# Patient Record
Sex: Female | Born: 1945 | Race: White | Hispanic: No | Marital: Married | Smoking: Former smoker
Health system: Southern US, Community
[De-identification: ages and names within clinical notes are randomized; demographics above are authoritative.]

## PROBLEM LIST (undated history)

## (undated) DIAGNOSIS — F419 Anxiety disorder, unspecified: Secondary | ICD-10-CM

## (undated) DIAGNOSIS — E785 Hyperlipidemia, unspecified: Secondary | ICD-10-CM

## (undated) DIAGNOSIS — N390 Urinary tract infection, site not specified: Secondary | ICD-10-CM

## (undated) DIAGNOSIS — C50919 Malignant neoplasm of unspecified site of unspecified female breast: Secondary | ICD-10-CM

## (undated) DIAGNOSIS — D649 Anemia, unspecified: Secondary | ICD-10-CM

## (undated) DIAGNOSIS — R739 Hyperglycemia, unspecified: Secondary | ICD-10-CM

## (undated) DIAGNOSIS — K219 Gastro-esophageal reflux disease without esophagitis: Secondary | ICD-10-CM

## (undated) DIAGNOSIS — R011 Cardiac murmur, unspecified: Secondary | ICD-10-CM

## (undated) DIAGNOSIS — T300 Burn of unspecified body region, unspecified degree: Secondary | ICD-10-CM

## (undated) DIAGNOSIS — C20 Malignant neoplasm of rectum: Secondary | ICD-10-CM

## (undated) DIAGNOSIS — C801 Malignant (primary) neoplasm, unspecified: Secondary | ICD-10-CM

## (undated) DIAGNOSIS — I10 Essential (primary) hypertension: Secondary | ICD-10-CM

## (undated) DIAGNOSIS — E039 Hypothyroidism, unspecified: Secondary | ICD-10-CM

## (undated) DIAGNOSIS — Z923 Personal history of irradiation: Secondary | ICD-10-CM

## (undated) DIAGNOSIS — Z9221 Personal history of antineoplastic chemotherapy: Secondary | ICD-10-CM

## (undated) DIAGNOSIS — E559 Vitamin D deficiency, unspecified: Secondary | ICD-10-CM

## (undated) DIAGNOSIS — E871 Hypo-osmolality and hyponatremia: Secondary | ICD-10-CM

## (undated) HISTORY — PX: BREAST EXCISIONAL BIOPSY: SUR124

## (undated) HISTORY — PX: TUBAL LIGATION: SHX77

## (undated) HISTORY — DX: Hyperlipidemia, unspecified: E78.5

## (undated) HISTORY — DX: Malignant neoplasm of unspecified site of unspecified female breast: C50.919

## (undated) HISTORY — DX: Hypothyroidism, unspecified: E03.9

## (undated) HISTORY — DX: Urinary tract infection, site not specified: N39.0

## (undated) HISTORY — DX: Hyperglycemia, unspecified: R73.9

## (undated) HISTORY — PX: ABDOMINAL HYSTERECTOMY: SHX81

## (undated) HISTORY — DX: Essential (primary) hypertension: I10

## (undated) HISTORY — PX: BREAST LUMPECTOMY: SHX2

## (undated) HISTORY — PX: APPENDECTOMY: SHX54

## (undated) HISTORY — DX: Anxiety disorder, unspecified: F41.9

## (undated) HISTORY — DX: Vitamin D deficiency, unspecified: E55.9

## (undated) HISTORY — PX: ECTOPIC PREGNANCY SURGERY: SHX613

---
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
## 1999-11-12 ENCOUNTER — Other Ambulatory Visit: Admission: RE | Admit: 1999-11-12 | Discharge: 1999-12-13 | Payer: Self-pay | Admitting: Family Medicine

## 2005-05-20 ENCOUNTER — Ambulatory Visit: Payer: Self-pay | Admitting: Family Medicine

## 2011-12-29 DIAGNOSIS — I10 Essential (primary) hypertension: Secondary | ICD-10-CM | POA: Diagnosis present

## 2011-12-29 DIAGNOSIS — E039 Hypothyroidism, unspecified: Secondary | ICD-10-CM | POA: Diagnosis present

## 2011-12-29 DIAGNOSIS — E782 Mixed hyperlipidemia: Secondary | ICD-10-CM | POA: Diagnosis present

## 2013-10-17 DIAGNOSIS — R739 Hyperglycemia, unspecified: Secondary | ICD-10-CM | POA: Insufficient documentation

## 2013-10-17 DIAGNOSIS — E559 Vitamin D deficiency, unspecified: Secondary | ICD-10-CM | POA: Diagnosis present

## 2015-12-19 DIAGNOSIS — I10 Essential (primary) hypertension: Secondary | ICD-10-CM | POA: Diagnosis not present

## 2015-12-19 DIAGNOSIS — E782 Mixed hyperlipidemia: Secondary | ICD-10-CM | POA: Diagnosis not present

## 2016-01-16 DIAGNOSIS — R030 Elevated blood-pressure reading, without diagnosis of hypertension: Secondary | ICD-10-CM | POA: Diagnosis not present

## 2016-01-16 DIAGNOSIS — E782 Mixed hyperlipidemia: Secondary | ICD-10-CM | POA: Diagnosis not present

## 2016-01-16 DIAGNOSIS — I10 Essential (primary) hypertension: Secondary | ICD-10-CM | POA: Diagnosis not present

## 2016-03-12 DIAGNOSIS — E785 Hyperlipidemia, unspecified: Secondary | ICD-10-CM | POA: Diagnosis not present

## 2016-04-29 DIAGNOSIS — Z1231 Encounter for screening mammogram for malignant neoplasm of breast: Secondary | ICD-10-CM | POA: Diagnosis not present

## 2016-05-19 DIAGNOSIS — D225 Melanocytic nevi of trunk: Secondary | ICD-10-CM | POA: Diagnosis not present

## 2016-05-19 DIAGNOSIS — L821 Other seborrheic keratosis: Secondary | ICD-10-CM | POA: Diagnosis not present

## 2016-06-08 DIAGNOSIS — E785 Hyperlipidemia, unspecified: Secondary | ICD-10-CM | POA: Diagnosis not present

## 2016-06-30 DIAGNOSIS — H5203 Hypermetropia, bilateral: Secondary | ICD-10-CM | POA: Diagnosis not present

## 2016-06-30 DIAGNOSIS — H25013 Cortical age-related cataract, bilateral: Secondary | ICD-10-CM | POA: Diagnosis not present

## 2016-06-30 DIAGNOSIS — H2513 Age-related nuclear cataract, bilateral: Secondary | ICD-10-CM | POA: Diagnosis not present

## 2016-09-22 DIAGNOSIS — Z23 Encounter for immunization: Secondary | ICD-10-CM | POA: Diagnosis not present

## 2016-09-22 DIAGNOSIS — L820 Inflamed seborrheic keratosis: Secondary | ICD-10-CM | POA: Diagnosis not present

## 2016-09-22 DIAGNOSIS — D485 Neoplasm of uncertain behavior of skin: Secondary | ICD-10-CM | POA: Diagnosis not present

## 2016-12-28 DIAGNOSIS — Z1159 Encounter for screening for other viral diseases: Secondary | ICD-10-CM | POA: Diagnosis not present

## 2016-12-28 DIAGNOSIS — E782 Mixed hyperlipidemia: Secondary | ICD-10-CM | POA: Diagnosis not present

## 2016-12-28 DIAGNOSIS — E039 Hypothyroidism, unspecified: Secondary | ICD-10-CM | POA: Diagnosis not present

## 2016-12-28 DIAGNOSIS — E559 Vitamin D deficiency, unspecified: Secondary | ICD-10-CM | POA: Diagnosis not present

## 2016-12-28 DIAGNOSIS — I10 Essential (primary) hypertension: Secondary | ICD-10-CM | POA: Diagnosis not present

## 2016-12-31 DIAGNOSIS — E782 Mixed hyperlipidemia: Secondary | ICD-10-CM | POA: Diagnosis not present

## 2016-12-31 DIAGNOSIS — E039 Hypothyroidism, unspecified: Secondary | ICD-10-CM | POA: Diagnosis not present

## 2016-12-31 DIAGNOSIS — Z1159 Encounter for screening for other viral diseases: Secondary | ICD-10-CM | POA: Diagnosis not present

## 2016-12-31 DIAGNOSIS — I10 Essential (primary) hypertension: Secondary | ICD-10-CM | POA: Diagnosis not present

## 2016-12-31 DIAGNOSIS — Z0000 Encounter for general adult medical examination without abnormal findings: Secondary | ICD-10-CM | POA: Diagnosis not present

## 2017-01-17 DIAGNOSIS — Z1211 Encounter for screening for malignant neoplasm of colon: Secondary | ICD-10-CM | POA: Diagnosis not present

## 2017-05-17 DIAGNOSIS — Z1231 Encounter for screening mammogram for malignant neoplasm of breast: Secondary | ICD-10-CM | POA: Diagnosis not present

## 2017-05-18 DIAGNOSIS — L821 Other seborrheic keratosis: Secondary | ICD-10-CM | POA: Diagnosis not present

## 2017-05-18 DIAGNOSIS — D225 Melanocytic nevi of trunk: Secondary | ICD-10-CM | POA: Diagnosis not present

## 2017-05-26 DIAGNOSIS — R928 Other abnormal and inconclusive findings on diagnostic imaging of breast: Secondary | ICD-10-CM | POA: Diagnosis not present

## 2017-05-26 DIAGNOSIS — N6313 Unspecified lump in the right breast, lower outer quadrant: Secondary | ICD-10-CM | POA: Diagnosis not present

## 2017-06-01 ENCOUNTER — Other Ambulatory Visit: Payer: Self-pay

## 2017-06-01 DIAGNOSIS — R5381 Other malaise: Secondary | ICD-10-CM

## 2017-06-03 ENCOUNTER — Other Ambulatory Visit: Payer: Self-pay | Admitting: Nurse Practitioner

## 2017-06-03 DIAGNOSIS — R5381 Other malaise: Secondary | ICD-10-CM

## 2017-12-19 DIAGNOSIS — R928 Other abnormal and inconclusive findings on diagnostic imaging of breast: Secondary | ICD-10-CM | POA: Diagnosis not present

## 2017-12-19 DIAGNOSIS — N6489 Other specified disorders of breast: Secondary | ICD-10-CM | POA: Diagnosis not present

## 2017-12-29 DIAGNOSIS — Z0000 Encounter for general adult medical examination without abnormal findings: Secondary | ICD-10-CM | POA: Diagnosis not present

## 2018-01-03 DIAGNOSIS — I10 Essential (primary) hypertension: Secondary | ICD-10-CM | POA: Diagnosis not present

## 2018-01-03 DIAGNOSIS — Z1211 Encounter for screening for malignant neoplasm of colon: Secondary | ICD-10-CM | POA: Diagnosis not present

## 2018-01-03 DIAGNOSIS — Z0000 Encounter for general adult medical examination without abnormal findings: Secondary | ICD-10-CM | POA: Diagnosis not present

## 2018-01-03 DIAGNOSIS — E782 Mixed hyperlipidemia: Secondary | ICD-10-CM | POA: Diagnosis not present

## 2018-01-03 DIAGNOSIS — E039 Hypothyroidism, unspecified: Secondary | ICD-10-CM | POA: Diagnosis not present

## 2018-04-20 ENCOUNTER — Other Ambulatory Visit: Payer: Self-pay | Admitting: Nurse Practitioner

## 2018-04-20 DIAGNOSIS — E2839 Other primary ovarian failure: Secondary | ICD-10-CM

## 2018-05-30 DIAGNOSIS — L821 Other seborrheic keratosis: Secondary | ICD-10-CM | POA: Diagnosis not present

## 2018-05-30 DIAGNOSIS — D235 Other benign neoplasm of skin of trunk: Secondary | ICD-10-CM | POA: Diagnosis not present

## 2018-05-30 DIAGNOSIS — D225 Melanocytic nevi of trunk: Secondary | ICD-10-CM | POA: Diagnosis not present

## 2018-05-31 ENCOUNTER — Ambulatory Visit
Admission: RE | Admit: 2018-05-31 | Discharge: 2018-05-31 | Disposition: A | Discharge status: Home / Self Care | Payer: PPO | Source: Ambulatory Visit | Attending: Nurse Practitioner | Admitting: Nurse Practitioner

## 2018-05-31 DIAGNOSIS — E2839 Other primary ovarian failure: Secondary | ICD-10-CM

## 2018-05-31 DIAGNOSIS — M8588 Other specified disorders of bone density and structure, other site: Secondary | ICD-10-CM | POA: Diagnosis not present

## 2018-05-31 DIAGNOSIS — Z780 Asymptomatic menopausal state: Secondary | ICD-10-CM | POA: Diagnosis not present

## 2018-06-28 DIAGNOSIS — E782 Mixed hyperlipidemia: Secondary | ICD-10-CM | POA: Diagnosis not present

## 2018-06-28 DIAGNOSIS — E039 Hypothyroidism, unspecified: Secondary | ICD-10-CM | POA: Diagnosis not present

## 2018-06-28 DIAGNOSIS — I10 Essential (primary) hypertension: Secondary | ICD-10-CM | POA: Diagnosis not present

## 2018-07-04 DIAGNOSIS — E039 Hypothyroidism, unspecified: Secondary | ICD-10-CM | POA: Diagnosis not present

## 2018-07-04 DIAGNOSIS — E782 Mixed hyperlipidemia: Secondary | ICD-10-CM | POA: Diagnosis not present

## 2018-07-04 DIAGNOSIS — I10 Essential (primary) hypertension: Secondary | ICD-10-CM | POA: Diagnosis not present

## 2018-07-05 ENCOUNTER — Other Ambulatory Visit: Payer: Self-pay | Admitting: Nurse Practitioner

## 2018-07-05 DIAGNOSIS — Z1231 Encounter for screening mammogram for malignant neoplasm of breast: Secondary | ICD-10-CM

## 2018-07-27 ENCOUNTER — Encounter: Payer: Self-pay | Admitting: Radiology

## 2018-07-27 ENCOUNTER — Ambulatory Visit
Admission: RE | Admit: 2018-07-27 | Discharge: 2018-07-27 | Disposition: A | Discharge status: Home / Self Care | Payer: PPO | Source: Ambulatory Visit | Attending: Nurse Practitioner | Admitting: Nurse Practitioner

## 2018-07-27 DIAGNOSIS — Z1231 Encounter for screening mammogram for malignant neoplasm of breast: Secondary | ICD-10-CM | POA: Diagnosis not present

## 2018-12-08 DIAGNOSIS — J029 Acute pharyngitis, unspecified: Secondary | ICD-10-CM | POA: Diagnosis not present

## 2018-12-08 DIAGNOSIS — R6889 Other general symptoms and signs: Secondary | ICD-10-CM | POA: Diagnosis not present

## 2018-12-08 DIAGNOSIS — B9789 Other viral agents as the cause of diseases classified elsewhere: Secondary | ICD-10-CM | POA: Diagnosis not present

## 2018-12-08 DIAGNOSIS — J069 Acute upper respiratory infection, unspecified: Secondary | ICD-10-CM | POA: Diagnosis not present

## 2018-12-29 DIAGNOSIS — I10 Essential (primary) hypertension: Secondary | ICD-10-CM | POA: Diagnosis not present

## 2018-12-29 DIAGNOSIS — R739 Hyperglycemia, unspecified: Secondary | ICD-10-CM | POA: Diagnosis not present

## 2018-12-29 DIAGNOSIS — E782 Mixed hyperlipidemia: Secondary | ICD-10-CM | POA: Diagnosis not present

## 2018-12-29 DIAGNOSIS — E039 Hypothyroidism, unspecified: Secondary | ICD-10-CM | POA: Diagnosis not present

## 2019-01-04 DIAGNOSIS — E039 Hypothyroidism, unspecified: Secondary | ICD-10-CM | POA: Diagnosis not present

## 2019-01-04 DIAGNOSIS — R195 Other fecal abnormalities: Secondary | ICD-10-CM | POA: Diagnosis not present

## 2019-01-04 DIAGNOSIS — Z0000 Encounter for general adult medical examination without abnormal findings: Secondary | ICD-10-CM | POA: Diagnosis not present

## 2019-01-04 DIAGNOSIS — E782 Mixed hyperlipidemia: Secondary | ICD-10-CM | POA: Diagnosis not present

## 2019-01-04 DIAGNOSIS — I10 Essential (primary) hypertension: Secondary | ICD-10-CM | POA: Diagnosis not present

## 2019-01-08 DIAGNOSIS — R195 Other fecal abnormalities: Secondary | ICD-10-CM | POA: Diagnosis not present

## 2019-01-31 ENCOUNTER — Ambulatory Visit (INDEPENDENT_AMBULATORY_CARE_PROVIDER_SITE_OTHER): Payer: PPO | Admitting: Gastroenterology

## 2019-01-31 ENCOUNTER — Encounter: Payer: Self-pay | Admitting: Gastroenterology

## 2019-01-31 ENCOUNTER — Ambulatory Visit: Payer: PPO | Admitting: Gastroenterology

## 2019-01-31 VITALS — BP 184/76 | HR 100 | Ht 67.0 in | Wt 147.6 lb

## 2019-01-31 DIAGNOSIS — R195 Other fecal abnormalities: Secondary | ICD-10-CM

## 2019-01-31 MED ORDER — PEG 3350-KCL-NA BICARB-NACL 420 G PO SOLR: 4000.0000 mL | ORAL | 0 refills | Status: DC

## 2019-01-31 NOTE — Patient Instructions (Addendum)
You will be set up for a colonoscopy for FOBT + stools.  Thank you for entrusting me with your care and choosing [facility].  Dr Ardis Hughs

## 2019-01-31 NOTE — Progress Notes (Signed)
HPI: This is a very pleasant 73 year old woman who was referred to me by Chesley Noon, MD  to evaluate Hemoccult positive stool.    Chief complaint is Hemoccult positive stool  She has mild chronic constipation for which she takes magnesium pill once daily.  This is been going on for many years.  Might have been a little bit worse over the holidays.  She never sees overt bleeding.  On a recent annual physical she was found to have Hemoccult positive stool.  She has no significant abdominal pains.  Her weight has been overall stable.  Colon cancer does not run in her family  Old Data Reviewed: Blood test January 2020 show normal CBC, normal complete metabolic profile  Fecal occult blood test January 2020 was positive.    Review of systems: Pertinent positive and negative review of systems were noted in the above HPI section. All other review negative.   Past Medical History:  Diagnosis Date  . Benign essential HTN   . Hyperglycemia   . Hyperlipemia   . Hypothyroidism   . Vitamin D deficiency   . White coat syndrome with diagnosis of hypertension     Past Surgical History:  Procedure Laterality Date  . BREAST EXCISIONAL BIOPSY Bilateral    benign    Current Outpatient Medications  Medication Sig Dispense Refill  . amLODipine (NORVASC) 5 MG tablet Take 5 mg by mouth daily.    . Ascorbic Acid (VITAMIN C) 100 MG tablet Take 100 mg by mouth daily.    Marland Kitchen aspirin EC 81 MG tablet Take 81 mg by mouth daily.    . Biotin 1 MG CAPS Take by mouth.    . calcium-vitamin D (OSCAL WITH D) 500-200 MG-UNIT tablet Take 1 tablet by mouth.    . folic acid (FOLVITE) 1 MG tablet Take 1 mg by mouth daily.    Marland Kitchen L-Theanine 100 MG CAPS Take by mouth.    . levothyroxine (SYNTHROID, LEVOTHROID) 125 MCG tablet Take 125 mcg by mouth daily before breakfast.    . losartan (COZAAR) 100 MG tablet Take 100 mg by mouth daily.    . magnesium 30 MG tablet Take 30 mg by mouth 2 (two) times daily.     No  current facility-administered medications for this visit.     Allergies as of 01/31/2019 - Review Complete 01/31/2019  Allergen Reaction Noted  . Metoprolol  01/26/2019  . Statins  01/26/2019    Family History  Problem Relation Age of Onset  . Lung cancer Mother   . Diabetes Father   . Heart disease Father     Social History   Socioeconomic History  . Marital status: Married    Spouse name: Not on file  . Number of children: 1  . Years of education: Not on file  . Highest education level: Not on file  Occupational History  . Occupation: Retired Animal nutritionist  . Financial resource strain: Not on file  . Food insecurity:    Worry: Not on file    Inability: Not on file  . Transportation needs:    Medical: Not on file    Non-medical: Not on file  Tobacco Use  . Smoking status: Never Smoker  . Smokeless tobacco: Never Used  Substance and Sexual Activity  . Alcohol use: Yes    Alcohol/week: 7.0 standard drinks    Types: 7 Glasses of wine per week  . Drug use: Never  . Sexual activity: Not on file  Lifestyle  . Physical activity:    Days per week: Not on file    Minutes per session: Not on file  . Stress: Not on file  Relationships  . Social connections:    Talks on phone: Not on file    Gets together: Not on file    Attends religious service: Not on file    Active member of club or organization: Not on file    Attends meetings of clubs or organizations: Not on file    Relationship status: Not on file  . Intimate partner violence:    Fear of current or ex partner: Not on file    Emotionally abused: Not on file    Physically abused: Not on file    Forced sexual activity: Not on file  Other Topics Concern  . Not on file  Social History Narrative  . Not on file     Physical Exam: BP (!) 184/76   Pulse 100   Ht 5\' 7"  (1.702 m)   Wt 147 lb 9.6 oz (67 kg)   BMI 23.12 kg/m  Constitutional: generally well-appearing Psychiatric: alert and oriented  x3 Eyes: extraocular movements intact Mouth: oral pharynx moist, no lesions Neck: supple no lymphadenopathy Cardiovascular: heart regular rate and rhythm Lungs: clear to auscultation bilaterally Abdomen: soft, nontender, nondistended, no obvious ascites, no peritoneal signs, normal bowel sounds Extremities: no lower extremity edema bilaterally Skin: no lesions on visible extremities   Assessment and plan: 73 y.o. female with Hemoccult positive stool  She is not anemic, she has not been losing weight.  She does not have a family history of colon cancer.  I think it is unlikely that she has any serious cause of her Hemoccult positive stool but certainly she deserves further work-up with a full colonoscopy at her soonest convenience.  I see no reason for any further blood tests or imaging studies prior to then.    Please see the "Patient Instructions" section for addition details about the plan.   Owens Loffler, MD [facility] 01/31/2019, 11:23 AM  Cc: Chesley Noon, MD

## 2019-02-11 HISTORY — PX: COLONOSCOPY: SHX174

## 2019-02-27 ENCOUNTER — Telehealth: Payer: Self-pay | Admitting: *Deleted

## 2019-02-27 NOTE — Telephone Encounter (Signed)
Covid-19 travel screening questions  Have you traveled in the last 14 days? No If yes where?  Do you now or have you had a fever in the last 14 days? No  Do you have any respiratory symptoms of shortness of breath or cough now or in the last 14 days? No  Do you have any family members or close contacts with diagnosed or suspected Covid-19? No       

## 2019-02-28 ENCOUNTER — Ambulatory Visit (AMBULATORY_SURGERY_CENTER): Payer: PPO | Admitting: Gastroenterology

## 2019-02-28 ENCOUNTER — Other Ambulatory Visit: Payer: Self-pay | Admitting: Gastroenterology

## 2019-02-28 ENCOUNTER — Encounter: Payer: Self-pay | Admitting: Gastroenterology

## 2019-02-28 ENCOUNTER — Other Ambulatory Visit (INDEPENDENT_AMBULATORY_CARE_PROVIDER_SITE_OTHER): Payer: PPO

## 2019-02-28 ENCOUNTER — Other Ambulatory Visit: Payer: Self-pay

## 2019-02-28 ENCOUNTER — Other Ambulatory Visit: Payer: PPO

## 2019-02-28 VITALS — BP 199/94 | HR 81 | Temp 97.8°F | Resp 12 | Ht 67.0 in | Wt 147.0 lb

## 2019-02-28 DIAGNOSIS — D128 Benign neoplasm of rectum: Secondary | ICD-10-CM

## 2019-02-28 DIAGNOSIS — D129 Benign neoplasm of anus and anal canal: Secondary | ICD-10-CM

## 2019-02-28 DIAGNOSIS — C20 Malignant neoplasm of rectum: Secondary | ICD-10-CM

## 2019-02-28 DIAGNOSIS — R195 Other fecal abnormalities: Secondary | ICD-10-CM | POA: Diagnosis not present

## 2019-02-28 LAB — CREATININE, SERUM: Creatinine, Ser: 0.51 mg/dL (ref 0.40–1.20)

## 2019-02-28 LAB — BUN: BUN: 8 mg/dL (ref 6–23)

## 2019-02-28 MED ORDER — SODIUM CHLORIDE 0.9 % IV SOLN: 500.0000 mL | Freq: Once | INTRAVENOUS | Status: DC

## 2019-02-28 NOTE — Progress Notes (Signed)
To PACU, VSS. Report to [email or web address] 

## 2019-02-28 NOTE — Progress Notes (Signed)
Called to room to assist during endoscopic procedure.  Patient ID and intended procedure confirmed with present staff. Received instructions for my participation in the procedure from the performing physician.  

## 2019-02-28 NOTE — Patient Instructions (Signed)
Creatinine order put in, patient going to have drawn before d/c, sent home with CT info and 2 bottles of contrast  YOU HAD AN ENDOSCOPIC PROCEDURE TODAY AT [facility] [place]:   Refer to the procedure report that was given to you for any specific questions about what was found during the examination.  If the procedure report does not answer your questions, please call your gastroenterologist to clarify.  If you requested that your care partner not be given the details of your procedure findings, then the procedure report has been included in a sealed envelope for you to review at your convenience later.  YOU SHOULD EXPECT: Some feelings of bloating in the abdomen. Passage of more gas than usual.  Walking can help get rid of the air that was put into your GI tract during the procedure and reduce the bloating. If you had a lower endoscopy (such as a colonoscopy or flexible sigmoidoscopy) you may notice spotting of blood in your stool or on the toilet paper. If you underwent a bowel prep for your procedure, you may not have a normal bowel movement for a few days.  Please Note:  You might notice some irritation and congestion in your nose or some drainage.  This is from the oxygen used during your procedure.  There is no need for concern and it should clear up in a day or so.  SYMPTOMS TO REPORT IMMEDIATELY:   Following lower endoscopy (colonoscopy or flexible sigmoidoscopy):  Excessive amounts of blood in the stool  Significant tenderness or worsening of abdominal pains  Swelling of the abdomen that is new, acute  Fever of 100F or higher  For urgent or emergent issues, a gastroenterologist can be reached at any hour by calling [phone].   DIET:  We do recommend a small meal at first, but then you may proceed to your regular diet.  Drink plenty of fluids but you should avoid alcoholic beverages for 24 hours.  ACTIVITY:  You should plan to take it easy for the rest of today and you  should NOT DRIVE or use heavy machinery until tomorrow (because of the sedation medicines used during the test).    FOLLOW UP: Our staff will call the number listed on your records the next business day following your procedure to check on you and address any questions or concerns that you may have regarding the information given to you following your procedure. If we do not reach you, we will leave a message.  However, if you are feeling well and you are not experiencing any problems, there is no need to return our call.  We will assume that you have returned to your regular daily activities without incident.  If any biopsies were taken you will be contacted by phone or by letter within the next 1-3 weeks.  Please call us at [phone] if you have not heard about the biopsies in 3 weeks.    SIGNATURES/CONFIDENTIALITY: You and/or your care partner have signed paperwork which will be entered into your electronic medical record.  These signatures attest to the fact that that the information above on your After Visit Summary has been reviewed and is understood.  Full responsibility of the confidentiality of this discharge information lies with you and/or your care-partner.

## 2019-02-28 NOTE — Op Note (Signed)
[facility] Patient Name: Kathleen Flores Procedure Date: 02/28/2019 10:46 AM MRN: [id number] Endoscopist: Milus Banister , MD Age: 73 Referring MD:  Date of Birth: 03/24/1946 Gender: Female Account #: [id number] Procedure:                Colonoscopy Indications:              Heme positive stool Medicines:                Monitored Anesthesia Care Procedure:                Pre-Anesthesia Assessment:                           - Prior to the procedure, a History and Physical                            was performed, and patient medications and                            allergies were reviewed. The patient's tolerance of                            previous anesthesia was also reviewed. The risks                            and benefits of the procedure and the sedation                            options and risks were discussed with the patient.                            All questions were answered, and informed consent                            was obtained. Prior Anticoagulants: The patient has                            taken no previous anticoagulant or antiplatelet                            agents. ASA Grade Assessment: II - A patient with                            mild systemic disease. After reviewing the risks                            and benefits, the patient was deemed in                            satisfactory condition to undergo the procedure.                           After obtaining informed consent, the colonoscope  was passed under direct vision. Throughout the                            procedure, the patient's blood pressure, pulse, and                            oxygen saturations were monitored continuously. The                            Colonoscope was introduced through the anus and                            advanced to the the cecum, identified by                            appendiceal orifice and ileocecal valve. The                          colonoscopy was performed without difficulty. The                            patient tolerated the procedure well. The quality                            of the bowel preparation was good. The ileocecal                            valve, appendiceal orifice, and rectum were                            photographed. Scope In: 10:52:45 AM Scope Out: 70:62:37 AM Scope Withdrawal Time: 0 hours 15 minutes 6 seconds  Total Procedure Duration: 0 hours 23 minutes 33 seconds  Findings:                 An ulcerated non-obstructing small mass was found                            in the mid rectum. The mass was partially                            circumferential (1/4 of the lumen circumference).                            The mass measured 2cm across and the distal edge is                            located 7cm from the anal verge. Biopsies were                            taken with a cold forceps for histology. Area was                            tattooed with an injection of Spot (carbon black)  in two locations immediately lateral to the lesion.                           The exam was otherwise without abnormality on                            direct and retroflexion views. Complications:            No immediate complications. Estimated blood loss:                            None. Estimated Blood Loss:     Estimated blood loss: none. 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 This was biopsied extensively and                            the site was labeled with SPOT injection.                           - The examination was otherwise normal on direct                            and retroflexion views. Recommendation:           - Patient has a contact number available for                            emergencies. The signs and symptoms of potential                             delayed complications were discussed with the                            patient. Return to normal activities tomorrow.                            Written discharge instructions were provided to the                            patient.                           - Resume previous diet.                           - Continue present medications.                           - Repeat colonoscopy in 1 year for surveillance.                           - My office will arrange staging workup (CT scan  chest/abd/pelvis with IV and oral contrast, CEA                            level) and will begin referral to [facility] to                            consider resection.                           - Pathology sent 'rush' Milus Banister, MD 02/28/2019 11:23:24 AM This report has been signed electronically.

## 2019-03-01 ENCOUNTER — Telehealth: Payer: Self-pay | Admitting: *Deleted

## 2019-03-01 NOTE — Telephone Encounter (Signed)
  Follow up Call-  Call back number 02/28/2019  Post procedure Call Back phone  # [phone]  Permission to leave phone message Yes  Some recent data might be hidden     Patient questions:  Do you have a fever, pain , or abdominal swelling? No. Pain Score  0 *  Have you tolerated food without any problems? Yes.    Have you been able to return to your normal activities? Yes.    Do you have any questions about your discharge instructions: Diet   No. Medications  No. Follow up visit  No.  Do you have questions or concerns about your Care? No.  Actions: * If pain score is 4 or above: No action needed, pain <4.  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 She requested me to have that noted.  I thanked her

## 2019-03-02 ENCOUNTER — Other Ambulatory Visit: Payer: PPO

## 2019-03-02 ENCOUNTER — Encounter: Payer: Self-pay | Admitting: Gastroenterology

## 2019-03-02 ENCOUNTER — Other Ambulatory Visit: Payer: Self-pay

## 2019-03-02 ENCOUNTER — Telehealth: Payer: Self-pay | Admitting: Gastroenterology

## 2019-03-02 DIAGNOSIS — C20 Malignant neoplasm of rectum: Secondary | ICD-10-CM

## 2019-03-02 NOTE — Telephone Encounter (Signed)
Pt called back said that a friend of her's recommended Dr. Georgette Dover or Dr. Rosendo Gros from [facility].

## 2019-03-02 NOTE — Telephone Encounter (Signed)
Following a telephone conversation with Dr. Ardis Hughs, pt informed that she is in network with [facility].

## 2019-03-02 NOTE — Telephone Encounter (Signed)
error 

## 2019-03-05 ENCOUNTER — Encounter (INDEPENDENT_AMBULATORY_CARE_PROVIDER_SITE_OTHER): Payer: Self-pay

## 2019-03-05 ENCOUNTER — Other Ambulatory Visit: Payer: Self-pay

## 2019-03-05 ENCOUNTER — Ambulatory Visit (INDEPENDENT_AMBULATORY_CARE_PROVIDER_SITE_OTHER)
Admission: RE | Admit: 2019-03-05 | Discharge: 2019-03-05 | Disposition: A | Discharge status: Home / Self Care | Payer: PPO | Source: Ambulatory Visit | Attending: Gastroenterology | Admitting: Gastroenterology

## 2019-03-05 DIAGNOSIS — R918 Other nonspecific abnormal finding of lung field: Secondary | ICD-10-CM | POA: Diagnosis not present

## 2019-03-05 DIAGNOSIS — C20 Malignant neoplasm of rectum: Secondary | ICD-10-CM

## 2019-03-05 LAB — CEA: CEA: 0.8 ng/mL

## 2019-03-05 MED ORDER — IOHEXOL 300 MG/ML  SOLN
100.0000 mL | Freq: Once | INTRAMUSCULAR | Status: AC | PRN
  Administered 2019-03-05: 100 mL via INTRAVENOUS

## 2019-03-05 NOTE — Telephone Encounter (Signed)
noted 

## 2019-03-05 NOTE — Telephone Encounter (Signed)
Pt called would like for you to know that Dr. Michael Boston is going to be her surgeon at [facility] and if any notes can be sent to him.

## 2019-03-06 ENCOUNTER — Ambulatory Visit: Payer: Self-pay | Admitting: Surgery

## 2019-03-06 DIAGNOSIS — C20 Malignant neoplasm of rectum: Secondary | ICD-10-CM | POA: Diagnosis not present

## 2019-03-06 NOTE — H&P (Signed)
[name] Documented: 03/06/2019 8:43 AM Location: [facility] Patient #: [id number] DOB: 04/20/1946 Married / Language: [name] / Race: White Female  History of Present Illness Adin Hector MD; 03/06/2019 9:35 AM) The patient is a 73 year old female who presents with colorectal cancer. Note for "Colorectal cancer": ` ` ` Patient sent for surgical consultation at the request of Dr Dalene Carrow GI  Chief Complaint: Rectal cancer. ` ` The patient is a pleasant active woman that was found to be Hemoccult positive on annual visit. Underwent colonoscopy and found to have a mass in the mid rectum. Biopsy consistent with adenocarcinoma. Surgical consultation requested. Patient comes today by herself. No family history of any colorectal issues. She usually moves her bowels every day. She did have some constipation around the holidays but not an issue now. No frank bleeding. She's had a tubal ligation as well as a hysterectomy in the distant past. She thinks she might have had an appendectomy as well. She walks twice a day for 30 minutes at a time. She does have some hypertension and "white coat syndrome". However no cardiovascular issues. She does not smoke. She is not diabetic. With the diagnosis, her brother is getting a colonoscopy as well. Her mother passed away from bone cancer in 2004. No other family history of cancer that she recalls.   No personal nor family history of GI/colon cancer, inflammatory bowel disease, irritable bowel syndrome, allergy such as Celiac Sprue, dietary/dairy problems, colitis, ulcers nor gastritis. No recent sick contacts/gastroenteritis. No travel outside the country. No changes in diet. No dysphagia to solids or liquids. No significant heartburn or reflux. No hematochezia, hematemesis, coffee ground emesis. No evidence of prior gastric/peptic ulceration.  (Review of systems as stated in this history (HPI) or in the review  of systems. Otherwise all other 12 point ROS are negative) ` ` `   Past Surgical History Geni Bers [place], RMA; 03/06/2019 8:44 AM) Appendectomy Hysterectomy (not due to cancer) - Complete  Diagnostic Studies History [facility], RMA; 03/06/2019 8:44 AM) Mammogram within last year  Allergies Geni Bers [place], RMA; 03/06/2019 8:45 AM) Toprol XL *BETA BLOCKERS* Swollen lips. Allergies Reconciled  Medication History [employer], RMA; 03/06/2019 8:46 AM) amLODIPine Besylate (Oral) Specific strength unknown - Active. Losartan Potassium (Oral) Specific strength unknown - Active. Synthroid (125MCG Tablet, Oral) Active. Vitamin C (Oral) Specific strength unknown - Active. Vitamin D3 (Oral) Specific strength unknown - Active. Calcium (Oral) Specific strength unknown - Active. Zinc (Oral) Specific strength unknown - Active. Elderberry (Oral) Specific strength unknown - Active. Biotin (Oral) Specific strength unknown - Active. Medications Reconciled  Social History Geni Bers [place], RMA; 03/06/2019 8:44 AM) Alcohol use Moderate alcohol use. Caffeine use Carbonated beverages. No drug use Tobacco use Former smoker.  Family History Geni Bers [place], RMA; 03/06/2019 8:44 AM) Alcohol Abuse Father. Cancer Mother. Heart Disease Father. Heart disease in female family member before age 17 Hypertension Mother.  Pregnancy / Birth History Geni Bers [place], RMA; 03/06/2019 8:44 AM) Age at menarche 36 years. Age of menopause 63-55 Gravida 1 Maternal age 77-25 Para 42  Other Problems Geni Bers [place], RMA; 03/06/2019 8:44 AM) High blood pressure Hypercholesterolemia Lump In Breast Oophorectomy Bilateral. Thyroid Disease     Review of Systems Geni Bers [place] RMA; 03/06/2019 8:44 AM) General Not Present- Appetite Loss, Chills, Fatigue, Fever, Night Sweats, Weight Gain and Weight Loss. Skin Not Present- Change in Wart/Mole,  Dryness, Hives, Jaundice, New Lesions, Non-Healing Wounds, Rash and Ulcer. HEENT Present- Wears glasses/contact lenses. Not Present-  Earache, Hearing Loss, Hoarseness, Nose Bleed, Oral Ulcers, Ringing in the Ears, Seasonal Allergies, Sinus Pain, Sore Throat, Visual Disturbances and Yellow Eyes. Respiratory Not Present- Bloody sputum, Chronic Cough, Difficulty Breathing, Snoring and Wheezing. Breast Not Present- Breast Mass, Breast Pain, Nipple Discharge and Skin Changes. Cardiovascular Not Present- Chest Pain, Difficulty Breathing Lying Down, Leg Cramps, Palpitations, Rapid Heart Rate, Shortness of Breath and Swelling of Extremities. Gastrointestinal Not Present- Abdominal Pain, Bloating, Bloody Stool, Change in Bowel Habits, Chronic diarrhea, Constipation, Difficulty Swallowing, Excessive gas, Gets full quickly at meals, Hemorrhoids, Indigestion, Nausea, Rectal Pain and Vomiting. Female Genitourinary Not Present- Frequency, Nocturia, Painful Urination, Pelvic Pain and Urgency. Musculoskeletal Not Present- Back Pain, Joint Pain, Joint Stiffness, Muscle Pain, Muscle Weakness and Swelling of Extremities. Neurological Not Present- Decreased Memory, Fainting, Headaches, Numbness, Seizures, Tingling, Tremor, Trouble walking and Weakness. Endocrine Not Present- Cold Intolerance, Excessive Hunger, Hair Changes, Heat Intolerance, Hot flashes and New Diabetes. Hematology Not Present- Blood Thinners, Easy Bruising, Excessive bleeding, Gland problems, HIV and Persistent Infections.  Vitals Geni Bers [place] RMA; 03/06/2019 8:47 AM) 03/06/2019 8:46 AM Weight: 147.2 lb Height: 67in Body Surface Area: 1.78 m Body Mass Index: 23.05 kg/m  Temp.: 98.92F(Temporal)  Pulse: 106 (Regular)  P.OX: 98% (Room air) BP: 180/92 (Sitting, Right Arm, Standard)      Physical Exam Adin Hector MD; 03/06/2019 9:32 AM)  General Mental Status-Alert. General Appearance-Not in acute distress, Not  Sickly. Orientation-Oriented X3. Hydration-Well hydrated. Voice-Normal. Note: Tearful but consolable. Moves around easily.  Integumentary Global Assessment Upon inspection and palpation of skin surfaces of the - Axillae: non-tender, no inflammation or ulceration, no drainage. and Distribution of scalp and body hair is normal. General Characteristics Temperature - normal warmth is noted.  Head and Neck Head-normocephalic, atraumatic with no lesions or palpable masses. Face Global Assessment - atraumatic, no absence of expression. Neck Global Assessment - no abnormal movements, no bruit auscultated on the right, no bruit auscultated on the left, no decreased range of motion, non-tender. Trachea-midline. Thyroid Gland Characteristics - non-tender.  Eye Eyeball - Left-Extraocular movements intact, No Nystagmus. Eyeball - Right-Extraocular movements intact, No Nystagmus. Cornea - Left-No Hazy. Cornea - Right-No Hazy. Sclera/Conjunctiva - Left-No scleral icterus, No Discharge. Sclera/Conjunctiva - Right-No scleral icterus, No Discharge. Pupil - Left-Direct reaction to light normal. Pupil - Right-Direct reaction to light normal.  ENMT Ears Pinna - Left - no drainage observed, no generalized tenderness observed. Right - no drainage observed, no generalized tenderness observed. Nose and Sinuses External Inspection of the Nose - no destructive lesion observed. Inspection of the nares - Left - quiet respiration. Right - quiet respiration. Mouth and Throat Lips - Upper Lip - no fissures observed, no pallor noted. Lower Lip - no fissures observed, no pallor noted. Nasopharynx - no discharge present. Oral Cavity/Oropharynx - Tongue - no dryness observed. Oral Mucosa - no cyanosis observed. Hypopharynx - no evidence of airway distress observed.  Chest and Lung Exam Inspection Movements - Normal and Symmetrical. Accessory muscles - No use of accessory muscles in  breathing. Palpation Palpation of the chest reveals - Non-tender. Auscultation Breath sounds - Normal and Clear.  Cardiovascular Auscultation Rhythm - Regular. Murmurs & Other Heart Sounds - Auscultation of the heart reveals - No Murmurs and No Systolic Clicks.  Abdomen Inspection Inspection of the abdomen reveals - No Visible peristalsis and No Abnormal pulsations. Umbilicus - No Bleeding, No Urine drainage. Palpation/Percussion Palpation and Percussion of the abdomen reveal - Soft, Non Tender, No Rebound tenderness, No Rigidity (  guarding) and No Cutaneous hyperesthesia. Note: Abdomen soft. Small periumbilical incision consistent with prior tubal ligation. Not distended. No distasis recti. No umbilical or other anterior abdominal wall hernias  Female Genitourinary Sexual Maturity Tanner 5 - Adult hair pattern. Note: No vaginal bleeding nor discharge  Rectal Note: Firm mass felt posterior midline around 8 cm from sphincters. Mildly sensitive but mobile. Involving about 20% of the circumference. Too high to be seen by anoscopy.  Perianal skin clear. Small anterior midline tag. Normal sphincter tone. Tolerated digital and anoscopic exam. No fissure. No abscess. No fistula. No pilonidal disease. No condyloma. Grade 1 internal hemorrhoids without bleeding or prolapse. Sphincter tone normal and intact. Soft stool in rectal vault.  Peripheral Vascular Upper Extremity Inspection - Left - No Cyanotic nailbeds, Not Ischemic. Right - No Cyanotic nailbeds, Not Ischemic.  Neurologic Neurologic evaluation reveals -normal attention span and ability to concentrate, able to name objects and repeat phrases. Appropriate fund of knowledge , normal sensation and normal coordination. Mental Status Affect - not angry, not paranoid. Cranial Nerves-Normal Bilaterally. Gait-Normal.  Neuropsychiatric Mental status exam performed with findings of-able to articulate well with normal  speech/language, rate, volume and coherence, thought content normal with ability to perform basic computations and apply abstract reasoning and no evidence of hallucinations, delusions, obsessions or homicidal/suicidal ideation. Note: Anxious but consolable  Musculoskeletal Global Assessment Spine, Ribs and Pelvis - no instability, subluxation or laxity. Right Upper Extremity - no instability, subluxation or laxity.  Lymphatic Head & Neck  General Head & Neck Lymphatics: Bilateral - Description - No Localized lymphadenopathy. Axillary  General Axillary Region: Bilateral - Description - No Localized lymphadenopathy. Femoral & Inguinal  Generalized Femoral & Inguinal Lymphatics: Left - Description - No Localized lymphadenopathy. Right - Description - No Localized lymphadenopathy.   Results Adin Hector MD; 03/06/2019 9:32 AM) Procedures  Name Value Date Hemorrhoids Procedure Anal exam: External Hemorrhoid Internal exam: Mass Other: Firm mass felt posterior midline around 8 cm from sphincters. Mildly sensitive but mobile. Involving about 20% of the circumference. Too high to be seen by anoscopy. ......  ...... Perianal skin clear. Small anterior midline tag. Normal sphincter tone. Tolerated digital and anoscopic exam. No fissure. No abscess. No fistula. No pilonidal disease. No condyloma. Grade 1 internal hemorrhoids without bleeding or prolapse. Sphincter tone normal and intact. Soft stool in rectal vault.  Performed: 03/06/2019 9:09 AM    Assessment & Plan Adin Hector MD; 03/06/2019 9:32 AM)  RECTAL ADENOCARCINOMA (C20) Impression: Pleasant healthy woman with mid rectal cancer noted due to positive Hemoccult on screening colonoscopy. Not particularly broad but firm. Most likely at least T2, possibly T3.  I think she would benefit from treatment for this. With the cancer diagnosis, this cannot be delayed.  MRI of pelvis for local staging. If T1 or  T2, proceed with surgery first. If T3 or thicker, neoadjuvant chemotherapy radiation therapy per standard protocol. Regardless, at some point she would benefit from low anterior resection. Would plan robotic minimally invasive approach. Anastomosis could be more distal than 5 cm, making the need for diverting loop ileostomy possible. Plan marking testing case. She is quite active without any major bowel or sphincter issues, so I don't think she needs permanent colostomy.  She is very anxious but motivated to get this dealt with. Her brother is getting a colonoscopy. Her son is over 56. I recommend that any first degree relative over 40 should consider colonoscopy, so therefore her son should proceed with colonoscopy as  well.  Current Plans You are being scheduled for surgery- Our schedulers will call you.  You should hear from our office's scheduling department within 5 working days about the location, date, and time of surgery. We try to make accommodations for patient's preferences in scheduling surgery, but sometimes the OR schedule or the surgeon's schedule prevents [place] from making those accommodations.  If you have not heard from our office [phone]) in 5 working days, call the office and ask for your surgeon's nurse.  If you have other questions about your diagnosis, plan, or surgery, call the office and ask for your surgeon's nurse.  Written instructions provided The anatomy & physiology of the digestive tract was discussed. The pathophysiology of the colon was discussed. Natural history risks without surgery was discussed. I feel the risks of no intervention will lead to serious problems that outweigh the operative risks; therefore, I recommended a partial colectomy to remove the pathology. Minimally invasive (Robotic/Laparoscopic) & open techniques were discussed.  Risks such as bleeding, infection, abscess, leak, reoperation, possible ostomy, hernia, heart attack, death, and  other risks were discussed. I noted a good likelihood this will help address the problem. Goals of post-operative recovery were discussed as well. Need for adequate nutrition, daily bowel regimen and healthy physical activity, to optimize recovery was noted as well. We will work to minimize complications. Educational materials were available as well. Questions were answered. The patient expresses understanding & wishes to proceed with surgery.  ANOSCOPY, DIAGNOSTIC (46600)  PREOP COLON - ENCOUNTER FOR PREOPERATIVE EXAMINATION FOR GENERAL SURGICAL PROCEDURE (Z01.818)  Current Plans Pt Education - [facility] Colon Bowel Prep 2018 ERAS/Miralax/Antibiotics Started Neomycin Sulfate 500 MG Oral Tablet, 2 (two) Tablet SEE NOTE, #6, 03/06/2019, No Refill. Local Order: TAKE TWO TABLETS AT 2 PM, 3 PM, AND 10 PM THE DAY PRIOR TO SURGERY Started Flagyl 500 MG Oral Tablet, 2 (two) Tablet SEE NOTE, #6, 03/06/2019, No Refill. Local Order: Take at 2pm, 3pm, and 10pm the day prior to your colon operation Pt Education - Pamphlet Given - Laparoscopic Colorectal Surgery: discussed with patient and provided information. Pt Education - [facility] Colorectal Cancer (AT): discussed with patient and provided information. Pt Education - [facility] Colectomy post-op instructions: discussed with patient and provided information.  Adin Hector, MD, FACS, MASCRS [facility]    [address], [place], [place] [address] [phone] Main / Paging [phone] Fax

## 2019-03-07 ENCOUNTER — Ambulatory Visit: Payer: Self-pay | Admitting: Surgery

## 2019-03-07 ENCOUNTER — Other Ambulatory Visit: Payer: Self-pay | Admitting: Surgery

## 2019-03-07 DIAGNOSIS — C20 Malignant neoplasm of rectum: Secondary | ICD-10-CM

## 2019-03-07 NOTE — Patient Instructions (Addendum)
Kathleen Flores  03/07/2019       Your procedure is scheduled on:  03-14-2019   Report to [facility] Main  Entrance, Report to admitting at  6:30 AM    Call this number if you have problems the morning of surgery [phone]    Remember: DRINK 2 PRE-SURGERY ENSURE [name] @  10:00 PM .   NO SOLID FOODS AFTER MIDNIGHT.  CLEAR LIQUID DIET FROM MIDNIGHT UNTIL 5:30 AM .  PLEASE FINISH  ONE ENDURE PRE-SURGERY DRINK  @ 5:30 AM (WHICH IS 3 HOURS PRIOR TO START OF YOUR PROCEDURE  START TIME).   NOTHING BY MOUTH AFTER 5:30 AM DAY OF SURGERY   INCLUDING WATER, CANDY, GUM, MINTS.     BRUSH YOUR TEETH MORNING OF SURGERY AND RINSE YOUR MOUTH OUT   Take these medicines the morning of surgery with A SIP OF WATER:   Amlodipine (Norvasc),  Levothyroxine (Synthroid)                              You may not have any metal on your body including hair pins and piercings              Do not wear jewelry, make-up, lotions, powders or perfumes, deodorant              Do not wear nail polish.  Do not shave  48 hours prior to surgery.            Do not bring valuables to the hospital. [place].  Contacts, dentures or bridgework may not be worn into surgery.  Leave suitcase in the car. After surgery it may be brought to your room.    _____________________________________________________________________     CLEAR LIQUID DIET   Foods Allowed                                                                     Foods Excluded  Coffee and tea, regular and decaf                             liquids that you cannot  Plain Jell-O in any flavor                                             see through such as: Fruit ices (not with fruit pulp)                                     milk, soups, orange juice  Iced Popsicles                                    All  solid food Carbonated beverages, regular and diet                                     Cranberry, grape and apple juices Sports drinks like Gatorade Lightly seasoned clear broth or consume(fat free) Sugar, honey syrup    _____________________________________________________________________             [facility] - Preparing for Surgery Before surgery, you can play an important role.  Because skin is not sterile, your skin needs to be as free of germs as possible.  You can reduce the number of germs on your skin by washing with CHG (chlorahexidine gluconate) soap before surgery.  CHG is an antiseptic cleaner which kills germs and bonds with the skin to continue killing germs even after washing. Please DO NOT use if you have an allergy to CHG or antibacterial soaps.  If your skin becomes reddened/irritated stop using the CHG and inform your nurse when you arrive at Short Stay. Do not shave (including legs and underarms) for at least 48 hours prior to the first CHG shower.  You may shave your face/neck. Please follow these instructions carefully:  1.  Shower with CHG Soap the night before surgery and the  morning of Surgery.  2.  If you choose to wash your hair, wash your hair first as usual with your  normal  shampoo.  3.  After you shampoo, rinse your hair and body thoroughly to remove the  shampoo.                            4.  Use CHG as you would any other liquid soap.  You can apply chg directly  to the skin and wash                       Gently with a scrungie or clean washcloth.  5.  Apply the CHG Soap to your body ONLY FROM THE NECK DOWN.   Do not use on face/ open                           Wound or open sores. Avoid contact with eyes, ears mouth and genitals (private parts).                       Wash face,  Genitals (private parts) with your normal soap.             6.  Wash thoroughly, paying special attention to the area where your surgery  will be performed.  7.  Thoroughly rinse your body with warm water from the neck down.  8.   DO NOT shower/wash with your normal soap after using and rinsing off  the CHG Soap.             9.  Pat yourself dry with a clean towel.            10.  Wear clean pajamas.            11.  Place clean sheets on your bed the night of your first shower and do not  sleep with pets. Day of Surgery : Do not apply any lotions/deodorants the morning of surgery.  Please wear clean clothes to the hospital/surgery center.  FAILURE TO  FOLLOW THESE INSTRUCTIONS MAY RESULT IN THE CANCELLATION OF YOUR SURGERY PATIENT SIGNATURE_________________________________  NURSE SIGNATURE__________________________________  ________________________________________________________________________   Kathleen Flores  An incentive spirometer is a tool that can help keep your lungs clear and active. This tool measures how well you are filling your lungs with each breath. Taking long deep breaths may help reverse or decrease the chance of developing breathing (pulmonary) problems (especially infection) following:  A long period of time when you are unable to move or be active. BEFORE THE PROCEDURE   If the spirometer includes an indicator to show your best effort, your nurse or respiratory therapist will set it to a desired goal.  If possible, sit up straight or lean slightly forward. Try not to slouch.  Hold the incentive spirometer in an upright position. INSTRUCTIONS FOR USE  1. Sit on the edge of your bed if possible, or sit up as far as you can in bed or on a chair. 2. Hold the incentive spirometer in an upright position. 3. Breathe out normally. 4. Place the mouthpiece in your mouth and seal your lips tightly around it. 5. Breathe in slowly and as deeply as possible, raising the piston or the ball toward the top of the column. 6. Hold your breath for 3-5 seconds or for as long as possible. Allow the piston or ball to fall to the bottom of the column. 7. Remove the mouthpiece from your mouth and breathe  out normally. 8. Rest for a few seconds and repeat Steps 1 through 7 at least 10 times every 1-2 hours when you are awake. Take your time and take a few normal breaths between deep breaths. 9. The spirometer may include an indicator to show your best effort. Use the indicator as a goal to work toward during each repetition. 10. After each set of 10 deep breaths, practice coughing to be sure your lungs are clear. If you have an incision (the cut made at the time of surgery), support your incision when coughing by placing a pillow or rolled up towels firmly against it. Once you are able to get out of bed, walk around indoors and cough well. You may stop using the incentive spirometer when instructed by your caregiver.  RISKS AND COMPLICATIONS  Take your time so you do not get dizzy or light-headed.  If you are in pain, you may need to take or ask for pain medication before doing incentive spirometry. It is harder to take a deep breath if you are having pain. AFTER USE  Rest and breathe slowly and easily.  It can be helpful to keep track of a log of your progress. Your caregiver can provide you with a simple table to help with this. If you are using the spirometer at home, follow these instructions: [place] IF:   You are having difficultly using the spirometer.  You have trouble using the spirometer as often as instructed.  Your pain medication is not giving enough relief while using the spirometer.  You develop fever of 100.5 F (38.1 C) or higher. SEEK IMMEDIATE MEDICAL CARE IF:   You cough up bloody sputum that had not been present before.  You develop fever of 102 F (38.9 C) or greater.  You develop worsening pain at or near the incision site. MAKE SURE YOU:   Understand these instructions.  Will watch your condition.  Will get help right away if you are not doing well or get worse. Document Released: 04/11/2007 Document Revised: 02/21/2012  Document Reviewed:  06/12/2007 [facility] Patient Information 2014 [employer], [place].   ________________________________________________________________________  WHAT IS A BLOOD TRANSFUSION? Blood Transfusion Information  A transfusion is the replacement of blood or some of its parts. Blood is made up of multiple cells which provide different functions.  Red blood cells carry oxygen and are used for blood loss replacement.  White blood cells fight against infection.  Platelets control bleeding.  Plasma helps clot blood.  Other blood products are available for specialized needs, such as hemophilia or other clotting disorders. BEFORE THE TRANSFUSION  Who gives blood for transfusions?   Healthy volunteers who are fully evaluated to make sure their blood is safe. This is blood bank blood. Transfusion therapy is the safest it has ever been in the practice of medicine. Before blood is taken from a donor, a complete history is taken to make sure that person has no history of diseases nor engages in risky social behavior (examples are intravenous drug use or sexual activity with multiple partners). The donor's travel history is screened to minimize risk of transmitting infections, such as malaria. The donated blood is tested for signs of infectious diseases, such as HIV and hepatitis. The blood is then tested to be sure it is compatible with you in order to minimize the chance of a transfusion reaction. If you or a relative donates blood, this is often done in anticipation of surgery and is not appropriate for emergency situations. It takes many days to process the donated blood. RISKS AND COMPLICATIONS Although transfusion therapy is very safe and saves many lives, the main dangers of transfusion include:   Getting an infectious disease.  Developing a transfusion reaction. This is an allergic reaction to something in the blood you were given. Every precaution is taken to prevent this. The decision to have a blood  transfusion has been considered carefully by your caregiver before blood is given. Blood is not given unless the benefits outweigh the risks. AFTER THE TRANSFUSION  Right after receiving a blood transfusion, you will usually feel much better and more energetic. This is especially true if your red blood cells have gotten low (anemic). The transfusion raises the level of the red blood cells which carry oxygen, and this usually causes an energy increase.  The nurse administering the transfusion will monitor you carefully for complications. HOME CARE INSTRUCTIONS  No special instructions are needed after a transfusion. You may find your energy is better. Speak with your caregiver about any limitations on activity for underlying diseases you may have. SEEK MEDICAL CARE IF:   Your condition is not improving after your transfusion.  You develop redness or irritation at the intravenous (IV) site. SEEK IMMEDIATE MEDICAL CARE IF:  Any of the following symptoms occur over the next 12 hours:  Shaking chills.  You have a temperature by mouth above 102 F (38.9 C), not controlled by medicine.  Chest, back, or muscle pain.  People around you feel you are not acting correctly or are confused.  Shortness of breath or difficulty breathing.  Dizziness and fainting.  You get a rash or develop hives.  You have a decrease in urine output.  Your urine turns a dark color or changes to pink, red, or brown. Any of the following symptoms occur over the next 10 days:  You have a temperature by mouth above 102 F (38.9 C), not controlled by medicine.  Shortness of breath.  Weakness after normal activity.  The white part of the eye turns yellow (  jaundice).  You have a decrease in the amount of urine or are urinating less often.  Your urine turns a dark color or changes to pink, red, or brown. Document Released: 11/26/2000 Document Revised: 02/21/2012 Document Reviewed: 07/15/2008 [facility] Patient  Information 2014 [place].

## 2019-03-07 NOTE — Progress Notes (Addendum)
-  Called and spoke w/ pt via phone for covid 19 screening prior to PAT appointment tomorrow.  Pt denies, fever, runny nose, cough, sob, sore throat difficulty breathing and has not travelled out of the county or state.

## 2019-03-08 ENCOUNTER — Encounter (HOSPITAL_COMMUNITY): Payer: Self-pay

## 2019-03-08 ENCOUNTER — Other Ambulatory Visit: Payer: Self-pay

## 2019-03-08 ENCOUNTER — Ambulatory Visit
Admission: RE | Admit: 2019-03-08 | Discharge: 2019-03-08 | Disposition: A | Discharge status: Home / Self Care | Payer: PPO | Source: Ambulatory Visit | Attending: Surgery | Admitting: Surgery

## 2019-03-08 ENCOUNTER — Encounter (HOSPITAL_COMMUNITY)
Admission: RE | Admit: 2019-03-08 | Discharge: 2019-03-08 | Disposition: A | Discharge status: Home / Self Care | Payer: PPO | Source: Ambulatory Visit | Attending: Surgery | Admitting: Surgery

## 2019-03-08 DIAGNOSIS — C20 Malignant neoplasm of rectum: Secondary | ICD-10-CM

## 2019-03-08 DIAGNOSIS — Z01818 Encounter for other preprocedural examination: Secondary | ICD-10-CM | POA: Insufficient documentation

## 2019-03-08 DIAGNOSIS — I10 Essential (primary) hypertension: Secondary | ICD-10-CM | POA: Insufficient documentation

## 2019-03-08 DIAGNOSIS — R000 Tachycardia, unspecified: Secondary | ICD-10-CM | POA: Diagnosis not present

## 2019-03-08 DIAGNOSIS — C210 Malignant neoplasm of anus, unspecified: Secondary | ICD-10-CM | POA: Diagnosis not present

## 2019-03-08 LAB — BASIC METABOLIC PANEL
Anion gap: 11 (ref 5–15)
BUN: 11 mg/dL (ref 8–23)
CO2: 26 mmol/L (ref 22–32)
Calcium: 9.5 mg/dL (ref 8.9–10.3)
Chloride: 99 mmol/L (ref 98–111)
Creatinine, Ser: 0.54 mg/dL (ref 0.44–1.00)
GFR calc Af Amer: >60 mL/min (ref >60)
GFR calc non Af Amer: >60 mL/min (ref >60)
GLUCOSE: 111 mg/dL — AB (ref 70–99)
Potassium: 3.9 mmol/L (ref 3.5–5.1)
Sodium: 136 mmol/L (ref 135–145)

## 2019-03-08 LAB — CBC
HEMATOCRIT: 45.1 % (ref 36.0–46.0)
HEMOGLOBIN: 14.8 g/dL (ref 12.0–15.0)
MCH: 31.7 pg (ref 26.0–34.0)
MCHC: 32.8 g/dL (ref 30.0–36.0)
MCV: 96.6 fL (ref 80.0–100.0)
Platelets: 234 10*3/uL (ref 150–400)
RBC: 4.67 MIL/uL (ref 3.87–5.11)
RDW: 11.9 % (ref 11.5–15.5)
WBC: 6.9 10*3/uL (ref 4.0–10.5)
nRBC: 0 % (ref 0.0–0.2)

## 2019-03-08 LAB — HEMOGLOBIN A1C
Hgb A1c MFr Bld: 5.7 % — ABNORMAL HIGH (ref 4.8–5.6)
Mean Plasma Glucose: 116.89 mg/dL

## 2019-03-08 LAB — ABO/RH: ABO/RH(D): O POS

## 2019-03-08 MED ORDER — GADOBENATE DIMEGLUMINE 529 MG/ML IV SOLN
13.0000 mL | Freq: Once | INTRAVENOUS | Status: AC | PRN
  Administered 2019-03-08: 13 mL via INTRAVENOUS

## 2019-03-08 NOTE — Consult Note (Signed)
[place] Nurse requested for preoperative stoma site marking  Discussed surgical procedure and stoma creation with patient. Explained role of the [place] nurse team.  Provided the patient with educational booklet and provided samples of pouching options.  Answered patient and family questions.   Examined patient sitting, and standing in order to place the marking in the patient's visual field, away from any creases or abdominal contour issues and within the rectus muscle.   Marked for colostomy in the LLQ  4 cm to the left of the umbilicus and 4 cm below the umbilicus.  Marked for ileostomy in the RLQ  4 cm to the right of the umbilicus and  4 cm below the umbilicus.   Patient's abdomen cleansed with CHG wipes at site markings, allowed to air dry prior to marking.Covered mark with thin film transparent dressing to preserve mark until date of surgery.   [place] Nurse team will follow up with patient after surgery for continue ostomy care and teaching.  [place] team will follow as needed after surgery.  Domenic Moras MSN, RN, FNP-BC CWON Wound, Ostomy, Continence Nurse Pager [phone]

## 2019-03-09 ENCOUNTER — Encounter (HOSPITAL_COMMUNITY): Payer: Self-pay | Admitting: Physician Assistant

## 2019-03-09 NOTE — Progress Notes (Signed)
Anesthesia Chart Review   Case:  [id number] Date/Time:  03/14/19 0800   Procedure:  ROBOTIC LOW ANTERIOR RECTOSIGMOID RESECTION POSSIBLE OSTOMY, RIGID PROCTOSCOPY (N/A )   Anesthesia type:  General   Pre-op diagnosis:  Mid Rectal Cancer   Location:  [facility] ROOM 02 / [facility]   Surgeon:  Michael Boston, MD      DISCUSSION: 73 yo current every day smoker (4 pack years) with h/o anxiety, HTN, hypothyroidism, hyperlipemia, mid rectal cancer scheduled for above procedure 03/14/19 with Dr. Michael Boston.   Pt can proceed with planned procedure barring acute status change.   VS: BP (!) 188/74   Pulse (!) 116   Temp 36.7 C (Oral)   Resp 18   Ht 5\' 7"  (1.702 m)   Wt 64.9 kg   SpO2 100%   BMI 22.42 kg/m   PROVIDERS: [name], FNP is PCP last seen    LABS: Labs reviewed: Acceptable for surgery. (all labs ordered are listed, but only abnormal results are displayed)  Labs Reviewed  HEMOGLOBIN A1C - Abnormal; Notable for the following components:      Result Value   Hgb A1c MFr Bld 5.7 (*)    All other components within normal limits  BASIC METABOLIC PANEL - Abnormal; Notable for the following components:   Glucose, Bld 111 (*)    All other components within normal limits  CBC  TYPE AND SCREEN  ABO/RH     IMAGES: CT abdomen Pelvis 03/05/19 IMPRESSION: 1. There is some eccentric soft tissue thickening about the low rectum (e.g. Series 2, image 108) without obvious mass or lymphadenopathy.  2. No definite evidence of metastatic disease in the chest, abdomen, or pelvis.  3. There are multiple tiny nodules in the bilateral lung apices, for example a 3 mm nodule of the right upper lobe (series 3, image 26). Although nonspecific, these are unlikely manifestation of metastatic disease. Attention on follow-up.  4. There is a 1.0 cm nodule of the left adrenal gland, low-attenuation although not clearly benign and fat containing. Attention on follow-up.  EKG: 03/08/2019 Rate  106 bpm Sinus tachycardia Otherwise normal ECG   CV:  Past Medical History:  Diagnosis Date  . Anxiety    situational anxiety  . Benign essential HTN   . Hyperglycemia   . Hyperlipemia   . Hypothyroidism   . Vitamin D deficiency   . White coat syndrome with diagnosis of hypertension     Past Surgical History:  Procedure Laterality Date  . ABDOMINAL HYSTERECTOMY     age 17  . APPENDECTOMY    . BREAST EXCISIONAL BIOPSY Bilateral    benign  . ECTOPIC PREGNANCY SURGERY    . TUBAL LIGATION      MEDICATIONS: . amLODipine (NORVASC) 5 MG tablet  . Ascorbic Acid (VITAMIN C) 1000 MG tablet  . BIOTIN PO  . Calcium Carbonate-Vitamin D 600-400 MG-UNIT tablet  . cholecalciferol (VITAMIN D3) 25 MCG (1000 UT) tablet  . Cyanocobalamin (VITAMIN B-12 PO)  . folic acid (FOLVITE) 751 MCG tablet  . levothyroxine (SYNTHROID, LEVOTHROID) 125 MCG tablet  . losartan (COZAAR) 100 MG tablet  . Zinc Acetate, Oral, (ZINC ACETATE PO)   No current facility-administered medications for this encounter.    Maia Plan [facility] Pre-Surgical Testing [phone] 03/09/19 10:49 AM

## 2019-03-09 NOTE — Anesthesia Preprocedure Evaluation (Deleted)
Anesthesia Evaluation    Airway        Dental   Pulmonary Current Smoker,           Cardiovascular hypertension,      Neuro/Psych    GI/Hepatic   Endo/Other    Renal/GU      Musculoskeletal   Abdominal   Peds  Hematology   Anesthesia Other Findings   Reproductive/Obstetrics                             Anesthesia Physical Anesthesia Plan  ASA:   Anesthesia Plan:    Post-op Pain Management:    Induction:   PONV Risk Score and Plan:   Airway Management Planned:   Additional Equipment:   Intra-op Plan:   Post-operative Plan:   Informed Consent:   Plan Discussed with:   Anesthesia Plan Comments: (See PAT note 03/08/19, Konrad Felix, PA-C)        Anesthesia Quick Evaluation

## 2019-03-12 ENCOUNTER — Telehealth: Payer: Self-pay

## 2019-03-12 ENCOUNTER — Inpatient Hospital Stay: Payer: PPO | Attending: Oncology | Admitting: Oncology

## 2019-03-12 ENCOUNTER — Other Ambulatory Visit: Payer: Self-pay

## 2019-03-12 ENCOUNTER — Telehealth: Payer: Self-pay | Admitting: Pharmacist

## 2019-03-12 VITALS — BP 189/74 | HR 95 | Temp 99.5°F | Resp 18 | Ht 67.0 in | Wt 146.1 lb

## 2019-03-12 DIAGNOSIS — F419 Anxiety disorder, unspecified: Secondary | ICD-10-CM

## 2019-03-12 DIAGNOSIS — Z801 Family history of malignant neoplasm of trachea, bronchus and lung: Secondary | ICD-10-CM | POA: Diagnosis not present

## 2019-03-12 DIAGNOSIS — Z7289 Other problems related to lifestyle: Secondary | ICD-10-CM

## 2019-03-12 DIAGNOSIS — I10 Essential (primary) hypertension: Secondary | ICD-10-CM

## 2019-03-12 DIAGNOSIS — C20 Malignant neoplasm of rectum: Secondary | ICD-10-CM

## 2019-03-12 DIAGNOSIS — F1390 Sedative, hypnotic, or anxiolytic use, unspecified, uncomplicated: Secondary | ICD-10-CM | POA: Diagnosis not present

## 2019-03-12 DIAGNOSIS — Z800 Family history of malignant neoplasm of digestive organs: Secondary | ICD-10-CM | POA: Diagnosis not present

## 2019-03-12 MED ORDER — CAPECITABINE 500 MG PO TABS: ORAL_TABLET | ORAL | 0 refills | Status: DC

## 2019-03-12 MED ORDER — ALPRAZOLAM 0.25 MG PO TABS: 0.2500 mg | ORAL_TABLET | Freq: Two times a day (BID) | ORAL | 0 refills | Status: DC | PRN

## 2019-03-12 NOTE — Telephone Encounter (Signed)
Oral Chemotherapy Pharmacist Encounter   I spoke with patient in exam room for overview of: Xeloda (capecitabine) for the neoadjuvant treatment of rectal cancer in conjunction with radiation, planned duration 28 treatment days over 38 calendar days.  Counseled patient on administration, dosing, side effects, monitoring, drug-food interactions, safe handling, storage, and disposal.  Patient will take Xeloda 500mg  tablets, 3 tablets (1500mg ) by mouth in AM and 3 tabs (1500mg ) by mouth in PM, within 30 minutes of finishing meals, on days of radiation only.  Xeloda and radiation start date: planned for 03/19/2019  Adverse effects of Xeloda include but are not limited to: fatigue, decreased blood counts, GI upset, diarrhea, mouth sores, and hand-foot syndrome.  Patient will obtain anti diarrheal and alert the office of 4 or more loose stools above baseline.   Reviewed with patient importance of keeping a medication schedule and plan for any missed doses.  Kathleen Flores voiced understanding and appreciation.   All questions answered.  Medication reconciliation performed and medication/allergy list updated. Patient will discontinue folic acid during concurrent chemoradiation.  Will follow up with patient regarding insurance and pharmacy once insurance authorization is obtained and copayment is known.   Patient knows to call the office with questions or concerns. Oral Oncology Clinic will continue to follow.  Kathleen Flores, PharmD, BCPS, BCOP  03/12/2019  4:22 PM Oral Oncology Clinic [phone]

## 2019-03-12 NOTE — Progress Notes (Signed)
GI Location of Tumor / Histology: Mid rectal cancer  Kathleen Flores was found to be hemoccult positive at her annual visit.  Staging: 3.4 cm, T3b N1, 6.5 cm from internal anal sphincter.  Tumor extended through the muscularis propria (T3b), and a single 7 mm right perirectal node (N1).  CT CAP 03/05/2019: There is some eccentric soft tissue thickening about the low rectum or lyphadenopathy.  No definite evidence of metastatic disease in the chest, abdomen, or pelvis.  Multiple tiny nodules in the bilateral lung apices.  There is a 1.0 cm nodule of the left adrenal gland.  Colonoscopy: mass in mid rectum.  Biopsies of the rectum 02/28/2019   Past/Anticipated interventions by surgeon, if any: Dr. Johney [place] 03/06/2019 -MRI for local staging- if T1 or T2 proceed with surgery first. -If T3 or thicker, neoadjuvant chemotherapy radiation therapy per standard protocol. -Regardless, at some point she would benefit from low anterior resection. -Would plan robotic minimally invasive approach. - Surgery date is not set at this time. Plan 03/08/2019 -Per standard care, I think the patient would benefit from neoadjuvant chemoradiation, 10 week recovery, then robotic low anterior resection with polyp and possible diverting loop ileostomy.  Past/Anticipated interventions by medical oncology, if any:  Dr. Benay Spice 03/12/2019  -I recommend neoadjuvant concurrent capecitabine and radiation. - We discussed the probable need for a diverting ileostomy. -I will present her case at the GI tumor conference. -We will test for MSI and mismatch repair protein expression on the resected tumor. -She will return for an office and lab visit during the second week of capecitabine/radiation.   Weight changes, if any:   Bowel/Bladder complaints, if any:   Nausea / Vomiting, if any: No  Pain issues, if any:    Any blood per rectum:     SAFETY ISSUES:  Prior radiation? No  Pacemaker/ICD? No  Possible current  pregnancy? Tubal ligation, abdominal hysterectomy  Is the patient on methotrexate? No  Current Complaints/Details:

## 2019-03-12 NOTE — Progress Notes (Signed)
[facility] Patient Consult   Requesting MD: Elesa Garman 73 y.o.  1946/05/26    Reason for Consult: Rectal cancer   HPI: Ms. Hobson had a Hemoccult positive stool on rectal exam at her yearly physical.  She was referred to Dr. Ardis Hughs and underwent a colonoscopy on 02/28/2019.  An ulcerated mass was found in the mid rectum.  The mass was located at 7 cm from the anal verge.  Biopsies were obtained.  The area was tattooed.  The exam was otherwise normal. The pathology revealed adenocarcinoma.  The depth of invasion could not be judged on a superficial biopsy. CTs of the chest, abdomen, and pelvis on 03/05/2019 feel multiple tiny bilateral lung nodules, a 1 cm left adrenal nodule, and eccentric soft tissue thickening at the low rectum.  No enlarged abdominal pelvic lymph nodes.  An MRI pelvis on 03/08/2019 revealed a mass beginning at 6.5 cm from the internal anal sphincter.  Tumor extended through the muscularis propria, T3b, and a single 7 mm right perirectal node, N1.  Ms. Brogan reports feeling well.  She saw Dr. Johney Maine.  A firm mass was noted at 8 cm from the sphincters.  Surgery was discussed.  She is now referred to consider neoadjuvant therapy after the staging MRI confirmed clinical stage III disease.  Past Medical History:  Diagnosis Date  . Anxiety    situational anxiety  . Benign essential HTN   . Hyperglycemia   . Hyperlipemia   . Hypothyroidism   . Vitamin D deficiency   . White coat syndrome with diagnosis of hypertension     .  G2, P1, 1 ectopic pregnancy  Past Surgical History:  Procedure Laterality Date  . ABDOMINAL HYSTERECTOMY     age 73  . APPENDECTOMY    . BREAST EXCISIONAL BIOPSY Bilateral    benign  . ECTOPIC PREGNANCY SURGERY    . TUBAL LIGATION      Medications: Reviewed  Allergies:  Allergies  Allergen Reactions  . Metoprolol Swelling    Swelling of the lips (Toprol XL)  . Statins     Myalgia  . Demerol  [Meperidine Hcl] Nausea And Vomiting    Family history: Her mother had lung cancer and was a smoker.  Her maternal grandfather had esophagus cancer and was a heavy drinker.  No other family history of cancer  Social History:   She lives with her husband in [place].  She is a retired Therapist, sports.  She does not use cigarettes.  She has 1 glass of wine per day.  No risk factor for HIV or hepatitis.  No transfusion history.  ROS:   Positives include: 10 pound weight loss over the past year after discontinuing bread, rectal bleeding following the digital exam via her primary provider, upper respiratory infection Christmas 2019  A complete ROS was otherwise negative.  Physical Exam:  Blood pressure (!) 189/74, pulse 95, temperature 99.5 F (37.5 C), temperature source Oral, resp. rate 18, height 5' 7"  (1.702 m), weight 146 lb 1.6 oz (66.3 kg), SpO2 98 %.  HEENT: Neck without mass Lungs: Clear bilaterally Cardiac: Tachycardia, 2/6 systolic murmur, regular rhythm Abdomen: No hepatosplenomegaly, no mass, nontender  Vascular: No leg edema Lymph nodes: Cervical, supraclavicular, axillary, or inguinal nodes Neurologic: Alert and oriented, the motor exam appears intact in the upper and lower extremities bilaterally Skin: No rash Musculoskeletal: No spine tenderness   LAB:  CBC  Lab Results  Component Value Date  WBC 6.9 03/08/2019   HGB 14.8 03/08/2019   HCT 45.1 03/08/2019   MCV 96.6 03/08/2019   PLT 234 03/08/2019        CMP  Lab Results  Component Value Date   NA 136 03/08/2019   K 3.9 03/08/2019   CL 99 03/08/2019   CO2 26 03/08/2019   GLUCOSE 111 (H) 03/08/2019   BUN 11 03/08/2019   CREATININE 0.54 03/08/2019   CALCIUM 9.5 03/08/2019   GFRNONAA >60 03/08/2019   GFRAA >60 03/08/2019    CEA on 03/02/2019: 0.8  Imaging: CTs 03/05/2019 and MRI 03/08/2019-reviewed   Assessment/Plan:   1. Rectal cancer  Nonobstructing mass in the mid rectum on colonoscopy 02/28/2019, 7  cm from the anal verge, biopsy confirmed adenocarcinoma-at least intramucosal  CTs 03/05/2019- eccentric soft tissue thickening at the low rectum, multiple tiny lung nodules-nonspecific, 1 cm left adrenal nodule, no definite evidence of metastatic disease  MR pelvis 03/08/2019- tumor at 6.5 cm from the internal anal sphincter, T3b,N1 (single 7 mm right perirectal lymph node) 2. Hypertension 3. Anxiety   Disposition:   Ms. Tison has been diagnosed with rectal cancer.  She has clinical stage III disease based on the staging evaluation to date.  I discussed treatment options with Ms. Bowermaster and her husband.  I discussed the rationale for neoadjuvant therapy.  She is scheduled to see Dr. Lisbeth Renshaw tomorrow.  I recommend neoadjuvant concurrent capecitabine and radiation.  We reviewed potential toxicities associated with capecitabine including the chance for nausea, mucositis, diarrhea, alopecia, and hematologic toxicity.  We discussed the rash, sun sensitivity, hyperpigmentation, and hand/foot syndrome associated with capecitabine.  She met with the Cancer center pharmacist today.  She agrees to proceed.  We discussed the probable need for a diverting ileostomy.  I will present her case at the GI tumor conference.  She does not appear to have hereditary non-polyposis colon cancer syndrome, but her family members are at increased risk of developing colorectal cancer.  She will be sure they have received appropriate screening.  We will test for MSI and mismatch repair protein expression on the resected tumor.  She will return for an office and lab visit during the second week of capecitabine/radiation.  Ms. Hinckley requested a refill on Xanax.  Betsy Coder, MD  03/12/2019, 2:20 PM

## 2019-03-12 NOTE — Telephone Encounter (Signed)
Called patient to introduce myself and the role of GI navigator. Patient advised of appointment with Dr. Benay Spice at 2 PM today. Patient understands to arrive 20 minutes early to register and that she will not be allowed to bring husband but he can be on speaker phone during appointment. Patient reports being a  "very anxious" person and is having a hard time with her diagnosis. Informed patient of LCSW's on her treatment team that are a resource for processing feelings and emotions. Social work referral placed. I plan to check in with patient after her initial consults with Dr. Benay Spice and Dr. Lisbeth Renshaw.

## 2019-03-12 NOTE — Telephone Encounter (Signed)
Oral Oncology Pharmacist Encounter  Received new prescription for Xeloda (capecitabine) for the neoadjuvant treatment of rectal cancer in conjunction with radiation, planned duration 28 treatment days over 38 calendar days.  Xeloda will be dosed at ~825 mg/m2 by mouth 2 times daily on days of radiation only  Labs from Epic assessed, OK for treatment.  Current medication list in Epic reviewed, DDI with Xeloda and folic identified:  Folic Acid may enhance the adverse/toxic effect of fluorouracil products. The mechanism of this interaction involves stabilization of the binding of FdUMP to its target, thymidylate synthase. FdUMP is more cytotoxic in the presence of folic acid. Patient will be counseled to discontinue folic acid during concurrent chemoradiation.  Prescription has been e-scribed to the [facility] for benefits analysis and approval.  Oral Oncology Clinic will continue to follow for insurance authorization, copayment issues, initial counseling and start date.  Johny Drilling, PharmD, BCPS, BCOP  03/12/2019 4:16 PM Oral Oncology Clinic [phone]

## 2019-03-12 NOTE — Addendum Note (Signed)
Addended by: Tania Ade on: 03/12/2019 04:04 PM   Modules accepted: Orders

## 2019-03-13 ENCOUNTER — Telehealth: Payer: Self-pay | Admitting: Radiation Oncology

## 2019-03-13 ENCOUNTER — Ambulatory Visit
Admission: RE | Admit: 2019-03-13 | Discharge: 2019-03-13 | Disposition: A | Discharge status: Home / Self Care | Payer: PPO | Source: Ambulatory Visit | Attending: Radiation Oncology | Admitting: Radiation Oncology

## 2019-03-13 ENCOUNTER — Encounter: Payer: Self-pay | Admitting: Radiation Oncology

## 2019-03-13 ENCOUNTER — Telehealth: Payer: Self-pay

## 2019-03-13 ENCOUNTER — Telehealth: Payer: Self-pay | Admitting: Oncology

## 2019-03-13 DIAGNOSIS — C20 Malignant neoplasm of rectum: Secondary | ICD-10-CM

## 2019-03-13 MED ORDER — CAPECITABINE 500 MG PO TABS: ORAL_TABLET | ORAL | 0 refills | Status: DC

## 2019-03-13 NOTE — Telephone Encounter (Signed)
Called and spoke with Mr. Ator. He voiced appreciation to the [facility] team for the smooth transition from diagnosis to treatment planning. Husband verbalized plan of care. No navigation needs verbalized and no barriers to treatment identified. Will follow as needed.

## 2019-03-13 NOTE — Telephone Encounter (Signed)
Called and scheduled appt per 3/30 los. ° °Patient aware of appt date and time. °

## 2019-03-13 NOTE — Telephone Encounter (Signed)
I spoke with the patient by phone and she is willing to try webex. We will try to troubleshoot this set up for her.

## 2019-03-13 NOTE — Progress Notes (Signed)
Radiation Oncology         [phone] ________________________________  Name: Kathleen Flores        MRN: [id number]  Date of Service: 03/13/2019 DOB: [birth year]  CC:[name], FNP  Michael Boston, MD     REFERRING PHYSICIANMichael Boston, MD   DIAGNOSIS: The encounter diagnosis was Malignant neoplasm of rectum [facility]).   HISTORY OF PRESENT ILLNESS: Kathleen Flores is a 73 y.o. female seen at the request of Dr. Benay Spice for a newly diagnosed adenocarcinoma of the rectum. She was seen for routine evaluation by her PCP and had a positive hemocult. She proceeded with colonoscopy with Dr. Ardis Hughs on 02/28/2019 that revealed a small tumor in the mid rectum. A biopsy of this revealed adenocarcinoma. She underwent a CT C/A/P on 03/05/2019 that was negative for metastatic disease. An MRI of the pelvis revealed a Stage IIIB, cT3bN1 tumor. She had a CEA as well that was 0.8. She comes today via Webex for consultation.    PREVIOUS RADIATION THERAPY: No   PAST MEDICAL HISTORY:  Past Medical History:  Diagnosis Date   Anxiety    situational anxiety   Benign essential HTN    Hyperglycemia    Hyperlipemia    Hypothyroidism    Vitamin D deficiency    White coat syndrome with diagnosis of hypertension        PAST SURGICAL HISTORY: Past Surgical History:  Procedure Laterality Date   ABDOMINAL HYSTERECTOMY     age 73   APPENDECTOMY     BREAST EXCISIONAL BIOPSY Bilateral    benign   ECTOPIC PREGNANCY SURGERY     TUBAL LIGATION       FAMILY HISTORY:  Family History  Problem Relation Age of Onset   Lung cancer Mother    Diabetes Father    Heart disease Father    Colon cancer Neg Hx    Esophageal cancer Neg Hx    Stomach cancer Neg Hx    Pancreatic cancer Neg Hx      SOCIAL HISTORY:  reports that she has quit smoking. She has a 4.00 pack-year smoking history. She has never used smokeless tobacco. She reports current alcohol use of about 7.0 standard  drinks of alcohol per week. She reports that she does not use drugs.   ALLERGIES: Metoprolol; Statins; and Demerol [meperidine hcl]   MEDICATIONS:  Current Outpatient Medications  Medication Sig Dispense Refill   ALPRAZolam (XANAX) 0.25 MG tablet Take 1 tablet (0.25 mg total) by mouth 2 (two) times daily as needed for anxiety. 60 tablet 0   amLODipine (NORVASC) 5 MG tablet Take 5 mg by mouth daily.     Ascorbic Acid (VITAMIN C) 1000 MG tablet Take 1,000 mg by mouth daily.      BIOTIN PO Take 12,000 mcg by mouth daily.      Calcium Carbonate-Vitamin D 600-400 MG-UNIT tablet Take 1 tablet by mouth daily with breakfast.      capecitabine (XELODA) 500 MG tablet Take 3 tablets (1500mg ) by mouth 2 times daily, immediately after food on days of radiation only, M-F 168 tablet 0   cholecalciferol (VITAMIN D3) 25 MCG (1000 UT) tablet Take 1,000 Units by mouth daily.     Cyanocobalamin (VITAMIN B-12 PO) Take 5,000 mcg by mouth daily.     folic acid (FOLVITE) 409 MCG tablet Take 800 mcg by mouth daily.      levothyroxine (SYNTHROID, LEVOTHROID) 125 MCG tablet Take 125 mcg by mouth daily before breakfast.  losartan (COZAAR) 100 MG tablet Take 100 mg by mouth daily.     Zinc Acetate, Oral, (ZINC ACETATE PO) Take 30 mg by mouth daily.      No current facility-administered medications for this encounter.      REVIEW OF SYSTEMS: On review of systems, the patient reports that she is doing well overall. She does report that she is quite anxious and is going to start taking Xanax daily. She denies any chest pain, shortness of breath, cough, fevers, chills, night sweats, unintended weight changes. She denies any bowel or bladder disturbances, and denies abdominal pain, nausea or vomiting. She denies any new musculoskeletal or joint aches or pains. A complete review of systems is obtained and is otherwise negative.     PHYSICAL EXAM:  Wt Readings from Last 3 Encounters:  03/12/19 146 lb 1.6  oz (66.3 kg)  03/08/19 143 lb 2 oz (64.9 kg)  02/28/19 147 lb (66.7 kg)   Temp Readings from Last 3 Encounters:  03/12/19 99.5 F (37.5 C) (Oral)  03/08/19 98.1 F (36.7 C) (Oral)  02/28/19 97.8 F (36.6 C)   BP Readings from Last 3 Encounters:  03/12/19 (!) 189/74  03/08/19 (!) 188/74  02/28/19 (!) 199/94   Pulse Readings from Last 3 Encounters:  03/12/19 95  03/08/19 (!) 116  02/28/19 81   In general this is a well appearing caucasian female in no acute distress. She's alert and oriented x4 and appropriate throughout the examination. Cardiopulmonary assessment is negative for acute distress and she exhibits normal effort.   ECOG = 0  0 - Asymptomatic (Fully active, able to carry on all predisease activities without restriction)  1 - Symptomatic but completely ambulatory (Restricted in physically strenuous activity but ambulatory and able to carry out work of a light or sedentary nature. For example, light housework, office work)  2 - Symptomatic, <50% in bed during the day (Ambulatory and capable of all self care but unable to carry out any work activities. Up and about more than 50% of waking hours)  3 - Symptomatic, >50% in bed, but not bedbound (Capable of only limited self-care, confined to bed or chair 50% or more of waking hours)  4 - Bedbound (Completely disabled. Cannot carry on any self-care. Totally confined to bed or chair)  5 - Death   Eustace Pen MM, Creech RH, Tormey DC, et al. [id number]). "Toxicity and response criteria of the [facility] Group". [place] Oncol. 5 (6): 649-55    LABORATORY DATA:  Lab Results  Component Value Date   WBC 6.9 03/08/2019   HGB 14.8 03/08/2019   HCT 45.1 03/08/2019   MCV 96.6 03/08/2019   PLT 234 03/08/2019   Lab Results  Component Value Date   NA 136 03/08/2019   K 3.9 03/08/2019   CL 99 03/08/2019   CO2 26 03/08/2019   No results found for: ALT, AST, GGT, ALKPHOS, BILITOT    RADIOGRAPHY: Ct Chest W  Contrast  Result Date: 03/05/2019 CLINICAL DATA:  Newly diagnosed rectal adenocarcinoma EXAM: CT CHEST, ABDOMEN, AND PELVIS WITH CONTRAST TECHNIQUE: Multidetector CT imaging of the chest, abdomen and pelvis was performed following the standard protocol during bolus administration of intravenous contrast. CONTRAST:  143mL OMNIPAQUE IOHEXOL 300 MG/ML SOLN, additional oral enteric contrast COMPARISON:  None. FINDINGS: CT CHEST FINDINGS Cardiovascular: Scattered atherosclerosis. Normal heart size. No pericardial effusion. Mediastinum/Nodes: No enlarged mediastinal, hilar, or axillary lymph nodes. Thyroid gland, trachea, and esophagus demonstrate no significant findings. Lungs/Pleura:  There are multiple tiny nodules in the bilateral lung apices, for example a 3 mm nodule of the right upper lobe (series 3, image 26). No pleural effusion or pneumothorax. Musculoskeletal: No chest wall mass or suspicious bone lesions identified. Nonacute fractures of the left ribs. CT ABDOMEN PELVIS FINDINGS Hepatobiliary: No focal liver abnormality is seen. No gallstones, gallbladder wall thickening, or biliary dilatation. Pancreas: Unremarkable. No pancreatic ductal dilatation or surrounding inflammatory changes. Spleen: Normal in size without focal abnormality. Adrenals/Urinary Tract: There is a 1.0 cm nodule of the left adrenal gland, low-attenuation although not clearly benign and fat containing. Kidneys are normal, without renal calculi, focal lesion, or hydronephrosis. Bladder is unremarkable. Stomach/Bowel: Stomach is within normal limits. Appendix is present and normal appearing in the right lower quadrant despite stated history of appendectomy (series 2, image 99). There is some eccentric soft tissue thickening about the low rectum (e.g. Series 2, image 108) without obvious mass. Occasional colonic diverticula. Vascular/Lymphatic: Mixed calcific atherosclerosis. No enlarged abdominal or pelvic lymph nodes. Reproductive: No mass  or other abnormality. Status post hysterectomy. Other: No abdominal wall hernia or abnormality. No abdominopelvic ascites. Musculoskeletal: No acute or significant osseous findings. IMPRESSION: 1. There is some eccentric soft tissue thickening about the low rectum (e.g. Series 2, image 108) without obvious mass or lymphadenopathy. 2. No definite evidence of metastatic disease in the chest, abdomen, or pelvis. 3. There are multiple tiny nodules in the bilateral lung apices, for example a 3 mm nodule of the right upper lobe (series 3, image 26). Although nonspecific, these are unlikely manifestation of metastatic disease. Attention on follow-up. 4. There is a 1.0 cm nodule of the left adrenal gland, low-attenuation although not clearly benign and fat containing. Attention on follow-up. Electronically Signed   By: Eddie Candle M.D.   On: 03/05/2019 10:08   Mr Pelvis W Wo Contrast  Result Date: 03/08/2019 CLINICAL DATA:  Newly diagnosed rectal adenocarcinoma. Local staging. EXAM: MRI PELVIS WITHOUT AND WITH CONTRAST TECHNIQUE: Multiplanar multisequence MR imaging of the pelvis was performed both before and after administration of intravenous contrast. Small amount of [place] gel was administered per rectum to optimize tumor evaluation. CONTRAST:  52mL MULTIHANCE GADOBENATE DIMEGLUMINE 529 MG/ML IV SOLN COMPARISON:  None. FINDINGS: TUMOR LOCATION Tumor distance from Anal Verge/Skin Surface:  9.0 cm Tumor distance from Internal Anal Sphincter:  6.5 cm TUMOR DESCRIPTION Circumferential Extent: Right lateral wall from 6-11 o'clock positions. Tumor Length: 3.4 cm T - CATEGORY Extension through Muscularis Propria: Yes, along right lateral rectal wall measuring up to 5 mm = T3b Shortest Distance of any tumor/node from Mesorectal Fascia: 6 mm from right perirectal lymph node 2 right lateral mesorectal fascia Extramural Vascular Invasion/Tumor Thrombus: No Invasion of Anterior Peritoneal Reflection: No Involvement of Adjacent  Organs or Pelvic Sidewall: No Levator Ani Involvement: No N - CATEGORY Mesorectal Lymph Nodes >=55mm: Single 7 mm right perirectal lymph node = N1 Extra-mesorectal Lymphadenopathy: No = N2 Other:  None. IMPRESSION: Rectal adenocarcinoma T stage: T3b Rectal adenocarcinoma N stage:  N1 Distance from tumor to the internal anal sphincter is 6.5 cm. Electronically Signed   By: Earle Gell M.D.   On: 03/08/2019 14:42   Ct Abdomen Pelvis W Contrast  Result Date: 03/05/2019 CLINICAL DATA:  Newly diagnosed rectal adenocarcinoma EXAM: CT CHEST, ABDOMEN, AND PELVIS WITH CONTRAST TECHNIQUE: Multidetector CT imaging of the chest, abdomen and pelvis was performed following the standard protocol during bolus administration of intravenous contrast. CONTRAST:  162mL OMNIPAQUE IOHEXOL 300 MG/ML  SOLN, additional oral enteric contrast COMPARISON:  None. FINDINGS: CT CHEST FINDINGS Cardiovascular: Scattered atherosclerosis. Normal heart size. No pericardial effusion. Mediastinum/Nodes: No enlarged mediastinal, hilar, or axillary lymph nodes. Thyroid gland, trachea, and esophagus demonstrate no significant findings. Lungs/Pleura: There are multiple tiny nodules in the bilateral lung apices, for example a 3 mm nodule of the right upper lobe (series 3, image 26). No pleural effusion or pneumothorax. Musculoskeletal: No chest wall mass or suspicious bone lesions identified. Nonacute fractures of the left ribs. CT ABDOMEN PELVIS FINDINGS Hepatobiliary: No focal liver abnormality is seen. No gallstones, gallbladder wall thickening, or biliary dilatation. Pancreas: Unremarkable. No pancreatic ductal dilatation or surrounding inflammatory changes. Spleen: Normal in size without focal abnormality. Adrenals/Urinary Tract: There is a 1.0 cm nodule of the left adrenal gland, low-attenuation although not clearly benign and fat containing. Kidneys are normal, without renal calculi, focal lesion, or hydronephrosis. Bladder is unremarkable.  Stomach/Bowel: Stomach is within normal limits. Appendix is present and normal appearing in the right lower quadrant despite stated history of appendectomy (series 2, image 99). There is some eccentric soft tissue thickening about the low rectum (e.g. Series 2, image 108) without obvious mass. Occasional colonic diverticula. Vascular/Lymphatic: Mixed calcific atherosclerosis. No enlarged abdominal or pelvic lymph nodes. Reproductive: No mass or other abnormality. Status post hysterectomy. Other: No abdominal wall hernia or abnormality. No abdominopelvic ascites. Musculoskeletal: No acute or significant osseous findings. IMPRESSION: 1. There is some eccentric soft tissue thickening about the low rectum (e.g. Series 2, image 108) without obvious mass or lymphadenopathy. 2. No definite evidence of metastatic disease in the chest, abdomen, or pelvis. 3. There are multiple tiny nodules in the bilateral lung apices, for example a 3 mm nodule of the right upper lobe (series 3, image 26). Although nonspecific, these are unlikely manifestation of metastatic disease. Attention on follow-up. 4. There is a 1.0 cm nodule of the left adrenal gland, low-attenuation although not clearly benign and fat containing. Attention on follow-up. Electronically Signed   By: Eddie Candle M.D.   On: 03/05/2019 10:08       IMPRESSION/PLAN: 1. Stage IIIB, cT3bN1M0 adenocarcinoma of the rectum. Dr. Lisbeth Renshaw discusses the pathology findings and reviews the nature of rectal cancers. He reviews the staging and the rationale to consider chemoRT. She is planning to obtain Xeloda from the oncology speciality pharmacy.  We discussed the risks, benefits, short, and long term effects of radiotherapy, and the patient is interested in proceeding. Dr. Lisbeth Renshaw discusses the delivery and logistics of radiotherapy and recommends a course of 5 1/2 weeks of radiotherapy. She will come tomorrow at 8 am for simulation. We reviewed consent by phone verbally, and she  will sign this document tomorrow. A copy will be provided to her at that time.   In a visit lasting 45 minutes, greater than 50% of the time was spent face to face discussing her case, and coordinating the patient's care.  This encounter was provided by telemedicine platform Webex.  The patient has given verbal consent for this type of encounter and has been advised to only accept a meeting of this type in a secure network environment. The time spent during this encounter was 45 minutes. The attendants for this meeting include Dr. Lisbeth Renshaw,  Hayden Pedro  and [name] along with her husband Gershon Mussel.   During the encounter, Dr. Lisbeth Renshaw and  Hayden Pedro were located at [facility] Radiation Oncology Department.  [name] and her husband were  located at home.    The above documentation reflects my direct findings during this shared patient visit. Please see the separate note by Dr. Lisbeth Renshaw on this date for the remainder of the patient's plan of care.    Carola Rhine, PAC

## 2019-03-13 NOTE — Addendum Note (Signed)
Encounter addended by: Cori Razor, RN on: 03/13/2019 3:41 PM  Actions taken: Charge Capture section accepted

## 2019-03-13 NOTE — Telephone Encounter (Signed)
Oral Oncology Pharmacist Encounter  No insurance authorization is required for Xeloda at this time. Copayment for entire course of Xeloda is $693.05, this is not affordable to patient at this time. There are no available foundation grants for patient's diagnosis at this time. [employer] does support a Charity fundraiser use program for Xeloda, however, could lead to a delay in medication acquisition if we use this option.  Prescription has been e-scribed to [facility] as they have an in-house patient assistance program that can help cover the out of pocket costs for expensive specialty medicines.  We will continue to follow-up with outside pharmacy to ensure prompt delivery of Xeloda. Patient and husband have been updated about copayment and dispensing pharmacy change.  Johny Drilling, PharmD, BCPS, BCOP  03/13/2019 9:20 AM Oral Oncology Clinic [phone]

## 2019-03-14 ENCOUNTER — Ambulatory Visit
Admission: RE | Admit: 2019-03-14 | Discharge: 2019-03-14 | Disposition: A | Discharge status: Home / Self Care | Payer: PPO | Source: Ambulatory Visit | Attending: Radiation Oncology | Admitting: Radiation Oncology

## 2019-03-14 ENCOUNTER — Other Ambulatory Visit: Payer: Self-pay | Admitting: *Deleted

## 2019-03-14 ENCOUNTER — Encounter (HOSPITAL_COMMUNITY): Admission: RE | Payer: Self-pay | Source: Home / Self Care

## 2019-03-14 ENCOUNTER — Inpatient Hospital Stay (HOSPITAL_COMMUNITY): Admission: RE | Admit: 2019-03-14 | Payer: PPO | Source: Home / Self Care | Admitting: Surgery

## 2019-03-14 ENCOUNTER — Other Ambulatory Visit: Payer: Self-pay

## 2019-03-14 DIAGNOSIS — Z510 Encounter for antineoplastic radiation therapy: Secondary | ICD-10-CM | POA: Diagnosis not present

## 2019-03-14 DIAGNOSIS — C20 Malignant neoplasm of rectum: Secondary | ICD-10-CM | POA: Insufficient documentation

## 2019-03-14 LAB — TYPE AND SCREEN
ABO/RH(D): O POS
Antibody Screen: NEGATIVE

## 2019-03-14 SURGERY — RESECTION, RECTUM, LOW ANTERIOR, ROBOT-ASSISTED
Anesthesia: General

## 2019-03-14 MED ORDER — PROCHLORPERAZINE MALEATE 10 MG PO TABS: 10.0000 mg | ORAL_TABLET | Freq: Four times a day (QID) | ORAL | 0 refills | Status: DC | PRN

## 2019-03-14 NOTE — Telephone Encounter (Signed)
Oral Oncology Patient Advocate Encounter  [facility] is able to fill the Xeloda and provide copay assistance.  Xeloda copay is $0 and will be delivered to the patient on 03/15/19.  [place] [facility] [place] Phone [phone] Fax [phone] 03/14/2019   9:24 AM

## 2019-03-14 NOTE — Progress Notes (Signed)
Notified patient that Compazine script sent to her pharmacy in case needed for nausea. Likely she will not need it.

## 2019-03-15 NOTE — Progress Notes (Signed)
  Radiation Oncology         [phone] ________________________________  Name: Kathleen Flores MRN: [id number]  Date: 03/14/2019  DOB: 19-Dec-1945  Optical Surface Tracking Plan:  Since intensity modulated radiotherapy (IMRT) and 3D conformal radiation treatment methods are predicated on accurate and precise positioning for treatment, intrafraction motion monitoring is medically necessary to ensure accurate and safe treatment delivery.  The ability to quantify intrafraction motion without excessive ionizing radiation dose can only be performed with optical surface tracking. Accordingly, surface imaging offers the opportunity to obtain 3D measurements of patient position throughout IMRT and 3D treatments without excessive radiation exposure.  I am ordering optical surface tracking for this patient's upcoming course of radiotherapy. ________________________________  Kyung Rudd, MD 03/15/2019 10:35 AM    Reference:   Ursula Alert, J, et al. Surface imaging-based analysis of intrafraction motion for breast radiotherapy patients.Journal of [place], n. 6, nov. 2014. ISSN [id number].   Available at: <http://www.jacmp.org/index.php/jacmp/article/view/4957>.

## 2019-03-15 NOTE — Progress Notes (Signed)
  Radiation Oncology         [phone] ________________________________  Name: Kathleen Flores MRN: [id number]  Date: 03/14/2019  DOB: 11/20/1946   SIMULATION AND TREATMENT PLANNING NOTE  DIAGNOSIS:     ICD-10-CM   1. 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 The patient was placed in a supine position. A customized vac-lock bag was constructed to aid in patient immobilization on. This complex treatment device will be used on a daily basis during the treatment. In this fashion a CT scan was obtained through the pelvic region and the isocenter was placed near midline within the pelvis. Surface markings were placed.  The patient's imaging was loaded into the radiation treatment planning system. The patient will initially be planned to receive a course of radiation to a dose of 45 Gy. This will be accomplished in 25 fractions at 1.8 gray per fraction. This initial treatment will correspond to a 3-D conformal technique. The target has been contoured in addition to the rectum, bladder and femoral heads. Dose volume histograms of each of these structures have been requested and these will be carefully reviewed as part of the 3-D conformal treatment planning process. To accomplish this initial treatment, 4 customized blocks have been designed for this purpose. Each of these 4 complex treatment devices will be used on a daily basis during the initial course of the treatment. It is anticipated that the patient will then receive a boost for an additional 5.4 Gy. The anticipated total dose therefore will be 50.4 Gy.    Special treatment procedure The patient will receive chemotherapy during the course of radiation treatment. The patient may experience increased or overlapping toxicity due to this combined-modality approach and the patient will be monitored for such problems. This may  include extra lab work as necessary. This therefore constitutes a special treatment procedure.    ________________________________  Jodelle Gross, MD, PhD

## 2019-03-16 DIAGNOSIS — Z510 Encounter for antineoplastic radiation therapy: Secondary | ICD-10-CM | POA: Diagnosis not present

## 2019-03-16 DIAGNOSIS — C20 Malignant neoplasm of rectum: Secondary | ICD-10-CM | POA: Diagnosis not present

## 2019-03-19 ENCOUNTER — Ambulatory Visit
Admission: RE | Admit: 2019-03-19 | Discharge: 2019-03-19 | Disposition: A | Discharge status: Home / Self Care | Payer: PPO | Source: Ambulatory Visit | Attending: Radiation Oncology | Admitting: Radiation Oncology

## 2019-03-19 ENCOUNTER — Telehealth: Payer: Self-pay | Admitting: Pharmacist

## 2019-03-19 ENCOUNTER — Other Ambulatory Visit: Payer: Self-pay

## 2019-03-19 DIAGNOSIS — Z510 Encounter for antineoplastic radiation therapy: Secondary | ICD-10-CM | POA: Diagnosis not present

## 2019-03-19 DIAGNOSIS — C20 Malignant neoplasm of rectum: Secondary | ICD-10-CM | POA: Diagnosis not present

## 2019-03-19 NOTE — Telephone Encounter (Signed)
Oral Oncology Pharmacist Encounter  Received call from patient with some additional questions about Xeloda and radiation initiation. Patient states she initiated Xeloda this morning, 03/19/2019, and has already completed her first radiation treatment.  Patient questioning if she can go out in the sun.  Patient informed she does need to limit sun exposure in case she has increase in sun sensitivity and if she is out in the sun for prolonged period of time she should wear sunscreen.  Patient asking about medication interaction between magnesium and elderberry and Xeloda.  Patient states she takes magnesium as she sometimes gets constipated and it helps alleviate that issue.   No medication interaction between Xeloda and magnesium.   Patient informed that she may experience an increase in watery or loose stools as she gets into her treatment with Xeloda and radiation in which case she should minimize or discontinue the use of magnesium until her bowels become regular once again.  No medication interaction between Xeloda and elderberry. Patient questioning utility of Elderberry against COVID-19. Found some information detailing the use of elderberry specific to COVID-19 on the website for the [facility]. Kendall Flack is safe to take as a way to prevent infection with a virus that causes COVID-19 however, it has been shown to potentiate the complement cascade which can lead to more severe cases of ARDS with severe infections.  Elderberry should be discontinued at the first signs or symptoms of COVID-19 or if patients test positive.  Patient also with questions about how to get in touch with the cancer center if she is unable to be present for a radiation appointment if a tragedy should occur.  We discussed that we would handle any issues as they arise.  All questions answered. Patient knows to call the office with any additional questions or concerns.  Johny Drilling,  PharmD, BCPS, BCOP  03/19/2019 9:31 AM Oral Oncology Clinic [phone]

## 2019-03-20 ENCOUNTER — Ambulatory Visit
Admission: RE | Admit: 2019-03-20 | Discharge: 2019-03-20 | Disposition: A | Discharge status: Home / Self Care | Payer: PPO | Source: Ambulatory Visit | Attending: Radiation Oncology | Admitting: Radiation Oncology

## 2019-03-20 ENCOUNTER — Other Ambulatory Visit: Payer: Self-pay

## 2019-03-20 DIAGNOSIS — Z510 Encounter for antineoplastic radiation therapy: Secondary | ICD-10-CM | POA: Diagnosis not present

## 2019-03-20 DIAGNOSIS — C20 Malignant neoplasm of rectum: Secondary | ICD-10-CM | POA: Diagnosis not present

## 2019-03-21 ENCOUNTER — Other Ambulatory Visit: Payer: Self-pay

## 2019-03-21 ENCOUNTER — Ambulatory Visit
Admission: RE | Admit: 2019-03-21 | Discharge: 2019-03-21 | Disposition: A | Discharge status: Home / Self Care | Payer: PPO | Source: Ambulatory Visit | Attending: Radiation Oncology | Admitting: Radiation Oncology

## 2019-03-21 DIAGNOSIS — Z510 Encounter for antineoplastic radiation therapy: Secondary | ICD-10-CM | POA: Diagnosis not present

## 2019-03-21 DIAGNOSIS — C20 Malignant neoplasm of rectum: Secondary | ICD-10-CM | POA: Diagnosis not present

## 2019-03-22 ENCOUNTER — Other Ambulatory Visit: Payer: Self-pay

## 2019-03-22 ENCOUNTER — Ambulatory Visit
Admission: RE | Admit: 2019-03-22 | Discharge: 2019-03-22 | Disposition: A | Discharge status: Home / Self Care | Payer: PPO | Source: Ambulatory Visit | Attending: Radiation Oncology | Admitting: Radiation Oncology

## 2019-03-22 DIAGNOSIS — C20 Malignant neoplasm of rectum: Secondary | ICD-10-CM | POA: Diagnosis not present

## 2019-03-22 DIAGNOSIS — Z510 Encounter for antineoplastic radiation therapy: Secondary | ICD-10-CM | POA: Diagnosis not present

## 2019-03-23 ENCOUNTER — Ambulatory Visit
Admission: RE | Admit: 2019-03-23 | Discharge: 2019-03-23 | Disposition: A | Discharge status: Home / Self Care | Payer: PPO | Source: Ambulatory Visit | Attending: Radiation Oncology | Admitting: Radiation Oncology

## 2019-03-23 ENCOUNTER — Other Ambulatory Visit: Payer: Self-pay

## 2019-03-23 DIAGNOSIS — Z510 Encounter for antineoplastic radiation therapy: Secondary | ICD-10-CM | POA: Diagnosis not present

## 2019-03-23 DIAGNOSIS — C20 Malignant neoplasm of rectum: Secondary | ICD-10-CM | POA: Diagnosis not present

## 2019-03-23 NOTE — Progress Notes (Signed)
Pt here for patient teaching.  Pt given Radiation and You booklet.  Reviewed areas of pertinence such as diarrhea, fatigue, hair loss, sexual and fertility changes, skin changes and urinary and bladder changes . Pt able to give teach back of to pat skin, use unscented/gentle soap, use baby wipes, have Imodium on hand, drink plenty of water and sitz bath,avoid applying anything to skin within 4 hours of treatment. Pt verbalizes understanding of information given and will contact nursing with any questions or concerns.     Kollin Udell M. Tanyon Alipio RN, BSN     

## 2019-03-26 ENCOUNTER — Ambulatory Visit
Admission: RE | Admit: 2019-03-26 | Discharge: 2019-03-26 | Disposition: A | Discharge status: Home / Self Care | Payer: PPO | Source: Ambulatory Visit | Attending: Radiation Oncology | Admitting: Radiation Oncology

## 2019-03-26 ENCOUNTER — Other Ambulatory Visit: Payer: Self-pay

## 2019-03-26 DIAGNOSIS — C20 Malignant neoplasm of rectum: Secondary | ICD-10-CM | POA: Diagnosis not present

## 2019-03-26 DIAGNOSIS — Z510 Encounter for antineoplastic radiation therapy: Secondary | ICD-10-CM | POA: Diagnosis not present

## 2019-03-27 ENCOUNTER — Other Ambulatory Visit: Payer: Self-pay

## 2019-03-27 ENCOUNTER — Ambulatory Visit
Admission: RE | Admit: 2019-03-27 | Discharge: 2019-03-27 | Disposition: A | Discharge status: Home / Self Care | Payer: PPO | Source: Ambulatory Visit | Attending: Radiation Oncology | Admitting: Radiation Oncology

## 2019-03-27 DIAGNOSIS — Z510 Encounter for antineoplastic radiation therapy: Secondary | ICD-10-CM | POA: Diagnosis not present

## 2019-03-27 DIAGNOSIS — C20 Malignant neoplasm of rectum: Secondary | ICD-10-CM | POA: Diagnosis not present

## 2019-03-28 ENCOUNTER — Other Ambulatory Visit: Payer: Self-pay

## 2019-03-28 ENCOUNTER — Ambulatory Visit
Admission: RE | Admit: 2019-03-28 | Discharge: 2019-03-28 | Disposition: A | Discharge status: Home / Self Care | Payer: PPO | Source: Ambulatory Visit | Attending: Radiation Oncology | Admitting: Radiation Oncology

## 2019-03-28 DIAGNOSIS — Z510 Encounter for antineoplastic radiation therapy: Secondary | ICD-10-CM | POA: Diagnosis not present

## 2019-03-28 DIAGNOSIS — C20 Malignant neoplasm of rectum: Secondary | ICD-10-CM | POA: Diagnosis not present

## 2019-03-29 ENCOUNTER — Ambulatory Visit
Admission: RE | Admit: 2019-03-29 | Discharge: 2019-03-29 | Disposition: A | Discharge status: Home / Self Care | Payer: PPO | Source: Ambulatory Visit | Attending: Radiation Oncology | Admitting: Radiation Oncology

## 2019-03-29 ENCOUNTER — Other Ambulatory Visit: Payer: Self-pay

## 2019-03-29 DIAGNOSIS — C20 Malignant neoplasm of rectum: Secondary | ICD-10-CM | POA: Diagnosis not present

## 2019-03-29 DIAGNOSIS — Z510 Encounter for antineoplastic radiation therapy: Secondary | ICD-10-CM | POA: Diagnosis not present

## 2019-03-30 ENCOUNTER — Ambulatory Visit
Admission: RE | Admit: 2019-03-30 | Discharge: 2019-03-30 | Disposition: A | Discharge status: Home / Self Care | Payer: PPO | Source: Ambulatory Visit | Attending: Radiation Oncology | Admitting: Radiation Oncology

## 2019-03-30 ENCOUNTER — Inpatient Hospital Stay (HOSPITAL_BASED_OUTPATIENT_CLINIC_OR_DEPARTMENT_OTHER): Payer: PPO | Admitting: Nurse Practitioner

## 2019-03-30 ENCOUNTER — Inpatient Hospital Stay: Payer: PPO | Attending: Oncology

## 2019-03-30 ENCOUNTER — Other Ambulatory Visit: Payer: Self-pay

## 2019-03-30 ENCOUNTER — Encounter: Payer: Self-pay | Admitting: Nurse Practitioner

## 2019-03-30 ENCOUNTER — Telehealth: Payer: Self-pay | Admitting: Nurse Practitioner

## 2019-03-30 VITALS — BP 159/69 | HR 93 | Temp 98.3°F | Resp 18 | Ht 67.0 in | Wt 145.7 lb

## 2019-03-30 DIAGNOSIS — Z510 Encounter for antineoplastic radiation therapy: Secondary | ICD-10-CM | POA: Diagnosis not present

## 2019-03-30 DIAGNOSIS — C20 Malignant neoplasm of rectum: Secondary | ICD-10-CM | POA: Diagnosis not present

## 2019-03-30 DIAGNOSIS — R350 Frequency of micturition: Secondary | ICD-10-CM | POA: Diagnosis not present

## 2019-03-30 DIAGNOSIS — I10 Essential (primary) hypertension: Secondary | ICD-10-CM | POA: Insufficient documentation

## 2019-03-30 DIAGNOSIS — F419 Anxiety disorder, unspecified: Secondary | ICD-10-CM | POA: Diagnosis not present

## 2019-03-30 LAB — CBC WITH DIFFERENTIAL (CANCER CENTER ONLY)
Abs Immature Granulocytes: 0.01 10*3/uL (ref 0.00–0.07)
Basophils Absolute: 0 10*3/uL (ref 0.0–0.1)
Basophils Relative: 0 %
Eosinophils Absolute: 0.1 10*3/uL (ref 0.0–0.5)
Eosinophils Relative: 3 %
HCT: 43.4 % (ref 36.0–46.0)
Hemoglobin: 14.2 g/dL (ref 12.0–15.0)
Immature Granulocytes: 0 %
Lymphocytes Relative: 22 %
Lymphs Abs: 0.7 10*3/uL (ref 0.7–4.0)
MCH: 31.6 pg (ref 26.0–34.0)
MCHC: 32.7 g/dL (ref 30.0–36.0)
MCV: 96.7 fL (ref 80.0–100.0)
Monocytes Absolute: 0.2 10*3/uL (ref 0.1–1.0)
Monocytes Relative: 7 %
Neutro Abs: 2.2 10*3/uL (ref 1.7–7.7)
Neutrophils Relative %: 68 %
Platelet Count: 182 10*3/uL (ref 150–400)
RBC: 4.49 MIL/uL (ref 3.87–5.11)
RDW: 12.2 % (ref 11.5–15.5)
WBC Count: 3.2 10*3/uL — ABNORMAL LOW (ref 4.0–10.5)
nRBC: 0 % (ref 0.0–0.2)

## 2019-03-30 LAB — CMP (CANCER CENTER ONLY)
ALT: 10 U/L (ref 0–44)
AST: 15 U/L (ref 15–41)
Albumin: 4.2 g/dL (ref 3.5–5.0)
Alkaline Phosphatase: 81 U/L (ref 38–126)
Anion gap: 8 (ref 5–15)
BUN: 13 mg/dL (ref 8–23)
CO2: 27 mmol/L (ref 22–32)
Calcium: 9.2 mg/dL (ref 8.9–10.3)
Chloride: 103 mmol/L (ref 98–111)
Creatinine: 0.66 mg/dL (ref 0.44–1.00)
GFR, Est AFR Am: >60 mL/min (ref >60)
GFR, Estimated: >60 mL/min (ref >60)
Glucose, Bld: 97 mg/dL (ref 70–99)
Potassium: 4.3 mmol/L (ref 3.5–5.1)
Sodium: 138 mmol/L (ref 135–145)
Total Bilirubin: 0.7 mg/dL (ref 0.3–1.2)
Total Protein: 7.4 g/dL (ref 6.5–8.1)

## 2019-03-30 NOTE — Progress Notes (Addendum)
  Kathleen [facility] OFFICE PROGRESS NOTE   Diagnosis:  Rectal cancer  INTERVAL HISTORY:   Kathleen Flores returns as scheduled.  She began radiation/Xeloda 03/19/2019.  She denies nausea/vomiting.  No mouth sores.  She has had several bowel movements today.  No diarrhea.  No bleeding.  She reports a good appetite.  She denies pain.  No fever, cough, shortness of breath.  No hand or foot pain or redness.  She notes frequent urination.  No dysuria.  Objective:  Vital signs in last 24 hours:  Blood pressure (!) 159/69, pulse 93, temperature 98.3 F (36.8 C), temperature source Oral, resp. rate 18, height 5\' 7"  (1.702 m), weight 145 lb 11.2 oz (66.1 kg), SpO2 98 %.    HEENT: No thrush or ulcers. Resp: Respirations even and unlabored. Vascular: No leg edema.  Skin: Palms and soles with mild erythema.  No skin breakdown.   Lab Results:  Lab Results  Component Value Date   WBC 3.2 (L) 03/30/2019   HGB 14.2 03/30/2019   HCT 43.4 03/30/2019   MCV 96.7 03/30/2019   PLT 182 03/30/2019   NEUTROABS 2.2 03/30/2019    Imaging:  No results found.  Medications: I have reviewed the patient's current medications.  Assessment/Plan: 1. Rectal cancer ? Nonobstructing mass in the mid rectum on colonoscopy 02/28/2019, 7 cm from the anal verge, biopsy confirmed adenocarcinoma-at least intramucosal ? CTs 03/05/2019- eccentric soft tissue thickening at the low rectum, multiple tiny lung nodules-nonspecific, 1 cm left adrenal nodule, no definite evidence of metastatic disease ? MR pelvis 03/08/2019- tumor at 6.5 cm from the internal anal sphincter, T3b,N1 (single 7 mm right perirectal lymph node) ? Radiation/Xeloda 03/19/2019 2. Hypertension 3. Anxiety  Disposition: Kathleen Flores appears stable.  She continues radiation/Xeloda.  She is tolerating Xeloda well.  We reviewed the CBC from today.  Counts adequate to continue treatment as above.  She will return for lab and follow-up in 2 weeks.  She  will contact the office in the interim with any problems.  Patient seen with Dr. Benay Spice.    Ned Card ANP/GNP-BC   03/30/2019  9:26 AM  This was a shared visit with Ned Card.  Kathleen Flores is tolerating the chemotherapy and radiation well.  She will return for an office visit in 2 weeks.  [name], MD

## 2019-03-30 NOTE — Telephone Encounter (Deleted)
Scheduled appt per 4/17 los.  Los stated to schedule the f/u for 9:15 on 5/1 patient already has an radiation appt scheduled for that day, so went ahead and just scheduled the lab and md visit after her radiation appt on 5/1.

## 2019-03-30 NOTE — Telephone Encounter (Signed)
Scheduled appt per 4/17 los. °

## 2019-04-02 ENCOUNTER — Other Ambulatory Visit: Payer: Self-pay

## 2019-04-02 ENCOUNTER — Ambulatory Visit
Admission: RE | Admit: 2019-04-02 | Discharge: 2019-04-02 | Disposition: A | Discharge status: Home / Self Care | Payer: PPO | Source: Ambulatory Visit | Attending: Radiation Oncology | Admitting: Radiation Oncology

## 2019-04-02 DIAGNOSIS — C20 Malignant neoplasm of rectum: Secondary | ICD-10-CM | POA: Diagnosis not present

## 2019-04-02 DIAGNOSIS — Z510 Encounter for antineoplastic radiation therapy: Secondary | ICD-10-CM | POA: Diagnosis not present

## 2019-04-03 ENCOUNTER — Other Ambulatory Visit: Payer: Self-pay

## 2019-04-03 ENCOUNTER — Ambulatory Visit
Admission: RE | Admit: 2019-04-03 | Discharge: 2019-04-03 | Disposition: A | Discharge status: Home / Self Care | Payer: PPO | Source: Ambulatory Visit | Attending: Radiation Oncology | Admitting: Radiation Oncology

## 2019-04-03 DIAGNOSIS — Z510 Encounter for antineoplastic radiation therapy: Secondary | ICD-10-CM | POA: Diagnosis not present

## 2019-04-03 DIAGNOSIS — C20 Malignant neoplasm of rectum: Secondary | ICD-10-CM | POA: Diagnosis not present

## 2019-04-04 ENCOUNTER — Other Ambulatory Visit: Payer: Self-pay

## 2019-04-04 ENCOUNTER — Ambulatory Visit
Admission: RE | Admit: 2019-04-04 | Discharge: 2019-04-04 | Disposition: A | Discharge status: Home / Self Care | Payer: PPO | Source: Ambulatory Visit | Attending: Radiation Oncology | Admitting: Radiation Oncology

## 2019-04-04 DIAGNOSIS — Z510 Encounter for antineoplastic radiation therapy: Secondary | ICD-10-CM | POA: Diagnosis not present

## 2019-04-04 DIAGNOSIS — C20 Malignant neoplasm of rectum: Secondary | ICD-10-CM | POA: Diagnosis not present

## 2019-04-05 ENCOUNTER — Other Ambulatory Visit: Payer: Self-pay

## 2019-04-05 ENCOUNTER — Ambulatory Visit
Admission: RE | Admit: 2019-04-05 | Discharge: 2019-04-05 | Disposition: A | Discharge status: Home / Self Care | Payer: PPO | Source: Ambulatory Visit | Attending: Radiation Oncology | Admitting: Radiation Oncology

## 2019-04-05 DIAGNOSIS — C20 Malignant neoplasm of rectum: Secondary | ICD-10-CM | POA: Diagnosis not present

## 2019-04-05 DIAGNOSIS — Z510 Encounter for antineoplastic radiation therapy: Secondary | ICD-10-CM | POA: Diagnosis not present

## 2019-04-06 ENCOUNTER — Other Ambulatory Visit: Payer: Self-pay

## 2019-04-06 ENCOUNTER — Ambulatory Visit: Admission: RE | Admit: 2019-04-06 | Payer: PPO | Source: Ambulatory Visit

## 2019-04-09 ENCOUNTER — Other Ambulatory Visit: Payer: Self-pay

## 2019-04-09 ENCOUNTER — Telehealth: Payer: Self-pay | Admitting: Radiation Oncology

## 2019-04-09 ENCOUNTER — Ambulatory Visit
Admission: RE | Admit: 2019-04-09 | Discharge: 2019-04-09 | Disposition: A | Discharge status: Home / Self Care | Payer: PPO | Source: Ambulatory Visit | Attending: Radiation Oncology | Admitting: Radiation Oncology

## 2019-04-09 DIAGNOSIS — C20 Malignant neoplasm of rectum: Secondary | ICD-10-CM | POA: Diagnosis not present

## 2019-04-09 DIAGNOSIS — Z510 Encounter for antineoplastic radiation therapy: Secondary | ICD-10-CM | POA: Diagnosis not present

## 2019-04-09 NOTE — Telephone Encounter (Signed)
Patient stopped Harlow Asa, RN in the hallway complaining of rectal burning. I understand the patient reported to [place], RN that she was using vaseline for relief. Phoned patient's cell. Instructed patient to stop using Vaseline. Per Shona Simpson, PA-C I instructed the patient to use dermaplast spray or preparation H or both to relieve rectal burning. Patient verbalized understanding and expressed appreciation for the call.

## 2019-04-10 ENCOUNTER — Ambulatory Visit
Admission: RE | Admit: 2019-04-10 | Discharge: 2019-04-10 | Disposition: A | Discharge status: Home / Self Care | Payer: PPO | Source: Ambulatory Visit | Attending: Radiation Oncology | Admitting: Radiation Oncology

## 2019-04-10 ENCOUNTER — Other Ambulatory Visit: Payer: Self-pay

## 2019-04-10 DIAGNOSIS — Z510 Encounter for antineoplastic radiation therapy: Secondary | ICD-10-CM | POA: Diagnosis not present

## 2019-04-10 DIAGNOSIS — C20 Malignant neoplasm of rectum: Secondary | ICD-10-CM | POA: Diagnosis not present

## 2019-04-11 ENCOUNTER — Ambulatory Visit
Admission: RE | Admit: 2019-04-11 | Discharge: 2019-04-11 | Disposition: A | Discharge status: Home / Self Care | Payer: PPO | Source: Ambulatory Visit | Attending: Radiation Oncology | Admitting: Radiation Oncology

## 2019-04-11 ENCOUNTER — Other Ambulatory Visit: Payer: Self-pay

## 2019-04-11 DIAGNOSIS — C20 Malignant neoplasm of rectum: Secondary | ICD-10-CM | POA: Diagnosis not present

## 2019-04-11 DIAGNOSIS — Z510 Encounter for antineoplastic radiation therapy: Secondary | ICD-10-CM | POA: Diagnosis not present

## 2019-04-12 ENCOUNTER — Ambulatory Visit: Payer: PPO

## 2019-04-13 ENCOUNTER — Inpatient Hospital Stay (HOSPITAL_BASED_OUTPATIENT_CLINIC_OR_DEPARTMENT_OTHER): Payer: PPO | Admitting: Nurse Practitioner

## 2019-04-13 ENCOUNTER — Encounter: Payer: Self-pay | Admitting: Nurse Practitioner

## 2019-04-13 ENCOUNTER — Ambulatory Visit
Admission: RE | Admit: 2019-04-13 | Discharge: 2019-04-13 | Disposition: A | Discharge status: Home / Self Care | Payer: PPO | Source: Ambulatory Visit | Attending: Radiation Oncology | Admitting: Radiation Oncology

## 2019-04-13 ENCOUNTER — Telehealth: Payer: Self-pay | Admitting: Nurse Practitioner

## 2019-04-13 ENCOUNTER — Inpatient Hospital Stay: Payer: PPO | Attending: Oncology

## 2019-04-13 ENCOUNTER — Other Ambulatory Visit: Payer: Self-pay | Admitting: Radiation Oncology

## 2019-04-13 ENCOUNTER — Other Ambulatory Visit: Payer: Self-pay

## 2019-04-13 ENCOUNTER — Telehealth: Payer: Self-pay | Admitting: *Deleted

## 2019-04-13 ENCOUNTER — Ambulatory Visit: Payer: PPO | Admitting: Nurse Practitioner

## 2019-04-13 ENCOUNTER — Other Ambulatory Visit: Payer: PPO

## 2019-04-13 VITALS — BP 154/69 | HR 98 | Temp 98.1°F | Resp 18 | Ht 67.0 in | Wt 146.4 lb

## 2019-04-13 DIAGNOSIS — Z79899 Other long term (current) drug therapy: Secondary | ICD-10-CM | POA: Insufficient documentation

## 2019-04-13 DIAGNOSIS — R918 Other nonspecific abnormal finding of lung field: Secondary | ICD-10-CM | POA: Insufficient documentation

## 2019-04-13 DIAGNOSIS — C20 Malignant neoplasm of rectum: Secondary | ICD-10-CM | POA: Diagnosis not present

## 2019-04-13 DIAGNOSIS — Z510 Encounter for antineoplastic radiation therapy: Secondary | ICD-10-CM | POA: Insufficient documentation

## 2019-04-13 DIAGNOSIS — F419 Anxiety disorder, unspecified: Secondary | ICD-10-CM | POA: Insufficient documentation

## 2019-04-13 DIAGNOSIS — I10 Essential (primary) hypertension: Secondary | ICD-10-CM | POA: Insufficient documentation

## 2019-04-13 DIAGNOSIS — E279 Disorder of adrenal gland, unspecified: Secondary | ICD-10-CM | POA: Diagnosis not present

## 2019-04-13 LAB — CMP (CANCER CENTER ONLY)
ALT: 11 U/L (ref 0–44)
AST: 13 U/L — ABNORMAL LOW (ref 15–41)
Albumin: 3.7 g/dL (ref 3.5–5.0)
Alkaline Phosphatase: 70 U/L (ref 38–126)
Anion gap: 8 (ref 5–15)
BUN: 16 mg/dL (ref 8–23)
CO2: 27 mmol/L (ref 22–32)
Calcium: 8.9 mg/dL (ref 8.9–10.3)
Chloride: 103 mmol/L (ref 98–111)
Creatinine: 0.6 mg/dL (ref 0.44–1.00)
GFR, Est AFR Am: >60 mL/min (ref >60)
GFR, Estimated: >60 mL/min (ref >60)
Glucose, Bld: 100 mg/dL — ABNORMAL HIGH (ref 70–99)
Potassium: 4.2 mmol/L (ref 3.5–5.1)
Sodium: 138 mmol/L (ref 135–145)
Total Bilirubin: 0.9 mg/dL (ref 0.3–1.2)
Total Protein: 6.9 g/dL (ref 6.5–8.1)

## 2019-04-13 LAB — CBC WITH DIFFERENTIAL (CANCER CENTER ONLY)
Abs Immature Granulocytes: 0.01 10*3/uL (ref 0.00–0.07)
Basophils Absolute: 0 10*3/uL (ref 0.0–0.1)
Basophils Relative: 1 %
Eosinophils Absolute: 0.3 10*3/uL (ref 0.0–0.5)
Eosinophils Relative: 10 %
HCT: 39.9 % (ref 36.0–46.0)
Hemoglobin: 13.4 g/dL (ref 12.0–15.0)
Immature Granulocytes: 0 %
Lymphocytes Relative: 11 %
Lymphs Abs: 0.3 10*3/uL — ABNORMAL LOW (ref 0.7–4.0)
MCH: 31.8 pg (ref 26.0–34.0)
MCHC: 33.6 g/dL (ref 30.0–36.0)
MCV: 94.8 fL (ref 80.0–100.0)
Monocytes Absolute: 0.4 10*3/uL (ref 0.1–1.0)
Monocytes Relative: 11 %
Neutro Abs: 2.2 10*3/uL (ref 1.7–7.7)
Neutrophils Relative %: 67 %
Platelet Count: 162 10*3/uL (ref 150–400)
RBC: 4.21 MIL/uL (ref 3.87–5.11)
RDW: 13.2 % (ref 11.5–15.5)
WBC Count: 3.2 10*3/uL — ABNORMAL LOW (ref 4.0–10.5)
nRBC: 0 % (ref 0.0–0.2)

## 2019-04-13 MED ORDER — HYDROCORTISONE ACE-PRAMOXINE 1-1 % RE FOAM: 1.0000 | Freq: Two times a day (BID) | RECTAL | 1 refills | Status: DC

## 2019-04-13 MED ORDER — HYDROCORTISONE ACETATE 25 MG RE SUPP: 25.0000 mg | Freq: Two times a day (BID) | RECTAL | 0 refills | Status: DC

## 2019-04-13 NOTE — Progress Notes (Signed)
  [place] OFFICE PROGRESS NOTE   Diagnosis: Rectal cancer  INTERVAL HISTORY:   Ms. Guimond returns as scheduled.  She continues radiation/Xeloda.  She denies nausea/vomiting.  No mouth sores.  No diarrhea.  She has intermittent frequent soft formed stools.  She notes burning at the rectum with bowel movements.  No pain otherwise.  No diarrhea.  She has an intermittent "burning" sensation in her stomach.  Objective:  Vital signs in last 24 hours:  Blood pressure (!) 154/69, pulse 98, temperature 98.1 F (36.7 C), temperature source Oral, resp. rate 18, height 5\' 7"  (1.702 m), weight 146 lb 6.4 oz (66.4 kg), SpO2 100 %.    HEENT: No thrush or ulcers. GI: Abdomen is soft and nontender.  No hepatomegaly.  No distention. Vascular: No leg edema.  Calves soft and nontender. Skin: Palms and soles with mild erythema.  No skin breakdown.   Lab Results:  Lab Results  Component Value Date   WBC 3.2 (L) 04/13/2019   HGB 13.4 04/13/2019   HCT 39.9 04/13/2019   MCV 94.8 04/13/2019   PLT 162 04/13/2019   NEUTROABS 2.2 04/13/2019    Imaging:  No results found.  Medications: I have reviewed the patient's current medications.  Assessment/Plan: 1. Rectal cancer ? Nonobstructing mass in the mid rectum on colonoscopy 02/28/2019, 7 cm from the anal verge, biopsy confirmed adenocarcinoma-at least intramucosal ? CTs 03/05/2019- eccentric soft tissue thickening at the low rectum, multiple tiny lung nodules-nonspecific, 1 cm left adrenal nodule, no definite evidence of metastatic disease ? MR pelvis 03/08/2019- tumor at 6.5 cm from the internal anal sphincter, T3b,N1(single 7 mm right perirectal lymph node) ? Radiation/Xeloda 03/19/2019 2. Hypertension 3. Anxiety  Disposition: Ms. Wisdom appears stable.  She continues radiation and Xeloda.  She overall is tolerating well.  She will try Pepcid for the periodic burning abdominal discomfort.  We reviewed the CBC from today.   Counts adequate to continue treatment.  She will return for lab and follow-up in approximately 2 weeks.  She will contact the office in the interim with any problems.    Ned Card ANP/GNP-BC   04/13/2019  9:41 AM

## 2019-04-13 NOTE — Telephone Encounter (Signed)
Scheduled appt per 5/1 los. ° °Patient aware of appt date and time. °

## 2019-04-13 NOTE — Telephone Encounter (Signed)
Returned the patient's call and let her know that a prescription for hydrocortisone suppositories will be sent in to her pharmacy [facility]).  Will continue to follow as necessary.  Gloriajean Dell. Leonie Green, BSN

## 2019-04-16 ENCOUNTER — Ambulatory Visit
Admission: RE | Admit: 2019-04-16 | Discharge: 2019-04-16 | Disposition: A | Discharge status: Home / Self Care | Payer: PPO | Source: Ambulatory Visit | Attending: Radiation Oncology | Admitting: Radiation Oncology

## 2019-04-16 ENCOUNTER — Other Ambulatory Visit: Payer: Self-pay

## 2019-04-16 ENCOUNTER — Other Ambulatory Visit: Payer: Self-pay | Admitting: Radiation Oncology

## 2019-04-16 ENCOUNTER — Telehealth: Payer: Self-pay | Admitting: Radiation Oncology

## 2019-04-16 DIAGNOSIS — Z510 Encounter for antineoplastic radiation therapy: Secondary | ICD-10-CM | POA: Diagnosis not present

## 2019-04-16 DIAGNOSIS — C20 Malignant neoplasm of rectum: Secondary | ICD-10-CM | POA: Diagnosis not present

## 2019-04-16 MED ORDER — HYDROCORT-PRAMOXINE (PERIANAL) 1-1 % EX FOAM: 1.0000 | Freq: Two times a day (BID) | CUTANEOUS | 2 refills | Status: DC

## 2019-04-16 NOTE — Telephone Encounter (Signed)
I called and spoke with the patient to let her know I called in proctofoam as well to try.

## 2019-04-17 ENCOUNTER — Ambulatory Visit
Admission: RE | Admit: 2019-04-17 | Discharge: 2019-04-17 | Disposition: A | Discharge status: Home / Self Care | Payer: PPO | Source: Ambulatory Visit | Attending: Radiation Oncology | Admitting: Radiation Oncology

## 2019-04-17 ENCOUNTER — Other Ambulatory Visit: Payer: Self-pay

## 2019-04-17 DIAGNOSIS — C20 Malignant neoplasm of rectum: Secondary | ICD-10-CM | POA: Diagnosis not present

## 2019-04-17 DIAGNOSIS — Z510 Encounter for antineoplastic radiation therapy: Secondary | ICD-10-CM | POA: Diagnosis not present

## 2019-04-18 ENCOUNTER — Ambulatory Visit
Admission: RE | Admit: 2019-04-18 | Discharge: 2019-04-18 | Disposition: A | Discharge status: Home / Self Care | Payer: PPO | Source: Ambulatory Visit | Attending: Radiation Oncology | Admitting: Radiation Oncology

## 2019-04-18 ENCOUNTER — Other Ambulatory Visit: Payer: Self-pay

## 2019-04-18 DIAGNOSIS — C20 Malignant neoplasm of rectum: Secondary | ICD-10-CM | POA: Diagnosis not present

## 2019-04-18 DIAGNOSIS — Z510 Encounter for antineoplastic radiation therapy: Secondary | ICD-10-CM | POA: Diagnosis not present

## 2019-04-19 ENCOUNTER — Ambulatory Visit
Admission: RE | Admit: 2019-04-19 | Discharge: 2019-04-19 | Disposition: A | Discharge status: Home / Self Care | Payer: PPO | Source: Ambulatory Visit | Attending: Radiation Oncology | Admitting: Radiation Oncology

## 2019-04-19 ENCOUNTER — Other Ambulatory Visit: Payer: Self-pay

## 2019-04-19 DIAGNOSIS — C20 Malignant neoplasm of rectum: Secondary | ICD-10-CM | POA: Diagnosis not present

## 2019-04-19 DIAGNOSIS — Z510 Encounter for antineoplastic radiation therapy: Secondary | ICD-10-CM | POA: Diagnosis not present

## 2019-04-20 ENCOUNTER — Ambulatory Visit
Admission: RE | Admit: 2019-04-20 | Discharge: 2019-04-20 | Disposition: A | Discharge status: Home / Self Care | Payer: PPO | Source: Ambulatory Visit | Attending: Radiation Oncology | Admitting: Radiation Oncology

## 2019-04-20 ENCOUNTER — Other Ambulatory Visit: Payer: Self-pay

## 2019-04-20 ENCOUNTER — Other Ambulatory Visit: Payer: Self-pay | Admitting: Radiation Oncology

## 2019-04-20 DIAGNOSIS — C20 Malignant neoplasm of rectum: Secondary | ICD-10-CM | POA: Diagnosis not present

## 2019-04-20 DIAGNOSIS — Z510 Encounter for antineoplastic radiation therapy: Secondary | ICD-10-CM | POA: Diagnosis not present

## 2019-04-20 MED ORDER — HYDROCORTISONE ACETATE 25 MG RE SUPP: 25.0000 mg | Freq: Two times a day (BID) | RECTAL | 0 refills | Status: DC

## 2019-04-23 ENCOUNTER — Other Ambulatory Visit: Payer: Self-pay

## 2019-04-23 ENCOUNTER — Ambulatory Visit: Payer: PPO

## 2019-04-23 ENCOUNTER — Ambulatory Visit
Admission: RE | Admit: 2019-04-23 | Discharge: 2019-04-23 | Disposition: A | Discharge status: Home / Self Care | Payer: PPO | Source: Ambulatory Visit | Attending: Radiation Oncology | Admitting: Radiation Oncology

## 2019-04-23 DIAGNOSIS — Z510 Encounter for antineoplastic radiation therapy: Secondary | ICD-10-CM | POA: Diagnosis not present

## 2019-04-23 DIAGNOSIS — C20 Malignant neoplasm of rectum: Secondary | ICD-10-CM | POA: Diagnosis not present

## 2019-04-24 ENCOUNTER — Ambulatory Visit: Payer: PPO

## 2019-04-24 ENCOUNTER — Ambulatory Visit
Admission: RE | Admit: 2019-04-24 | Discharge: 2019-04-24 | Disposition: A | Discharge status: Home / Self Care | Payer: PPO | Source: Ambulatory Visit | Attending: Radiation Oncology | Admitting: Radiation Oncology

## 2019-04-24 ENCOUNTER — Other Ambulatory Visit: Payer: Self-pay

## 2019-04-24 DIAGNOSIS — C20 Malignant neoplasm of rectum: Secondary | ICD-10-CM | POA: Diagnosis not present

## 2019-04-24 DIAGNOSIS — Z510 Encounter for antineoplastic radiation therapy: Secondary | ICD-10-CM | POA: Diagnosis not present

## 2019-04-25 ENCOUNTER — Other Ambulatory Visit: Payer: Self-pay

## 2019-04-25 ENCOUNTER — Ambulatory Visit: Payer: PPO

## 2019-04-25 ENCOUNTER — Inpatient Hospital Stay: Payer: PPO

## 2019-04-25 ENCOUNTER — Ambulatory Visit
Admission: RE | Admit: 2019-04-25 | Discharge: 2019-04-25 | Disposition: A | Discharge status: Home / Self Care | Payer: PPO | Source: Ambulatory Visit | Attending: Radiation Oncology | Admitting: Radiation Oncology

## 2019-04-25 DIAGNOSIS — C20 Malignant neoplasm of rectum: Secondary | ICD-10-CM

## 2019-04-25 DIAGNOSIS — Z510 Encounter for antineoplastic radiation therapy: Secondary | ICD-10-CM | POA: Diagnosis not present

## 2019-04-25 LAB — CBC WITH DIFFERENTIAL (CANCER CENTER ONLY)
Abs Immature Granulocytes: 0.01 10*3/uL (ref 0.00–0.07)
Basophils Absolute: 0 10*3/uL (ref 0.0–0.1)
Basophils Relative: 1 %
Eosinophils Absolute: 0.3 10*3/uL (ref 0.0–0.5)
Eosinophils Relative: 8 %
HCT: 39 % (ref 36.0–46.0)
Hemoglobin: 12.8 g/dL (ref 12.0–15.0)
Immature Granulocytes: 0 %
Lymphocytes Relative: 8 %
Lymphs Abs: 0.3 10*3/uL — ABNORMAL LOW (ref 0.7–4.0)
MCH: 32.2 pg (ref 26.0–34.0)
MCHC: 32.8 g/dL (ref 30.0–36.0)
MCV: 98.2 fL (ref 80.0–100.0)
Monocytes Absolute: 0.7 10*3/uL (ref 0.1–1.0)
Monocytes Relative: 17 %
Neutro Abs: 2.7 10*3/uL (ref 1.7–7.7)
Neutrophils Relative %: 66 %
Platelet Count: 274 10*3/uL (ref 150–400)
RBC: 3.97 MIL/uL (ref 3.87–5.11)
RDW: 14.6 % (ref 11.5–15.5)
WBC Count: 4.1 10*3/uL (ref 4.0–10.5)
nRBC: 0 % (ref 0.0–0.2)

## 2019-04-25 LAB — CMP (CANCER CENTER ONLY)
ALT: 11 U/L (ref 0–44)
AST: 15 U/L (ref 15–41)
Albumin: 3.4 g/dL — ABNORMAL LOW (ref 3.5–5.0)
Alkaline Phosphatase: 80 U/L (ref 38–126)
Anion gap: 9 (ref 5–15)
BUN: 9 mg/dL (ref 8–23)
CO2: 28 mmol/L (ref 22–32)
Calcium: 9.1 mg/dL (ref 8.9–10.3)
Chloride: 98 mmol/L (ref 98–111)
Creatinine: 0.68 mg/dL (ref 0.44–1.00)
GFR, Est AFR Am: >60 mL/min (ref >60)
GFR, Estimated: >60 mL/min (ref >60)
Glucose, Bld: 104 mg/dL — ABNORMAL HIGH (ref 70–99)
Potassium: 3.8 mmol/L (ref 3.5–5.1)
Sodium: 135 mmol/L (ref 135–145)
Total Bilirubin: 0.8 mg/dL (ref 0.3–1.2)
Total Protein: 7.1 g/dL (ref 6.5–8.1)

## 2019-04-26 ENCOUNTER — Ambulatory Visit
Admission: RE | Admit: 2019-04-26 | Discharge: 2019-04-26 | Disposition: A | Discharge status: Home / Self Care | Payer: PPO | Source: Ambulatory Visit | Attending: Radiation Oncology | Admitting: Radiation Oncology

## 2019-04-26 ENCOUNTER — Inpatient Hospital Stay (HOSPITAL_BASED_OUTPATIENT_CLINIC_OR_DEPARTMENT_OTHER): Payer: PPO | Admitting: Oncology

## 2019-04-26 ENCOUNTER — Telehealth: Payer: Self-pay | Admitting: Oncology

## 2019-04-26 ENCOUNTER — Other Ambulatory Visit: Payer: Self-pay

## 2019-04-26 ENCOUNTER — Ambulatory Visit: Payer: PPO

## 2019-04-26 VITALS — BP 150/70 | HR 87 | Temp 98.3°F | Resp 18 | Ht 67.0 in | Wt 144.8 lb

## 2019-04-26 DIAGNOSIS — I10 Essential (primary) hypertension: Secondary | ICD-10-CM

## 2019-04-26 DIAGNOSIS — C20 Malignant neoplasm of rectum: Secondary | ICD-10-CM | POA: Diagnosis not present

## 2019-04-26 DIAGNOSIS — R918 Other nonspecific abnormal finding of lung field: Secondary | ICD-10-CM

## 2019-04-26 DIAGNOSIS — Z79899 Other long term (current) drug therapy: Secondary | ICD-10-CM

## 2019-04-26 DIAGNOSIS — Z510 Encounter for antineoplastic radiation therapy: Secondary | ICD-10-CM | POA: Diagnosis not present

## 2019-04-26 DIAGNOSIS — F419 Anxiety disorder, unspecified: Secondary | ICD-10-CM

## 2019-04-26 DIAGNOSIS — E279 Disorder of adrenal gland, unspecified: Secondary | ICD-10-CM

## 2019-04-26 NOTE — Telephone Encounter (Signed)
Scheduled appt per 5/14 los. ° °A calendar will be mailed out. °

## 2019-04-26 NOTE — Progress Notes (Signed)
  [place] OFFICE PROGRESS NOTE   Diagnosis: Rectal cancer  INTERVAL HISTORY:   Ms. Haviland returns as scheduled.  She continues Xeloda and radiation.  She finished her final dose of Xeloda yesterday and will complete radiation tomorrow.  No mouth sores or hand/foot pain.  She has "burning "in the upper abdomen and with bowel movements.  No nausea or vomiting.  She has intermittent diarrhea up to 2-3 times per day, relieved with Imodium.  Objective:  Vital signs in last 24 hours:  Blood pressure (!) 150/70, pulse 87, temperature 98.3 F (36.8 C), temperature source Oral, resp. rate 18, height 5\' 7"  (1.702 m), weight 144 lb 12.8 oz (65.7 kg), SpO2 100 %.    HEENT: No thrush or ulcers Resp: Lungs clear bilaterally Cardio: Regular rate and rhythm, 2/6 systolic murmur GI: No hepatomegaly, no mass, nontender Vascular: No leg edema  Skin: Mild erythema at the palms, no skin breakdown, dryness and mild hyperpigmentation of the soles    Lab Results:  Lab Results  Component Value Date   WBC 4.1 04/25/2019   HGB 12.8 04/25/2019   HCT 39.0 04/25/2019   MCV 98.2 04/25/2019   PLT 274 04/25/2019   NEUTROABS 2.7 04/25/2019    CMP  Lab Results  Component Value Date   NA 135 04/25/2019   K 3.8 04/25/2019   CL 98 04/25/2019   CO2 28 04/25/2019   GLUCOSE 104 (H) 04/25/2019   BUN 9 04/25/2019   CREATININE 0.68 04/25/2019   CALCIUM 9.1 04/25/2019   PROT 7.1 04/25/2019   ALBUMIN 3.4 (L) 04/25/2019   AST 15 04/25/2019   ALT 11 04/25/2019   ALKPHOS 80 04/25/2019   BILITOT 0.8 04/25/2019   GFRNONAA >60 04/25/2019   GFRAA >60 04/25/2019     Medications: I have reviewed the patient's current medications.   Assessment/Plan:  sessment/Plan: 1. Rectal cancer ? Nonobstructing mass in the mid rectum on colonoscopy 02/28/2019, 7 cm from the anal verge, biopsy confirmed adenocarcinoma-at least intramucosal ? CTs 03/05/2019- eccentric soft tissue thickening at the low  rectum, multiple tiny lung nodules-nonspecific, 1 cm left adrenal nodule, no definite evidence of metastatic disease ? MR pelvis 03/08/2019- tumor at 6.5 cm from the internal anal sphincter, T3b,N1(single 7 mm right perirectal lymph node) ? Radiation/Xeloda 03/19/2019-04/27/2019, Xeloda completed 04/25/2019 2. Hypertension 3. Anxiety   Disposition: Ms. Mewborn will complete the course of neoadjuvant therapy tomorrow.  She has tolerated treatment well.  I suspect the rectal discomfort will improve over the next several weeks.  She will schedule appoint with Dr. Johney Maine for surgical planning.  I will see her after surgery to review the final pathology and discuss adjuvant treatment options.  Betsy Coder, MD  04/26/2019  8:21 AM

## 2019-04-27 ENCOUNTER — Other Ambulatory Visit: Payer: Self-pay

## 2019-04-27 ENCOUNTER — Ambulatory Visit
Admission: RE | Admit: 2019-04-27 | Discharge: 2019-04-27 | Disposition: A | Discharge status: Home / Self Care | Payer: PPO | Source: Ambulatory Visit | Attending: Radiation Oncology | Admitting: Radiation Oncology

## 2019-04-27 ENCOUNTER — Encounter: Payer: Self-pay | Admitting: Radiation Oncology

## 2019-04-27 DIAGNOSIS — C20 Malignant neoplasm of rectum: Secondary | ICD-10-CM | POA: Diagnosis not present

## 2019-04-27 DIAGNOSIS — Z510 Encounter for antineoplastic radiation therapy: Secondary | ICD-10-CM | POA: Diagnosis not present

## 2019-05-03 ENCOUNTER — Other Ambulatory Visit: Payer: Self-pay | Admitting: *Deleted

## 2019-05-03 MED ORDER — HYDROCORTISONE ACETATE 25 MG RE SUPP: 25.0000 mg | Freq: Two times a day (BID) | RECTAL | 1 refills | Status: DC

## 2019-05-23 ENCOUNTER — Telehealth: Payer: Self-pay | Admitting: Radiation Oncology

## 2019-05-23 ENCOUNTER — Ambulatory Visit: Payer: Self-pay | Admitting: Surgery

## 2019-05-23 ENCOUNTER — Inpatient Hospital Stay: Admit: 2019-05-23 | Payer: PPO | Admitting: Surgery

## 2019-05-23 SURGERY — RESECTION, RECTUM, LOW ANTERIOR, ROBOT-ASSISTED
Anesthesia: General

## 2019-05-23 NOTE — H&P (Signed)
[name] Documented: 03/06/2019 8:43 AM Location: [facility] Patient #: [id number] DOB: November 06, 1946 Married / Language: [name] / Race: White Female   History of Present Illness Adin Hector MD; 03/06/2019 9:35 AM) The patient is a 73 year old female who presents with colorectal cancer. Note for "Colorectal cancer": ` ` ` Patient sent for surgical consultation at the request of Dr Dalene Carrow GI  Chief Complaint: Rectal cancer. ` ` The patient is a pleasant active woman that was found to be Hemoccult positive on annual visit. Underwent colonoscopy and found to have a mass in the mid rectum. Biopsy consistent with adenocarcinoma. Surgical consultation requested. Patient comes today by herself. No family history of any colorectal issues. She usually moves her bowels every day. She did have some constipation around the holidays but not an issue now. No frank bleeding. She's had a tubal ligation as well as a hysterectomy in the distant past. She thinks she might have had an appendectomy as well. She walks twice a day for 30 minutes at a time. She does have some hypertension and "white coat syndrome". However no cardiovascular issues. She does not smoke. She is not diabetic. With the diagnosis, her brother is getting a colonoscopy as well. Her mother passed away from bone cancer in 2004. No other family history of cancer that she recalls.   No personal nor family history of GI/colon cancer, inflammatory bowel disease, irritable bowel syndrome, allergy such as Celiac Sprue, dietary/dairy problems, colitis, ulcers nor gastritis. No recent sick contacts/gastroenteritis. No travel outside the country. No changes in diet. No dysphagia to solids or liquids. No significant heartburn or reflux. No hematochezia, hematemesis, coffee ground emesis. No evidence of prior gastric/peptic ulceration.  (Review of systems as stated in this history (HPI) or in the  review of systems. Otherwise all other 12 point ROS are negative) ` ` `   Past Surgical History Geni Bers [place], RMA; 03/06/2019 8:44 AM) Appendectomy  Hysterectomy (not due to cancer) - Complete   Diagnostic Studies History [facility], RMA; 03/06/2019 8:44 AM) Mammogram  within last year  Allergies Geni Bers [place], RMA; 03/06/2019 8:45 AM) Toprol XL *BETA BLOCKERS*  Swollen lips. Allergies Reconciled   Medication History [employer], RMA; 03/06/2019 8:46 AM) amLODIPine Besylate (Oral) Specific strength unknown - Active. Losartan Potassium (Oral) Specific strength unknown - Active. Synthroid (125MCG Tablet, Oral) Active. Vitamin C (Oral) Specific strength unknown - Active. Vitamin D3 (Oral) Specific strength unknown - Active. Calcium (Oral) Specific strength unknown - Active. Zinc (Oral) Specific strength unknown - Active. Elderberry (Oral) Specific strength unknown - Active. Biotin (Oral) Specific strength unknown - Active. Medications Reconciled  Social History Geni Bers [place], RMA; 03/06/2019 8:44 AM) Alcohol use  Moderate alcohol use. Caffeine use  Carbonated beverages. No drug use  Tobacco use  Former smoker.  Family History Geni Bers [place], RMA; 03/06/2019 8:44 AM) Alcohol Abuse  Father. Cancer  Mother. Heart Disease  Father. Heart disease in female family member before age 98  Hypertension  Mother.  Pregnancy / Birth History Geni Bers [place], RMA; 03/06/2019 8:44 AM) Age at menarche  29 years. Age of menopause  56-55 Gravida  1 Maternal age  61-25 Para  55  Other Problems Geni Bers [place], RMA; 03/06/2019 8:44 AM) High blood pressure  Hypercholesterolemia  Lump In Breast  Oophorectomy  Bilateral. Thyroid Disease     Review of Systems Geni Bers [place] RMA; 03/06/2019 8:44 AM) General Not Present- Appetite Loss, Chills, Fatigue, Fever, Night Sweats, Weight Gain and Weight Loss.  Skin Not  Present- Change in Wart/Mole, Dryness, Hives, Jaundice, New Lesions, Non-Healing Wounds, Rash and Ulcer. HEENT Present- Wears glasses/contact lenses. Not Present- Earache, Hearing Loss, Hoarseness, Nose Bleed, Oral Ulcers, Ringing in the Ears, Seasonal Allergies, Sinus Pain, Sore Throat, Visual Disturbances and Yellow Eyes. Respiratory Not Present- Bloody sputum, Chronic Cough, Difficulty Breathing, Snoring and Wheezing. Breast Not Present- Breast Mass, Breast Pain, Nipple Discharge and Skin Changes. Cardiovascular Not Present- Chest Pain, Difficulty Breathing Lying Down, Leg Cramps, Palpitations, Rapid Heart Rate, Shortness of Breath and Swelling of Extremities. Gastrointestinal Not Present- Abdominal Pain, Bloating, Bloody Stool, Change in Bowel Habits, Chronic diarrhea, Constipation, Difficulty Swallowing, Excessive gas, Gets full quickly at meals, Hemorrhoids, Indigestion, Nausea, Rectal Pain and Vomiting. Female Genitourinary Not Present- Frequency, Nocturia, Painful Urination, Pelvic Pain and Urgency. Musculoskeletal Not Present- Back Pain, Joint Pain, Joint Stiffness, Muscle Pain, Muscle Weakness and Swelling of Extremities. Neurological Not Present- Decreased Memory, Fainting, Headaches, Numbness, Seizures, Tingling, Tremor, Trouble walking and Weakness. Endocrine Not Present- Cold Intolerance, Excessive Hunger, Hair Changes, Heat Intolerance, Hot flashes and New Diabetes. Hematology Not Present- Blood Thinners, Easy Bruising, Excessive bleeding, Gland problems, HIV and Persistent Infections.  Vitals Geni Bers [place] RMA; 03/06/2019 8:47 AM) 03/06/2019 8:46 AM Weight: 147.2 lb Height: 67in Body Surface Area: 1.78 m Body Mass Index: 23.05 kg/m  Temp.: 98.24F(Temporal)  Pulse: 106 (Regular)  P.OX: 98% (Room air) BP: 180/92 (Sitting, Right Arm, Standard)       Physical Exam Adin Hector MD; 03/06/2019 9:32 AM) General Mental Status-Alert. General Appearance-Not  in acute distress, Not Sickly. Orientation-Oriented X3. Hydration-Well hydrated. Voice-Normal. Note: Tearful but consolable. Moves around easily.   Integumentary Global Assessment Upon inspection and palpation of skin surfaces of the - Axillae: non-tender, no inflammation or ulceration, no drainage. and Distribution of scalp and body hair is normal. General Characteristics Temperature - normal warmth is noted.  Head and Neck Head-normocephalic, atraumatic with no lesions or palpable masses. Face Global Assessment - atraumatic, no absence of expression. Neck Global Assessment - no abnormal movements, no bruit auscultated on the right, no bruit auscultated on the left, no decreased range of motion, non-tender. Trachea-midline. Thyroid Gland Characteristics - non-tender.  Eye Eyeball - Left-Extraocular movements intact, No Nystagmus. Eyeball - Right-Extraocular movements intact, No Nystagmus. Cornea - Left-No Hazy. Cornea - Right-No Hazy. Sclera/Conjunctiva - Left-No scleral icterus, No Discharge. Sclera/Conjunctiva - Right-No scleral icterus, No Discharge. Pupil - Left-Direct reaction to light normal. Pupil - Right-Direct reaction to light normal.  ENMT Ears Pinna - Left - no drainage observed, no generalized tenderness observed. Right - no drainage observed, no generalized tenderness observed. Nose and Sinuses External Inspection of the Nose - no destructive lesion observed. Inspection of the nares - Left - quiet respiration. Right - quiet respiration. Mouth and Throat Lips - Upper Lip - no fissures observed, no pallor noted. Lower Lip - no fissures observed, no pallor noted. Nasopharynx - no discharge present. Oral Cavity/Oropharynx - Tongue - no dryness observed. Oral Mucosa - no cyanosis observed. Hypopharynx - no evidence of airway distress observed.  Chest and Lung Exam Inspection Movements - Normal and Symmetrical. Accessory muscles - No use  of accessory muscles in breathing. Palpation Palpation of the chest reveals - Non-tender. Auscultation Breath sounds - Normal and Clear.  Cardiovascular Auscultation Rhythm - Regular. Murmurs & Other Heart Sounds - Auscultation of the heart reveals - No Murmurs and No Systolic Clicks.  Abdomen Inspection Inspection of the abdomen reveals - No Visible peristalsis and No Abnormal  pulsations. Umbilicus - No Bleeding, No Urine drainage. Palpation/Percussion Palpation and Percussion of the abdomen reveal - Soft, Non Tender, No Rebound tenderness, No Rigidity (guarding) and No Cutaneous hyperesthesia. Note: Abdomen soft. Small periumbilical incision consistent with prior tubal ligation. Not distended. No distasis recti. No umbilical or other anterior abdominal wall hernias   Female Genitourinary Sexual Maturity Tanner 5 - Adult hair pattern. Note: No vaginal bleeding nor discharge   Rectal Note: Firm mass felt posterior midline around 8 cm from sphincters. Mildly sensitive but mobile. Involving about 20% of the circumference. Too high to be seen by anoscopy.  Perianal skin clear. Small anterior midline tag. Normal sphincter tone. Tolerated digital and anoscopic exam. No fissure. No abscess. No fistula. No pilonidal disease. No condyloma. Grade 1 internal hemorrhoids without bleeding or prolapse. Sphincter tone normal and intact. Soft stool in rectal vault.   Peripheral Vascular Upper Extremity Inspection - Left - No Cyanotic nailbeds, Not Ischemic. Right - No Cyanotic nailbeds, Not Ischemic.  Neurologic Neurologic evaluation reveals -normal attention span and ability to concentrate, able to name objects and repeat phrases. Appropriate fund of knowledge , normal sensation and normal coordination. Mental Status Affect - not angry, not paranoid. Cranial Nerves-Normal Bilaterally. Gait-Normal.  Neuropsychiatric Mental status exam performed with findings of-able to  articulate well with normal speech/language, rate, volume and coherence, thought content normal with ability to perform basic computations and apply abstract reasoning and no evidence of hallucinations, delusions, obsessions or homicidal/suicidal ideation. Note: Anxious but consolable   Musculoskeletal Global Assessment Spine, Ribs and Pelvis - no instability, subluxation or laxity. Right Upper Extremity - no instability, subluxation or laxity.  Lymphatic Head & Neck  General Head & Neck Lymphatics: Bilateral - Description - No Localized lymphadenopathy. Axillary  General Axillary Region: Bilateral - Description - No Localized lymphadenopathy. Femoral & Inguinal  Generalized Femoral & Inguinal Lymphatics: Left - Description - No Localized lymphadenopathy. Right - Description - No Localized lymphadenopathy.   Results Adin Hector MD; 03/06/2019 9:32 AM) Procedures  Name Value Date Hemorrhoids Procedure Anal exam: External Hemorrhoid Internal exam: Mass Other: Firm mass felt posterior midline around 8 cm from sphincters. Mildly sensitive but mobile. Involving about 20% of the circumference. Too high to be seen by anoscopy. ......  ...... Perianal skin clear. Small anterior midline tag. Normal sphincter tone. Tolerated digital and anoscopic exam. No fissure. No abscess. No fistula. No pilonidal disease. No condyloma. Grade 1 internal hemorrhoids without bleeding or prolapse. Sphincter tone normal and intact. Soft stool in rectal vault.  Performed: 03/06/2019 9:09 AM    Assessment & Plan Adin Hector MD; 03/06/2019 9:32 AM) RECTAL ADENOCARCINOMA (C20) Impression: Pleasant healthy woman with mid rectal cancer noted due to positive Hemoccult on screening colonoscopy. Not particularly broad but firm. Most likely at least T2, possibly T3.  I think she would benefit from treatment for this. With the cancer diagnosis, this cannot be delayed.  MRI of pelvis  for local staging. If T1 or T2, proceed with surgery first. If T3 or thicker, neoadjuvant chemotherapy radiation therapy per standard protocol. Regardless, at some point she would benefit from low anterior resection. Would plan robotic minimally invasive approach. Anastomosis could be more distal than 5 cm, making the need for diverting loop ileostomy possible. Plan marking testing case. She is quite active without any major bowel or sphincter issues, so I don't think she needs permanent colostomy.  She is very anxious but motivated to get this dealt with. Her brother is getting  a colonoscopy. Her son is over 34. I recommend that any first degree relative over 40 should consider colonoscopy, so therefore her son should proceed with colonoscopy as well. Current Plans You are being scheduled for surgery- Our schedulers will call you.  You should hear from our office's scheduling department within 5 working days about the location, date, and time of surgery. We try to make accommodations for patient's preferences in scheduling surgery, but sometimes the OR schedule or the surgeon's schedule prevents [place] from making those accommodations.  If you have not heard from our office [phone]) in 5 working days, call the office and ask for your surgeon's nurse.  If you have other questions about your diagnosis, plan, or surgery, call the office and ask for your surgeon's nurse.  Written instructions provided The anatomy & physiology of the digestive tract was discussed. The pathophysiology of the colon was discussed. Natural history risks without surgery was discussed. I feel the risks of no intervention will lead to serious problems that outweigh the operative risks; therefore, I recommended a partial colectomy to remove the pathology. Minimally invasive (Robotic/Laparoscopic) & open techniques were discussed.  Risks such as bleeding, infection, abscess, leak, reoperation, possible ostomy, hernia,  heart attack, death, and other risks were discussed. I noted a good likelihood this will help address the problem. Goals of post-operative recovery were discussed as well. Need for adequate nutrition, daily bowel regimen and healthy physical activity, to optimize recovery was noted as well. We will work to minimize complications. Educational materials were available as well. Questions were answered. The patient expresses understanding & wishes to proceed with surgery.  ANOSCOPY, DIAGNOSTIC (46600) PREOP COLON - ENCOUNTER FOR PREOPERATIVE EXAMINATION FOR GENERAL SURGICAL PROCEDURE (Z01.818) Current Plans Pt Education - [facility] Colon Bowel Prep 2018 ERAS/Miralax/Antibiotics Started Neomycin Sulfate 500 MG Oral Tablet, 2 (two) Tablet SEE NOTE, #6, 03/06/2019, No Refill. Local Order: TAKE TWO TABLETS AT 2 PM, 3 PM, AND 10 PM THE DAY PRIOR TO SURGERY Started Flagyl 500 MG Oral Tablet, 2 (two) Tablet SEE NOTE, #6, 03/06/2019, No Refill. Local Order: Take at 2pm, 3pm, and 10pm the day prior to your colon operation Pt Education - Pamphlet Given - Laparoscopic Colorectal Surgery: discussed with patient and provided information. Pt Education - [facility] Colorectal Cancer (AT): discussed with patient and provided information. Pt Education - [facility] Colectomy post-op instructions: discussed with patient and provided information.   03/19/2019-04/27/2019:  The patient received 45 Gy to  the rectum in 25 fractions, followed by a 5.4 Gy boost.    Addendum: Patient has undergone neoadjuvant chemoradiation therapy and finished 04/27/2019.  Plan surgery 10 weeks later in late July      Adin Hector, MD, FACS, MASCRS Gastrointestinal and Minimally Invasive Surgery    [address], [place], [place] [address] [phone] Main / Paging [phone] Fax

## 2019-05-23 NOTE — Telephone Encounter (Signed)
  Radiation Oncology         [phone]) [phone] ________________________________  Name: Kathleen Flores MRN: [id number]  Date of Service: 05/23/2019  DOB: 05-24-1946  Post Treatment Telephone Note  Diagnosis:   Stage IIIB, [id number] adenocarcinoma of the rectum.  Interval Since Last Radiation:  4 weeks   03/19/2019-04/27/2019:  The patient received 45 Gy to  the rectum in 25 fractions, followed by a 5.4 Gy boost.   Narrative:  The patient was contacted today for routine follow-up. During treatment she did very well with radiotherapy though she did use anusol suppositories for frequency, she does have some epigastric discomfort after certain foods.   Impression/Plan: 1. Stage IIIB, [id number] adenocarcinoma of the rectum. The patient has been doing well since completion of radiotherapy. We discussed that we would be happy to continue to follow her as needed, but she will also continue to follow up with Dr. Benay Spice in medical oncology. She is scheduled with Dr. Johney Maine for surgery on 07/12/2019 and is ready to proceed.  2. Probable GERD. We discussed her symptoms and I suggested she consider a PPI in addition to her Pepcid. We will follow this expectantly.     Carola Rhine, PAC

## 2019-06-04 ENCOUNTER — Other Ambulatory Visit: Payer: Self-pay | Admitting: Nurse Practitioner

## 2019-06-04 ENCOUNTER — Telehealth: Payer: Self-pay | Admitting: *Deleted

## 2019-06-04 DIAGNOSIS — C20 Malignant neoplasm of rectum: Secondary | ICD-10-CM

## 2019-06-04 MED ORDER — PANTOPRAZOLE SODIUM 40 MG PO TBEC: 40.0000 mg | DELAYED_RELEASE_TABLET | Freq: Every day | ORAL | 1 refills | Status: DC

## 2019-06-04 NOTE — Telephone Encounter (Addendum)
Called to report it has been 6 weeks since her RT/chemo finished. Surgery scheduled for 07/12/2019. Beginning 3-4 weeks ago developed epigastric pain--pain is a burning, knawing pain that has progressed. She has been taking Pepcid in am and Prolosec in the evening without relief. Per Dr. Benay Spice: D/C Pepcid and Prilosec. Start Protonix 40 mg daily. Script has been called in. If not better in 7-10 days, call Dr. Ardis Hughs. She understands and agrees.

## 2019-06-07 ENCOUNTER — Telehealth: Payer: Self-pay | Admitting: Pharmacist

## 2019-06-07 NOTE — Telephone Encounter (Signed)
Oral Oncology Pharmacist Encounter  Received call from patient with questions about interactions with pantoprazole and simethicone. Patient endorses continued issues with indigestion and gas. Dr. Benay Spice discontinued omeprazole and famotine use on 6/22 and prescribed pantoprazole. Today is day 2 of pantoprazole administration. She states sym,ptoms were better yesterday but then worsened today. Patient informed she is able to take simethicone safely. It will not decrease gas production but will allow for gas expulsion more easily. Patient reminded of previous recommendation from Dr. Benay Spice on 06/04/19 that if symtpkms are not improved in 7-10 days that she should contact Dr. Ardis Hughs. Patient expressed understanding and is in agreement with plan. She will contact the office with additional questions or concerns. She plans to contact Dr. Ardis Hughs on 06/12/19 if symptoms are not improved.  I provided patient with direct dial to oral oncology clinic for any additional medication questions that might arise.  Johny Drilling, PharmD, BCPS, BCOP  06/07/2019 4:27 PM Oral Oncology Clinic [phone]

## 2019-06-11 ENCOUNTER — Telehealth: Payer: Self-pay | Admitting: Gastroenterology

## 2019-06-11 NOTE — Telephone Encounter (Signed)
Pt states that she has been experiencing adb pain, her oncologist told her that she needs to see Dr. Ardis Hughs asap. His first available is not until 7/31. She is requesting to speak with Dr. Ardis Hughs' nurse. Pls call her

## 2019-06-11 NOTE — Telephone Encounter (Signed)
Can I see her at 1pm (in office) this Wednesday July 1st?

## 2019-06-11 NOTE — Telephone Encounter (Signed)
appt made for 7/1 at 1 pm pt aware

## 2019-06-11 NOTE — Telephone Encounter (Signed)
Dr Ardis Hughs see oncology note below;  "Called to report it has been 6 weeks since her RT/chemo finished. Surgery scheduled for 07/12/2019. Beginning 3-4 weeks ago developed epigastric pain--pain is a burning, knawing pain that has progressed. She has been taking Pepcid in am and Prolosec in the evening without relief. Per Dr. Benay Spice: D/C Pepcid and Prilosec. Start Protonix 40 mg daily. Script has been called in. If not better in 7-10 days, call Dr. Ardis Hughs. She understands and agrees."   Your next available is 7/31 and there at no appts with Nevin Bloodgood.  Please advise

## 2019-06-12 ENCOUNTER — Telehealth: Payer: Self-pay

## 2019-06-12 NOTE — Telephone Encounter (Signed)
Covid-19 screening questions   Do you now or have you had a fever in the last 14 days no   Do you have any respiratory symptoms of shortness of breath or cough now or in the last 14 days no  Do you have any family members or close contacts with diagnosed or suspected Covid-19 in the past 14 days no  Have you been tested for Covid-19 and found to be positive no          

## 2019-06-13 ENCOUNTER — Encounter: Payer: Self-pay | Admitting: Gastroenterology

## 2019-06-13 ENCOUNTER — Ambulatory Visit (INDEPENDENT_AMBULATORY_CARE_PROVIDER_SITE_OTHER): Payer: PPO | Admitting: Gastroenterology

## 2019-06-13 VITALS — BP 150/60 | HR 112 | Ht 67.0 in | Wt 142.0 lb

## 2019-06-13 DIAGNOSIS — R1013 Epigastric pain: Secondary | ICD-10-CM | POA: Diagnosis not present

## 2019-06-13 NOTE — Progress Notes (Signed)
Review of pertinent gastrointestinal problems: 1.  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  MRI showed T3BN1 lesion.  Neoadjuvant therapy completed May 2020.   HPI: This is a very pleasant 73 year old woman whom I last saw the time of a colonoscopy about 4 months ago.  She is here today for a new problem  Chief complaint is epigastric pain  Around the time that she finished her chemotherapy and radiation she started having intermittent epigastric pains.  They are associated with some bloating and nausea.  Pains come on generally in the afternoon.  Not necessarily related to eating.  They occur almost daily.  They seem to wax and wane, almost colicky in nature.  She has not had any vomiting.  She does not take NSAIDs.  She has been having basically liquid stools since neoadjuvant therapy.  No fevers or chills.  She started Protonix once daily 8 days ago and thinks her symptoms have somewhat improved but certainly not completely.  Staging CT scan in March showed normal gallbladder, normal stomach  Old Data Reviewed: CBC and complete metabolic profile May 4782 were essentially normal   Review of systems: Pertinent positive and negative review of systems were noted in the above HPI section. All other review negative.   Past Medical History:  Diagnosis Date  . Anxiety    situational anxiety  . Benign essential HTN   . Hyperglycemia   . Hyperlipemia   . Hypothyroidism   . Vitamin D deficiency   . White coat syndrome with diagnosis of hypertension     Past Surgical History:  Procedure Laterality Date  . ABDOMINAL HYSTERECTOMY     age 35  . APPENDECTOMY    . BREAST EXCISIONAL BIOPSY Bilateral    benign  . ECTOPIC PREGNANCY SURGERY    . TUBAL LIGATION      Current Outpatient Medications  Medication Sig Dispense Refill  . ALPRAZolam (XANAX) 0.25 MG tablet Take 1 tablet (0.25 mg total) by mouth 2 (two) times  daily as needed for anxiety. 60 tablet 0  . Alum & Mag Hydroxide-Simeth (MYLANTA PO) Take 30 mLs by mouth as needed.    Marland Kitchen amLODipine (NORVASC) 5 MG tablet Take 5 mg by mouth daily.    . Ascorbic Acid (VITAMIN C) 1000 MG tablet Take 1,000 mg by mouth daily.     Marland Kitchen BIOTIN PO Take 12,000 mcg by mouth daily.     . cholecalciferol (VITAMIN D3) 25 MCG (1000 UT) tablet Take 1,000 Units by mouth daily.    . Cyanocobalamin (VITAMIN B-12 PO) Take 5,000 mcg by mouth daily.    Marland Kitchen levothyroxine (SYNTHROID, LEVOTHROID) 125 MCG tablet Take 125 mcg by mouth daily before breakfast.    . loperamide (IMODIUM) 2 MG capsule Take 2-4 mg by mouth as needed for diarrhea or loose stools.    Marland Kitchen losartan (COZAAR) 100 MG tablet Take 100 mg by mouth daily.    . pantoprazole (PROTONIX) 40 MG tablet Take 1 tablet (40 mg total) by mouth daily. 30 tablet 1   No current facility-administered medications for this visit.     Allergies as of 06/13/2019 - Review Complete 06/13/2019  Allergen Reaction Noted  . Metoprolol Swelling 01/26/2019  . Statins  01/26/2019  . Demerol [meperidine hcl] Nausea And Vomiting 02/28/2019    Family History  Problem Relation Age of Onset  . Lung cancer Mother   . Diabetes Father   . Heart  disease Father   . Colon cancer Neg Hx   . Esophageal cancer Neg Hx   . Stomach cancer Neg Hx   . Pancreatic cancer Neg Hx     Social History   Socioeconomic History  . Marital status: Married    Spouse name: Not on file  . Number of children: 1  . Years of education: Not on file  . Highest education level: Not on file  Occupational History  . Occupation: Retired Animal nutritionist  . Financial resource strain: Not on file  . Food insecurity    Worry: Not on file    Inability: Not on file  . Transportation needs    Medical: No    Non-medical: No  Tobacco Use  . Smoking status: Former Smoker    Packs/day: 0.50    Years: 8.00    Pack years: 4.00  . Smokeless tobacco: Never Used  . Tobacco  comment: Pt quit 40 years ago  Substance and Sexual Activity  . Alcohol use: Yes    Alcohol/week: 7.0 standard drinks    Types: 7 Glasses of wine per week  . Drug use: Never  . Sexual activity: Not on file  Lifestyle  . Physical activity    Days per week: Not on file    Minutes per session: Not on file  . Stress: Not on file  Relationships  . Social Herbalist on phone: Not on file    Gets together: Not on file    Attends religious service: Not on file    Active member of club or organization: Not on file    Attends meetings of clubs or organizations: Not on file    Relationship status: Not on file  . Intimate partner violence    Fear of current or ex partner: Not on file    Emotionally abused: Not on file    Physically abused: Not on file    Forced sexual activity: Not on file  Other Topics Concern  . Not on file  Social History Narrative  . Not on file     Physical Exam: BP (!) 150/60   Pulse (!) 112   Ht 5\' 7"  (1.702 m)   Wt 142 lb (64.4 kg)   BMI 22.24 kg/m  Constitutional: generally well-appearing Psychiatric: alert and oriented x3 Eyes: extraocular movements intact Mouth: oral pharynx moist, no lesions Neck: supple no lymphadenopathy Cardiovascular: heart regular rate and rhythm Lungs: clear to auscultation bilaterally Abdomen: soft, nontender, nondistended, no obvious ascites, no peritoneal signs, normal bowel sounds Extremities: no lower extremity edema bilaterally Skin: no lesions on visible extremities   Assessment and plan: 73 y.o. female with intermittent epigastric pains  First I think these are probably not related to her cancer or her neoadjuvant treatment for the rectal cancer.  Seems either gastric or biliary.  CT scan is not a very sensitive test to see gallstones and so I am ordering an ultrasound for her to check to see if she has gallstone disease.  I am also planning upper endoscopy to check for H. pylori, peptic ulcer disease,  significant acid related damage.    Please see the "Patient Instructions" section for addition details about the plan.   Owens Loffler, MD [facility] 06/13/2019, 12:55 PM  Cc: [name], [place]

## 2019-06-13 NOTE — Patient Instructions (Addendum)
Right upper quadrant ultrasound check for gallstones  EGD next week while I am covering inpatient service at the hospitals.   Both of these are for epigastric abdominal pain, intermittent.  She will continue on Protonix once daily for now.  You have been scheduled for an abdominal ultrasound at [facility] (1st floor of hospital) on 06/20/19 at 930am. Please arrive 15 minutes prior to your appointment for registration. Make certain not to have anything to eat or drink 6 hours prior to your appointment. Should you need to reschedule your appointment, please contact radiology at [phone]. This test typically takes about 30 minutes to perform.  Thank you for entrusting me with your care and choosing [facility].  Dr Ardis Hughs

## 2019-06-13 NOTE — H&P (View-Only) (Signed)
Review of pertinent gastrointestinal problems: 1.  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  MRI showed T3BN1 lesion.  Neoadjuvant therapy completed May 2020.   HPI: This is a very pleasant 73 year old woman whom I last saw the time of a colonoscopy about 4 months ago.  She is here today for a new problem  Chief complaint is epigastric pain  Around the time that she finished her chemotherapy and radiation she started having intermittent epigastric pains.  They are associated with some bloating and nausea.  Pains come on generally in the afternoon.  Not necessarily related to eating.  They occur almost daily.  They seem to wax and wane, almost colicky in nature.  She has not had any vomiting.  She does not take NSAIDs.  She has been having basically liquid stools since neoadjuvant therapy.  No fevers or chills.  She started Protonix once daily 8 days ago and thinks her symptoms have somewhat improved but certainly not completely.  Staging CT scan in March showed normal gallbladder, normal stomach  Old Data Reviewed: CBC and complete metabolic profile May 4270 were essentially normal   Review of systems: Pertinent positive and negative review of systems were noted in the above HPI section. All other review negative.   Past Medical History:  Diagnosis Date  . Anxiety    situational anxiety  . Benign essential HTN   . Hyperglycemia   . Hyperlipemia   . Hypothyroidism   . Vitamin D deficiency   . White coat syndrome with diagnosis of hypertension     Past Surgical History:  Procedure Laterality Date  . ABDOMINAL HYSTERECTOMY     age 36  . APPENDECTOMY    . BREAST EXCISIONAL BIOPSY Bilateral    benign  . ECTOPIC PREGNANCY SURGERY    . TUBAL LIGATION      Current Outpatient Medications  Medication Sig Dispense Refill  . ALPRAZolam (XANAX) 0.25 MG tablet Take 1 tablet (0.25 mg total) by mouth 2 (two) times  daily as needed for anxiety. 60 tablet 0  . Alum & Mag Hydroxide-Simeth (MYLANTA PO) Take 30 mLs by mouth as needed.    Marland Kitchen amLODipine (NORVASC) 5 MG tablet Take 5 mg by mouth daily.    . Ascorbic Acid (VITAMIN C) 1000 MG tablet Take 1,000 mg by mouth daily.     Marland Kitchen BIOTIN PO Take 12,000 mcg by mouth daily.     . cholecalciferol (VITAMIN D3) 25 MCG (1000 UT) tablet Take 1,000 Units by mouth daily.    . Cyanocobalamin (VITAMIN B-12 PO) Take 5,000 mcg by mouth daily.    Marland Kitchen levothyroxine (SYNTHROID, LEVOTHROID) 125 MCG tablet Take 125 mcg by mouth daily before breakfast.    . loperamide (IMODIUM) 2 MG capsule Take 2-4 mg by mouth as needed for diarrhea or loose stools.    Marland Kitchen losartan (COZAAR) 100 MG tablet Take 100 mg by mouth daily.    . pantoprazole (PROTONIX) 40 MG tablet Take 1 tablet (40 mg total) by mouth daily. 30 tablet 1   No current facility-administered medications for this visit.     Allergies as of 06/13/2019 - Review Complete 06/13/2019  Allergen Reaction Noted  . Metoprolol Swelling 01/26/2019  . Statins  01/26/2019  . Demerol [meperidine hcl] Nausea And Vomiting 02/28/2019    Family History  Problem Relation Age of Onset  . Lung cancer Mother   . Diabetes Father   . Heart  disease Father   . Colon cancer Neg Hx   . Esophageal cancer Neg Hx   . Stomach cancer Neg Hx   . Pancreatic cancer Neg Hx     Social History   Socioeconomic History  . Marital status: Married    Spouse name: Not on file  . Number of children: 1  . Years of education: Not on file  . Highest education level: Not on file  Occupational History  . Occupation: Retired Animal nutritionist  . Financial resource strain: Not on file  . Food insecurity    Worry: Not on file    Inability: Not on file  . Transportation needs    Medical: No    Non-medical: No  Tobacco Use  . Smoking status: Former Smoker    Packs/day: 0.50    Years: 8.00    Pack years: 4.00  . Smokeless tobacco: Never Used  . Tobacco  comment: Pt quit 40 years ago  Substance and Sexual Activity  . Alcohol use: Yes    Alcohol/week: 7.0 standard drinks    Types: 7 Glasses of wine per week  . Drug use: Never  . Sexual activity: Not on file  Lifestyle  . Physical activity    Days per week: Not on file    Minutes per session: Not on file  . Stress: Not on file  Relationships  . Social Herbalist on phone: Not on file    Gets together: Not on file    Attends religious service: Not on file    Active member of club or organization: Not on file    Attends meetings of clubs or organizations: Not on file    Relationship status: Not on file  . Intimate partner violence    Fear of current or ex partner: Not on file    Emotionally abused: Not on file    Physically abused: Not on file    Forced sexual activity: Not on file  Other Topics Concern  . Not on file  Social History Narrative  . Not on file     Physical Exam: BP (!) 150/60   Pulse (!) 112   Ht 5\' 7"  (1.702 m)   Wt 142 lb (64.4 kg)   BMI 22.24 kg/m  Constitutional: generally well-appearing Psychiatric: alert and oriented x3 Eyes: extraocular movements intact Mouth: oral pharynx moist, no lesions Neck: supple no lymphadenopathy Cardiovascular: heart regular rate and rhythm Lungs: clear to auscultation bilaterally Abdomen: soft, nontender, nondistended, no obvious ascites, no peritoneal signs, normal bowel sounds Extremities: no lower extremity edema bilaterally Skin: no lesions on visible extremities   Assessment and plan: 73 y.o. female with intermittent epigastric pains  First I think these are probably not related to her cancer or her neoadjuvant treatment for the rectal cancer.  Seems either gastric or biliary.  CT scan is not a very sensitive test to see gallstones and so I am ordering an ultrasound for her to check to see if she has gallstone disease.  I am also planning upper endoscopy to check for H. pylori, peptic ulcer disease,  significant acid related damage.    Please see the "Patient Instructions" section for addition details about the plan.   Owens Loffler, MD [facility] 06/13/2019, 12:55 PM  Cc: [name], [place]

## 2019-06-14 ENCOUNTER — Telehealth: Payer: Self-pay | Admitting: Gastroenterology

## 2019-06-14 ENCOUNTER — Other Ambulatory Visit: Payer: Self-pay | Admitting: Gastroenterology

## 2019-06-14 MED ORDER — PANTOPRAZOLE SODIUM 40 MG PO TBEC: 40.0000 mg | DELAYED_RELEASE_TABLET | Freq: Two times a day (BID) | ORAL | 3 refills | Status: DC

## 2019-06-14 NOTE — Telephone Encounter (Signed)
Patient was seen yesterday and said that she had stomach spasm and vomited 1x last night and would like to know if she can have something until her procedure on 7-7

## 2019-06-14 NOTE — Telephone Encounter (Signed)
Dr Ardis Hughs, I spoke to patient who states last night around 6 pm she started having abdominal pains,she ate a few bites of her supper then the pains became worse lasting several hours. She vomited once,no fever. Is there anything she can have or do to relieve her symptoms before her procedure next week?

## 2019-06-14 NOTE — Telephone Encounter (Signed)
Can you double her protonix to 40mg  po twice daily, disp 60 with 3 refills.  Thanks

## 2019-06-14 NOTE — Telephone Encounter (Signed)
Patient has been notified of Dr Ardis Hughs recommendations to increase Protonix 40 mg twice daily. Sent to pharmacy.

## 2019-06-15 ENCOUNTER — Other Ambulatory Visit (HOSPITAL_COMMUNITY)
Admission: RE | Admit: 2019-06-15 | Discharge: 2019-06-15 | Disposition: A | Discharge status: Home / Self Care | Payer: PPO | Source: Ambulatory Visit | Attending: Gastroenterology | Admitting: Gastroenterology

## 2019-06-15 DIAGNOSIS — Z01812 Encounter for preprocedural laboratory examination: Secondary | ICD-10-CM | POA: Insufficient documentation

## 2019-06-15 DIAGNOSIS — Z1159 Encounter for screening for other viral diseases: Secondary | ICD-10-CM | POA: Insufficient documentation

## 2019-06-15 LAB — SARS CORONAVIRUS 2 (TAT 6-24 HRS): SARS Coronavirus 2: NEGATIVE

## 2019-06-18 ENCOUNTER — Encounter (HOSPITAL_COMMUNITY): Payer: Self-pay | Admitting: *Deleted

## 2019-06-18 ENCOUNTER — Other Ambulatory Visit: Payer: Self-pay

## 2019-06-18 NOTE — Progress Notes (Signed)
Spoke with patient who confirms that she has remained quarantined and is not showing any s/s of COVID 19 since her COVID test. All questions regarding her procedure answered. Jobe Igo, RN

## 2019-06-19 ENCOUNTER — Other Ambulatory Visit (HOSPITAL_COMMUNITY): Payer: PPO

## 2019-06-19 ENCOUNTER — Encounter (HOSPITAL_COMMUNITY)
Admission: RE | Disposition: A | Discharge status: Home / Self Care | Payer: Self-pay | Source: Home / Self Care | Attending: Gastroenterology

## 2019-06-19 ENCOUNTER — Ambulatory Visit (HOSPITAL_COMMUNITY): Payer: PPO | Admitting: Anesthesiology

## 2019-06-19 ENCOUNTER — Encounter (HOSPITAL_COMMUNITY): Payer: Self-pay | Admitting: *Deleted

## 2019-06-19 ENCOUNTER — Ambulatory Visit (HOSPITAL_COMMUNITY)
Admission: RE | Admit: 2019-06-19 | Discharge: 2019-06-19 | Disposition: A | Discharge status: Home / Self Care | Payer: PPO | Attending: Gastroenterology | Admitting: Gastroenterology

## 2019-06-19 DIAGNOSIS — R1013 Epigastric pain: Secondary | ICD-10-CM

## 2019-06-19 DIAGNOSIS — Z87891 Personal history of nicotine dependence: Secondary | ICD-10-CM | POA: Insufficient documentation

## 2019-06-19 DIAGNOSIS — Z85048 Personal history of other malignant neoplasm of rectum, rectosigmoid junction, and anus: Secondary | ICD-10-CM | POA: Insufficient documentation

## 2019-06-19 DIAGNOSIS — E039 Hypothyroidism, unspecified: Secondary | ICD-10-CM | POA: Diagnosis not present

## 2019-06-19 DIAGNOSIS — Z79899 Other long term (current) drug therapy: Secondary | ICD-10-CM | POA: Insufficient documentation

## 2019-06-19 DIAGNOSIS — Z9221 Personal history of antineoplastic chemotherapy: Secondary | ICD-10-CM | POA: Insufficient documentation

## 2019-06-19 DIAGNOSIS — Z79890 Hormone replacement therapy (postmenopausal): Secondary | ICD-10-CM | POA: Insufficient documentation

## 2019-06-19 DIAGNOSIS — Z923 Personal history of irradiation: Secondary | ICD-10-CM | POA: Insufficient documentation

## 2019-06-19 DIAGNOSIS — I10 Essential (primary) hypertension: Secondary | ICD-10-CM | POA: Insufficient documentation

## 2019-06-19 DIAGNOSIS — F419 Anxiety disorder, unspecified: Secondary | ICD-10-CM | POA: Diagnosis not present

## 2019-06-19 HISTORY — PX: ESOPHAGOGASTRODUODENOSCOPY (EGD) WITH PROPOFOL: SHX5813

## 2019-06-19 SURGERY — ESOPHAGOGASTRODUODENOSCOPY (EGD) WITH PROPOFOL
Anesthesia: Monitor Anesthesia Care

## 2019-06-19 MED ORDER — PROPOFOL 10 MG/ML IV BOLUS
INTRAVENOUS | Status: DC | PRN
  Administered 2019-06-19 (×2): 11 mg via INTRAVENOUS
  Administered 2019-06-19: 20 mg via INTRAVENOUS

## 2019-06-19 MED ORDER — DEXMEDETOMIDINE HCL 200 MCG/2ML IV SOLN
INTRAVENOUS | Status: DC | PRN
  Administered 2019-06-19: 20 ug via INTRAVENOUS

## 2019-06-19 MED ORDER — LACTATED RINGERS IV SOLN
INTRAVENOUS | Status: DC | PRN
  Administered 2019-06-19: 08:00:00 via INTRAVENOUS

## 2019-06-19 MED ORDER — PROPOFOL 500 MG/50ML IV EMUL
INTRAVENOUS | Status: DC | PRN
  Administered 2019-06-19: 75 ug/kg/min via INTRAVENOUS

## 2019-06-19 MED ORDER — HYOSCYAMINE SULFATE 0.125 MG SL SUBL: 0.2500 mg | SUBLINGUAL_TABLET | SUBLINGUAL | 3 refills | Status: DC | PRN

## 2019-06-19 MED ORDER — LIDOCAINE HCL (CARDIAC) PF 100 MG/5ML IV SOSY
PREFILLED_SYRINGE | INTRAVENOUS | Status: DC | PRN
  Administered 2019-06-19: 40 mg via INTRAVENOUS

## 2019-06-19 SURGICAL SUPPLY — 15 items

## 2019-06-19 NOTE — Transfer of Care (Signed)
Immediate Anesthesia Transfer of Care Note  Patient: Kathleen Flores  Procedure(s) Performed: ESOPHAGOGASTRODUODENOSCOPY (EGD) WITH PROPOFOL (N/A )  Patient Location: Endoscopy Unit  Anesthesia Type:MAC  Level of Consciousness: awake, alert  and oriented  Airway & Oxygen Therapy: Patient Spontanous Breathing and Patient connected to nasal cannula oxygen  Post-op Assessment: Report given to RN, Post -op Vital signs reviewed and stable and Patient moving all extremities X 4  Post vital signs: Reviewed and stable  Last Vitals:  Vitals Value Taken Time  BP 97/48 06/19/19 0811  Temp    Pulse 79 06/19/19 0812  Resp 20 06/19/19 0812  SpO2 99 % 06/19/19 0812  Vitals shown include unvalidated device data.  Last Pain:  Vitals:   06/19/19 0748  TempSrc:   PainSc: 10-Worst pain ever         Complications: No apparent anesthesia complications

## 2019-06-19 NOTE — Op Note (Signed)
[facility] Patient Name: Kathleen Flores Procedure Date : 06/19/2019 MRN: [id number] Attending MD: Milus Banister , MD Date of Birth: March 02, 1946 CSN: [id number] Age: 73 Admit Type: Outpatient Procedure:                Upper GI endoscopy Indications:              Epigastric abdominal pain Providers:                Milus Banister, MD, Carlyn Reichert, RN, Cherylynn Ridges, Technician, Virgilio Belling. Beckner, CRNA Referring MD:              Medicines:                Monitored Anesthesia Care Complications:            No immediate complications. Estimated blood loss:                            None. Estimated Blood Loss:     Estimated blood loss: none. Procedure:                Pre-Anesthesia Assessment:                           - Prior to the procedure, a History and Physical                            was performed, and patient medications and                            allergies were reviewed. The patient's tolerance of                            previous anesthesia was also reviewed. The risks                            and benefits of the procedure and the sedation                            options and risks were discussed with the patient.                            All questions were answered, and informed consent                            was obtained. Prior Anticoagulants: The patient has                            taken no previous anticoagulant or antiplatelet                            agents. ASA Grade Assessment: III - A patient with  severe systemic disease. After reviewing the risks                            and benefits, the patient was deemed in                            satisfactory condition to undergo the procedure.                           After obtaining informed consent, the endoscope was                            passed under direct vision. Throughout the                            procedure, the  patient's blood pressure, pulse, and                            oxygen saturations were monitored continuously. The                            GIF-H190 ([id number]) Olympus gastroscope was                            introduced through the mouth, and advanced to the                            second part of duodenum. The upper GI endoscopy was                            accomplished without difficulty. The patient                            tolerated the procedure well. Scope In: Scope Out: Findings:      The esophagus was normal.      The stomach was normal.      The examined duodenum was normal. Impression:               - Normal UGI tract.                           - I am more convinced that this is actually related                            to your rectal cancer, very possibly spasm/swelling                            related to radiation treatments. Recommendation:           - Discharge patient to home (ambulatory).                           - Resume previous diet.                           -  Trial of sublingual antispasm meds (1-2 tabs                            every 3-4 hours as needed for pains).                           - Also please try to keep your stools soft with                            daily miralax powder.                           - No need for [place] that is currently scheduled for                            tomorrow. Dr. Ardis Hughs' office will cancel it. Procedure Code(s):        --- Professional ---                           [phone], Esophagogastroduodenoscopy, flexible,                            transoral; diagnostic, including collection of                            specimen(s) by brushing or washing, when performed                            (separate procedure) Diagnosis Code(s):        --- Professional ---                           R10.13, Epigastric pain CPT copyright 2019 American Medical Association. All rights reserved. The codes documented in this report are  preliminary and upon coder review may  be revised to meet current compliance requirements. Milus Banister, MD 06/19/2019 8:20:03 AM This report has been signed electronically. Number of Addenda: 0

## 2019-06-19 NOTE — Interval H&P Note (Signed)
History and Physical Interval Note:  06/19/2019 7:40 AM  Kathleen Flores  has presented today for surgery, with the diagnosis of epi gastric pain.  The various methods of treatment have been discussed with the patient and family. After consideration of risks, benefits and other options for treatment, the patient has consented to  Procedure(s): ESOPHAGOGASTRODUODENOSCOPY (EGD) WITH PROPOFOL (N/A) as a surgical intervention.  The patient's history has been reviewed, patient examined, no change in status, stable for surgery.  I have reviewed the patient's chart and labs.  Questions were answered to the patient's satisfaction.     Milus Banister

## 2019-06-19 NOTE — Discharge Instructions (Signed)
YOU HAD AN ENDOSCOPIC PROCEDURE TODAY: Refer to the procedure report and other information in the discharge instructions given to you for any specific questions about what was found during the examination. If this information does not answer your questions, please call [place] office at [phone] to clarify.   YOU SHOULD EXPECT: Some feelings of bloating in the abdomen. Passage of more gas than usual. Walking can help get rid of the air that was put into your GI tract during the procedure and reduce the bloating. If you had a lower endoscopy (such as a colonoscopy or flexible sigmoidoscopy) you may notice spotting of blood in your stool or on the toilet paper. Some abdominal soreness may be present for a day or two, also.  DIET: Your first meal following the procedure should be a light meal and then it is ok to progress to your normal diet. A half-sandwich or bowl of soup is an example of a good first meal. Heavy or fried foods are harder to digest and may make you feel nauseous or bloated. Drink plenty of fluids but you should avoid alcoholic beverages for 24 hours. If you had a esophageal dilation, please see attached instructions for diet.    ACTIVITY: Your care partner should take you home directly after the procedure. You should plan to take it easy, moving slowly for the rest of the day. You can resume normal activity the day after the procedure however YOU SHOULD NOT DRIVE, use power tools, machinery or perform tasks that involve climbing or major physical exertion for 24 hours (because of the sedation medicines used during the test).   SYMPTOMS TO REPORT IMMEDIATELY: A gastroenterologist can be reached at any hour. Please call [phone]  for any of the following symptoms:   Following upper endoscopy (EGD, EUS, ERCP, esophageal dilation) Vomiting of blood or coffee ground material  New, significant abdominal pain  New, significant chest pain or pain under the shoulder blades  Painful or  persistently difficult swallowing  New shortness of breath  Black, tarry-looking or red, bloody stools  FOLLOW UP:  If any biopsies were taken you will be contacted by phone or by letter within the next 1-3 weeks. Call [phone]  if you have not heard about the biopsies in 3 weeks.  Please also call with any specific questions about appointments or follow up tests.  

## 2019-06-19 NOTE — Anesthesia Preprocedure Evaluation (Addendum)
Anesthesia Evaluation  Patient identified by MRN, date of birth, ID band Patient awake    Reviewed: Allergy & Precautions, NPO status , Patient's Chart, lab work & pertinent test results  Airway Mallampati: II  TM Distance: >3 FB Neck ROM: Full    Dental no notable dental hx.    Pulmonary former smoker,    Pulmonary exam normal breath sounds clear to auscultation       Cardiovascular hypertension, Pt. on medications Normal cardiovascular exam Rhythm:Regular Rate:Normal  ECG: ST, rate 106   Neuro/Psych Anxiety negative psych ROS   GI/Hepatic Neg liver ROS, GERD  Medicated and Controlled,colorectal has had radiation, and chemotherapy   Endo/Other  Hypothyroidism   Renal/GU negative Renal ROS     Musculoskeletal negative musculoskeletal ROS (+)   Abdominal   Peds  Hematology HLD   Anesthesia Other Findings epigastric pain  Reproductive/Obstetrics                            Anesthesia Physical Anesthesia Plan  ASA: III  Anesthesia Plan: MAC   Post-op Pain Management:    Induction: Intravenous  PONV Risk Score and Plan: 2 and Propofol infusion and Treatment may vary due to age or medical condition  Airway Management Planned: Natural Airway  Additional Equipment:   Intra-op Plan:   Post-operative Plan:   Informed Consent: I have reviewed the patients History and Physical, chart, labs and discussed the procedure including the risks, benefits and alternatives for the proposed anesthesia with the patient or authorized representative who has indicated his/her understanding and acceptance.     Dental advisory given  Plan Discussed with: CRNA  Anesthesia Plan Comments:         Anesthesia Quick Evaluation

## 2019-06-19 NOTE — Anesthesia Procedure Notes (Signed)
Procedure Name: MAC Date/Time: 06/19/2019 7:56 AM Performed by: Mariea Clonts, CRNA Pre-anesthesia Checklist: Patient identified, Emergency Drugs available, Suction available, Patient being monitored and Timeout performed Patient Re-evaluated:Patient Re-evaluated prior to induction Oxygen Delivery Method: Nasal cannula

## 2019-06-19 NOTE — Anesthesia Postprocedure Evaluation (Signed)
Anesthesia Post Note  Patient: Kathleen Flores  Procedure(s) Performed: ESOPHAGOGASTRODUODENOSCOPY (EGD) WITH PROPOFOL (N/A )     Patient location during evaluation: PACU Anesthesia Type: MAC Level of consciousness: awake and alert Pain management: pain level controlled Vital Signs Assessment: post-procedure vital signs reviewed and stable Respiratory status: spontaneous breathing, nonlabored ventilation, respiratory function stable and patient connected to nasal cannula oxygen Cardiovascular status: stable and blood pressure returned to baseline Postop Assessment: no apparent nausea or vomiting Anesthetic complications: no    Last Vitals:  Vitals:   06/19/19 0820 06/19/19 0830  BP: (!) 101/57 132/63  Pulse: 83 84  Resp: 20 15  Temp:    SpO2: 96% 98%    Last Pain:  Vitals:   06/19/19 0830  TempSrc:   PainSc: 0-No pain                 Zhyon Antenucci P Trameka Dorough

## 2019-06-20 ENCOUNTER — Encounter (HOSPITAL_COMMUNITY): Payer: Self-pay | Admitting: Gastroenterology

## 2019-06-20 ENCOUNTER — Other Ambulatory Visit: Payer: Self-pay

## 2019-06-20 ENCOUNTER — Ambulatory Visit (HOSPITAL_COMMUNITY): Payer: PPO

## 2019-06-20 ENCOUNTER — Telehealth: Payer: Self-pay | Admitting: Gastroenterology

## 2019-06-20 DIAGNOSIS — C20 Malignant neoplasm of rectum: Secondary | ICD-10-CM

## 2019-06-20 NOTE — Telephone Encounter (Signed)
Pt stated that she is returning your call.  °

## 2019-06-20 NOTE — Telephone Encounter (Signed)
Called patient back, she wanted to know if her husband could pick-up the contrast. I told her yes

## 2019-06-20 NOTE — Telephone Encounter (Signed)
She needs ct scan abd/pelvis with IV and oral contrast.  For abd pain, known rectal cancer. ?obstruction

## 2019-06-20 NOTE — Telephone Encounter (Signed)
Patient called and states she has not had a BM and has not been able to pass any gas since her EGD yesterday. She has taken 6 doses of Miralax and Hyoscyamine-2 tabs every 4 hrs. Feels bloated but abdomin is soft. No v/n. Eating small amts. And drinking plenty of fluids. Says Dr. Ardis Hughs mentioned a possible X-ray. Please advise

## 2019-06-20 NOTE — Telephone Encounter (Signed)
Patient scheduled for CT abd/pelvis with IV and Oral contrast at [facility] 06/22/19 at 12:30pm. Patient is to pick-up 2 bottles of contrast at [facility]. Today or tomorrow. Patient knows to be NPO 4 hrs. Before procedure except for the contrast at 10:30am and 11:30am

## 2019-06-20 NOTE — Telephone Encounter (Signed)
Pt states that she has been taking miralax since yesterday but has not had a bm yet and Dr. Ardis Hughs had mentioned that she could have an x-ray. She would like to speak with you about that.

## 2019-06-20 NOTE — Hospital Discharge Follow-Up (Signed)
Called pt for post-op phone call. Pt states she is in pain and has already called MD Jacob's office for advice. Per pt, CT scan scheduled for Friday. Informed pt that if condition continues to worsen to call MD office again as a hospital visit may be warranted. Pt verbalized understanding.

## 2019-06-22 ENCOUNTER — Telehealth: Payer: Self-pay | Admitting: Gastroenterology

## 2019-06-22 ENCOUNTER — Ambulatory Visit (HOSPITAL_COMMUNITY)
Admission: RE | Admit: 2019-06-22 | Discharge: 2019-06-22 | Disposition: A | Discharge status: Home / Self Care | Payer: PPO | Source: Ambulatory Visit | Attending: Gastroenterology | Admitting: Gastroenterology

## 2019-06-22 ENCOUNTER — Encounter (HOSPITAL_COMMUNITY): Payer: Self-pay

## 2019-06-22 ENCOUNTER — Other Ambulatory Visit: Payer: Self-pay

## 2019-06-22 DIAGNOSIS — C210 Malignant neoplasm of anus, unspecified: Secondary | ICD-10-CM | POA: Diagnosis not present

## 2019-06-22 DIAGNOSIS — C20 Malignant neoplasm of rectum: Secondary | ICD-10-CM

## 2019-06-22 LAB — POCT I-STAT CREATININE: Creatinine, Ser: 0.5 mg/dL (ref 0.44–1.00)

## 2019-06-22 MED ORDER — SODIUM CHLORIDE (PF) 0.9 % IJ SOLN
INTRAMUSCULAR | Status: AC
  Filled 2019-06-22: qty 50

## 2019-06-22 MED ORDER — IOHEXOL 300 MG/ML  SOLN
100.0000 mL | Freq: Once | INTRAMUSCULAR | Status: AC | PRN
  Administered 2019-06-22: 100 mL via INTRAVENOUS

## 2019-06-22 NOTE — Telephone Encounter (Signed)
Called patient and let her know Dr. Ardis Hughs will be calling her with results of her CT after the Radiologist reviews it

## 2019-06-22 NOTE — Telephone Encounter (Signed)
Patient called and states she just finished her CT, is anxious about the results. Also states since last night after trying to eat something, she has had sharp epi-gastric pain with spasms that radiate around the left side. States she did finally have a BM and that is not an issue right now. Please advise

## 2019-06-22 NOTE — Telephone Encounter (Signed)
Patient stated that she is returning your call

## 2019-06-22 NOTE — Telephone Encounter (Signed)
Called patient, she wanted to know if she would get results today from her CT today. I told her if there was something concerning she probably would, if not she might not hear today.

## 2019-06-22 NOTE — Telephone Encounter (Signed)
Let her know that after the CT is reviewed by a radiologist I will get in touch with her.

## 2019-06-22 NOTE — Telephone Encounter (Signed)
Pt stated that she is an a lot of pain and had just finished CT.

## 2019-06-25 ENCOUNTER — Telehealth: Payer: Self-pay | Admitting: Radiation Oncology

## 2019-06-25 ENCOUNTER — Telehealth: Payer: Self-pay | Admitting: Gastroenterology

## 2019-06-25 ENCOUNTER — Ambulatory Visit: Payer: Self-pay | Admitting: Surgery

## 2019-06-25 DIAGNOSIS — K529 Noninfective gastroenteritis and colitis, unspecified: Secondary | ICD-10-CM | POA: Diagnosis not present

## 2019-06-25 DIAGNOSIS — C20 Malignant neoplasm of rectum: Secondary | ICD-10-CM | POA: Diagnosis not present

## 2019-06-25 DIAGNOSIS — K645 Perianal venous thrombosis: Secondary | ICD-10-CM | POA: Diagnosis not present

## 2019-06-25 NOTE — H&P (View-Only) (Signed)
Kathleen Flores Documented: 06/25/2019 8:41 AM Location: [facility] Patient #: [id number] DOB: [birth year] Married / Language: Kathleen Flores / Race: White Female  History of Present Illness Kathleen Hector Flores; 06/25/2019 9:20 AM) The patient is a 73 year old female who presents with colorectal cancer. Note for "Colorectal cancer": ` ` ` Patient sent for surgical consultation at the request of Kathleen Flores  Chief Complaint: Rectal cancer. ` `  Patient returns after neoadjuvant chemoradiation therapy. She completed on 04/25/2019. Tentative date of surgery is later this month on 30 July, 10 weeks later. More recently, She's had some intermittent crampy abdominal pain. She follow-up with gastroenterology. Her symptoms seem to be epigastric so she underwent EGD. Endoscopy was underwhelming. Therefore, Kathleen Flores ordered a CT scan done 3 days ago which showed some ileal thickening concerning for enteritis. Followed by gastroenterology.  She comes in today on a liquid diet feeling better. She was rather obstipated and finally had some diarrhea and felt better last week. She's been passing gas but not moving bowels grade. She's been on a liquid diet since last week. Her abdominal crampiness is gone way down. Her energy levels been okay. No nausea or vomiting.  PRIOR VISIT: The patient is a pleasant active woman that was found to be Hemoccult positive on annual visit. Underwent colonoscopy and found to have a mass in the mid rectum. Biopsy consistent with adenocarcinoma. Surgical consultation requested. Patient comes today by herself. No family history of any colorectal issues. She usually moves her bowels every day. She did have some constipation around the holidays but not an issue now. No frank bleeding. She's had a tubal ligation as well as a hysterectomy in the distant past. She thinks she might have had an appendectomy as well. She walks twice a day for 30  minutes at a time. She does have some hypertension and "white coat syndrome". However no cardiovascular issues. She does not smoke. She is not diabetic. With the diagnosis, her brother is getting a colonoscopy as well. Her mother passed away from bone cancer in 2004. No other family history of cancer that she recalls.   No personal nor family history of Flores/colon cancer, inflammatory bowel disease, irritable bowel syndrome, allergy such as Celiac Sprue, dietary/dairy problems, colitis, ulcers nor gastritis. No recent sick contacts/gastroenteritis. No travel outside the country. No changes in diet. No dysphagia to solids or liquids. No significant heartburn or reflux. No hematochezia, hematemesis, coffee ground emesis. No evidence of prior gastric/peptic ulceration.  (Review of systems as stated in this history (HPI) or in the review of systems. Otherwise all other 12 point ROS are negative) ` ` `   Allergies (Kathleen Flores, CMA; 06/25/2019 8:42 AM) Toprol XL *BETA BLOCKERS* Swollen lips. Allergies Reconciled  Medication History Kathleen Flores, CMA; 06/25/2019 8:43 AM) Xanax (0.5MG  Tablet, 1-2 Oral 30 minutes prior to MRI, Taken starting 03/07/2019) Active. Neomycin Sulfate (500MG  Tablet, 2 (two) Oral SEE NOTE, Taken starting 03/06/2019) Active. (TAKE TWO TABLETS AT 2 PM, 3 PM, AND 10 PM THE DAY PRIOR TO SURGERY) Flagyl (500MG  Tablet, 2 (two) Oral SEE NOTE, Taken starting 03/06/2019) Active. (Take at 2pm, 3pm, and 10pm the day prior to your colon operation) amLODIPine Besylate (Oral) Specific strength unknown - Active. Losartan Potassium (Oral) Specific strength unknown - Active. Synthroid (125MCG Tablet, Oral) Active. Vitamin C (Oral) Specific strength unknown - Active. Vitamin D3 (Oral) Specific strength unknown - Active. Calcium (Oral) Specific strength unknown - Active. Zinc (Oral) Specific strength  unknown - Active. Elderberry (Oral) Specific strength unknown -  Active. Biotin (Oral) Specific strength unknown - Active. amLODIPine Besylate (5MG  Tablet, Oral) Active. ALPRAZolam (0.25MG  Tablet, Oral) Active. Medications Reconciled     Review of Systems Kathleen Flores; 06/25/2019 8:59 AM) General Not Present- Appetite Loss, Chills, Fatigue, Fever, Night Sweats, Weight Gain and Weight Loss. Skin Not Present- Change in Wart/Mole, Dryness, Hives, Jaundice, New Lesions, Non-Healing Wounds, Rash and Ulcer. HEENT Present- Wears glasses/contact lenses. Not Present- Earache, Hearing Loss, Hoarseness, Nose Bleed, Oral Ulcers, Ringing in the Ears, Seasonal Allergies, Sinus Pain, Sore Throat, Visual Disturbances and Yellow Eyes. Respiratory Not Present- Bloody sputum, Chronic Cough, Difficulty Breathing, Snoring and Wheezing. Breast Not Present- Breast Mass, Breast Pain, Nipple Discharge and Skin Changes. Cardiovascular Not Present- Chest Pain, Difficulty Breathing Lying Down, Leg Cramps, Palpitations, Rapid Heart Rate, Shortness of Breath and Swelling of Extremities. Gastrointestinal Not Present- Abdominal Pain, Bloating, Bloody Stool, Change in Bowel Habits, Chronic diarrhea, Constipation, Difficulty Swallowing, Excessive gas, Gets full quickly at meals, Hemorrhoids, Indigestion, Nausea, Rectal Pain and Vomiting. Female Genitourinary Not Present- Frequency, Nocturia, Painful Urination, Pelvic Pain and Urgency. Musculoskeletal Not Present- Back Pain, Joint Pain, Joint Stiffness, Muscle Pain, Muscle Weakness and Swelling of Extremities. Neurological Not Present- Decreased Memory, Fainting, Headaches, Numbness, Seizures, Tingling, Tremor, Trouble walking and Weakness. Endocrine Not Present- Cold Intolerance, Excessive Hunger, Hair Changes, Heat Intolerance, Hot flashes and New Diabetes. Hematology Not Present- Blood Thinners, Easy Bruising, Excessive bleeding, Gland problems, HIV and Persistent Infections.  Vitals (Kathleen Flores CMA; 06/25/2019 8:44  AM) 06/25/2019 8:43 AM Weight: 139.38 lb Height: 67in Body Surface Area: 1.73 m Body Mass Index: 21.83 kg/m  Temp.: 98.36F(Oral)  Pulse: 108 (Regular)  BP: 152/68 (Sitting, Left Arm, Standard)        Physical Exam Kathleen Hector Flores; 06/25/2019 9:19 AM)  General Mental Status-Alert. General Appearance-Not in acute distress, Not Sickly. Orientation-Oriented X3. Hydration-Well hydrated. Voice-Normal. Note: Tearful but consolable. Moves around easily.  Integumentary Global Assessment Normal Exam - Axillae: non-tender, no inflammation or ulceration, no drainage. and Distribution of scalp and body hair is normal. General Characteristics Temperature - normal warmth is noted.  Head and Neck Head-normocephalic, atraumatic with no lesions or palpable masses. Face Global Assessment - atraumatic, no absence of expression. Neck Global Assessment - no abnormal movements, no bruit auscultated on the right, no bruit auscultated on the left, no decreased range of motion, non-tender. Trachea-midline. Thyroid Gland Characteristics - non-tender.  Eye Eyeball - Left-Extraocular movements intact, No Nystagmus - Left. Eyeball - Right-Extraocular movements intact, No Nystagmus - Right. Cornea - Left-No Hazy - Left. Cornea - Right-No Hazy - Right. Sclera/Conjunctiva - Left-No scleral icterus, No Discharge - Left. Sclera/Conjunctiva - Right-No scleral icterus, No Discharge - Right. Pupil - Left-Direct reaction to light normal. Pupil - Right-Direct reaction to light normal. Note: Wears glasses. Vision corrected  ENMT Ears Pinna - no drainage observed, no generalized tenderness observed. Pinna - no drainage observed, no generalized tenderness observed. Nose and Sinuses Nose - no destructive lesion observed. Nares - quiet respiration. Nares - quiet respiration. Mouth and Throat Lips - Upper Lip - no fissures observed, no pallor noted. Lower  Lip - no fissures observed, no pallor noted. Nasopharynx - no discharge present. Oral Cavity/Oropharynx - Tongue - no dryness observed. Oral Mucosa - no cyanosis observed. Hypopharynx - no evidence of airway distress observed.  Chest and Lung Exam Inspection Movements - Normal and Symmetrical. Accessory muscles - No use of accessory muscles in  breathing. Palpation Normal exam - Non-tender. Auscultation Breath sounds - Normal and Clear.  Cardiovascular Auscultation Rhythm - Regular. Murmurs & Other Heart Sounds - Normal exam - No Murmurs and No Systolic Clicks.  Abdomen Inspection Normal Exam - No Visible peristalsis and No Abnormal pulsations. Umbilicus - No Bleeding, No Urine drainage. Palpation/Percussion Normal exam - Soft, Non Tender, No Rebound tenderness, No Rigidity (guarding) and No Cutaneous hyperesthesia. Note: Abdomen soft. Small periumbilical incision consistent with prior tubal ligation. No tenderness. No guarding.Not distended. No diastasis recti. No umbilical or other anterior abdominal wall hernias  Female Genitourinary Sexual Maturity Tanner 5 - Adult hair pattern. Note: No vaginal bleeding nor discharge  Rectal Note: Left lateral thrombosed hemorrhoid 1 cm near resolved. Mildly sensitive. Normal sphincter tone.  I can feel some rectal narrowing consistent with treatment effect. No major tumor. Mobile. Felt a tip of finger = 9 cm.  Peripheral Vascular Upper Extremity Inspection - No Cyanotic nailbeds - Left, Not Ischemic. Inspection - No Cyanotic nailbeds - Right, Not Ischemic.  Neurologic Neurologic evaluation reveals -normal attention span and ability to concentrate, able to name objects and repeat phrases. Appropriate fund of knowledge , normal sensation and normal coordination. Mental Status Affect - not angry, not paranoid. Cranial Nerves-Normal Bilaterally. Gait-Normal.  Neuropsychiatric Mental status exam performed with findings  of-able to articulate well with normal speech/language, rate, volume and coherence, thought content normal with ability to perform basic computations and apply abstract reasoning and no evidence of hallucinations, delusions, obsessions or homicidal/suicidal ideation. Note: Anxious but consolable  Musculoskeletal Global Assessment Spine, Ribs and Pelvis - no instability, subluxation or laxity. Right Upper Extremity - no instability, subluxation or laxity.  Lymphatic Head & Neck General Head & Neck Lymphatics: Bilateral - Description - No Localized lymphadenopathy. Axillary General Axillary Region: Bilateral - Description - No Localized lymphadenopathy. Femoral & Inguinal Generalized Femoral & Inguinal Lymphatics: Left: Right - Description - No Localized lymphadenopathy. Description - No Localized lymphadenopathy.    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  It was felt that She would benefit from neoadjuvant chemoradiation therapy. This was completed in mid May. Plan for surgery in late July, 10 weeks after completion per protocol. We'll make sure that the terminal ileum is not strictured down or require resection. Trying hold off we can, although if she has persistent thickening and stricturing, that can cause more problems may require ileocecectomy as well. Trying hold off that if we can.  She is at risk for needing fecal diversion with loop ileostomy to protect the anastomosis. Does feel higher up, so hopefully we can avoid that.  Current Plans You are being scheduled for surgery- Our schedulers will call you.  You should hear from our office's scheduling department within 5 working days about the location, date, and time of surgery. We try to make accommodations for patient's preferences in scheduling surgery, but sometimes the OR schedule or the surgeon's schedule  prevents [place] from making those accommodations.  If you have not heard from our office [phone]) in 5 working days, call the office and ask for your surgeon's nurse.  If you have other questions about your diagnosis, plan, or surgery, call the office and ask for your surgeon's nurse.  Written instructions provided The anatomy & physiology of the digestive tract was discussed. The pathophysiology of the colon was discussed. Natural history risks without surgery was discussed. I feel  the risks of no intervention will lead to serious problems that outweigh the operative risks; therefore, I recommended a partial colectomy to remove the pathology. Minimally invasive (Robotic/Laparoscopic) & open techniques were discussed.  Risks such as bleeding, infection, abscess, leak, reoperation, possible ostomy, hernia, heart attack, death, and other risks were discussed. I noted a good likelihood this will help address the problem. Goals of post-operative recovery were discussed as well. Need for adequate nutrition, daily bowel regimen and healthy physical activity, to optimize recovery was noted as well. We will work to minimize complications. Educational materials were available as well. Questions were answered. The patient expresses understanding & wishes to proceed with surgery.   PREOP COLON - ENCOUNTER FOR PREOPERATIVE EXAMINATION FOR GENERAL SURGICAL PROCEDURE (Z01.818)  Current Plans Pt Education - [facility] Colon Bowel Prep 2018 ERAS/Miralax/Antibiotics Continued Neomycin Sulfate 500 MG Oral Tablet, 2 (two) Tablet SEE NOTE, #6, 06/25/2019, No Refill. Local Order: Pharmacist Notes: TAKE TWO TABLETS AT 2 PM, 3 PM, AND 10 PM THE DAY PRIOR TO SURGERY Continued Flagyl 500 MG Oral Tablet, 2 (two) Tablet SEE NOTE, #6, 06/25/2019, No Refill. Local Order: Pharmacist Notes: Take at 2pm, 3pm, and 10pm the day prior to your colon operation Pt Education - Pamphlet Given - Laparoscopic Colorectal  Surgery: discussed with patient and provided information. Pt Education - [facility] Colorectal Cancer (AT): discussed with patient and provided information. Pt Education - [facility] Colectomy post-op instructions: discussed with patient and provided information.  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 Seems to be better with a liquid diet and MiraLAX. I think she can do. Blenderized diet to give her a little more food options. Should be able tolerate a consistency. Hopefully will calm down. We'll evaluate. Trying hold off on doing any segmental resection there but will check to rule out any stricturing or obstruction. She has no history of Crohn's or regional enteritis. No story that's very convincing for infection. She is feeling better.   THROMBOSED HEMORRHOIDS (K64.5) Impression: Mildly irritated thrombosed hemorrhoid most likely related to diarrhea last week after overcoming her partial obstruction. Continue resolved. Hold off on any surgical intervention  Kathleen Flores, FACS, MASCRS Gastrointestinal and Minimally Invasive Surgery    [address], [place], [place] [address] [phone] Main / Paging [phone] Fax

## 2019-06-25 NOTE — Telephone Encounter (Signed)
Pt called to inform Dr. Ardis Hughs that she is feeling much better today. Her surgery is still scheduled for 7/30 and she said Dr. Johney Maine this morning.

## 2019-06-25 NOTE — Telephone Encounter (Signed)
I spoke with the patient to let her know we were involved in discussion with Dr. Johney Maine and Dr. 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 I let her know that radiation was the most likely reason for her symptoms, and while these issues do not happen frequently, they can be very uncomfortable and frustrating. The patient reported she had no regrets and praised the care she received and even feels better now. She is hopeful that she will continue to feel well and is scheduled for surgery on 07/12/2019. I will follow up with her again later this week to see how she's doing.

## 2019-06-25 NOTE — Telephone Encounter (Signed)
Dr Jac Canavan the pt called to state she is feeling much better and will have surgery as planned

## 2019-06-25 NOTE — H&P (Signed)
Kathleen Flores Documented: 06/25/2019 8:41 AM Location: [facility] Patient #: [id number] DOB: 1946/06/01 Married / Language: [name] / Race: White Female  History of Present Illness Adin Hector MD; 06/25/2019 9:20 AM) The patient is a 73 year old female who presents with colorectal cancer. Note for "Colorectal cancer": ` ` ` Patient sent for surgical consultation at the request of Dr Dalene Carrow GI  Chief Complaint: Rectal cancer. ` `  Patient returns after neoadjuvant chemoradiation therapy. She completed on 04/25/2019. Tentative date of surgery is later this month on 30 July, 10 weeks later. More recently, She's had some intermittent crampy abdominal pain. She follow-up with gastroenterology. Her symptoms seem to be epigastric so she underwent EGD. Endoscopy was underwhelming. Therefore, Dr. Ardis Hughs ordered a CT scan done 3 days ago which showed some ileal thickening concerning for enteritis. Followed by gastroenterology.  She comes in today on a liquid diet feeling better. She was rather obstipated and finally had some diarrhea and felt better last week. She's been passing gas but not moving bowels grade. She's been on a liquid diet since last week. Her abdominal crampiness is gone way down. Her energy levels been okay. No nausea or vomiting.  PRIOR VISIT: The patient is a pleasant active woman that was found to be Hemoccult positive on annual visit. Underwent colonoscopy and found to have a mass in the mid rectum. Biopsy consistent with adenocarcinoma. Surgical consultation requested. Patient comes today by herself. No family history of any colorectal issues. She usually moves her bowels every day. She did have some constipation around the holidays but not an issue now. No frank bleeding. She's had a tubal ligation as well as a hysterectomy in the distant past. She thinks she might have had an appendectomy as well. She walks twice a day for 30  minutes at a time. She does have some hypertension and "white coat syndrome". However no cardiovascular issues. She does not smoke. She is not diabetic. With the diagnosis, her brother is getting a colonoscopy as well. Her mother passed away from bone cancer in 2004. No other family history of cancer that she recalls.   No personal nor family history of GI/colon cancer, inflammatory bowel disease, irritable bowel syndrome, allergy such as Celiac Sprue, dietary/dairy problems, colitis, ulcers nor gastritis. No recent sick contacts/gastroenteritis. No travel outside the country. No changes in diet. No dysphagia to solids or liquids. No significant heartburn or reflux. No hematochezia, hematemesis, coffee ground emesis. No evidence of prior gastric/peptic ulceration.  (Review of systems as stated in this history (HPI) or in the review of systems. Otherwise all other 12 point ROS are negative) ` ` `   Allergies (Sabrina Canty, CMA; 06/25/2019 8:42 AM) Toprol XL *BETA BLOCKERS* Swollen lips. Allergies Reconciled  Medication History Nance Pew, CMA; 06/25/2019 8:43 AM) Xanax (0.5MG  Tablet, 1-2 Oral 30 minutes prior to MRI, Taken starting 03/07/2019) Active. Neomycin Sulfate (500MG  Tablet, 2 (two) Oral SEE NOTE, Taken starting 03/06/2019) Active. (TAKE TWO TABLETS AT 2 PM, 3 PM, AND 10 PM THE DAY PRIOR TO SURGERY) Flagyl (500MG  Tablet, 2 (two) Oral SEE NOTE, Taken starting 03/06/2019) Active. (Take at 2pm, 3pm, and 10pm the day prior to your colon operation) amLODIPine Besylate (Oral) Specific strength unknown - Active. Losartan Potassium (Oral) Specific strength unknown - Active. Synthroid (125MCG Tablet, Oral) Active. Vitamin C (Oral) Specific strength unknown - Active. Vitamin D3 (Oral) Specific strength unknown - Active. Calcium (Oral) Specific strength unknown - Active. Zinc (Oral) Specific strength  unknown - Active. Elderberry (Oral) Specific strength unknown -  Active. Biotin (Oral) Specific strength unknown - Active. amLODIPine Besylate (5MG  Tablet, Oral) Active. ALPRAZolam (0.25MG  Tablet, Oral) Active. Medications Reconciled     Review of Systems Adin Hector, MD; 06/25/2019 8:59 AM) General Not Present- Appetite Loss, Chills, Fatigue, Fever, Night Sweats, Weight Gain and Weight Loss. Skin Not Present- Change in Wart/Mole, Dryness, Hives, Jaundice, New Lesions, Non-Healing Wounds, Rash and Ulcer. HEENT Present- Wears glasses/contact lenses. Not Present- Earache, Hearing Loss, Hoarseness, Nose Bleed, Oral Ulcers, Ringing in the Ears, Seasonal Allergies, Sinus Pain, Sore Throat, Visual Disturbances and Yellow Eyes. Respiratory Not Present- Bloody sputum, Chronic Cough, Difficulty Breathing, Snoring and Wheezing. Breast Not Present- Breast Mass, Breast Pain, Nipple Discharge and Skin Changes. Cardiovascular Not Present- Chest Pain, Difficulty Breathing Lying Down, Leg Cramps, Palpitations, Rapid Heart Rate, Shortness of Breath and Swelling of Extremities. Gastrointestinal Not Present- Abdominal Pain, Bloating, Bloody Stool, Change in Bowel Habits, Chronic diarrhea, Constipation, Difficulty Swallowing, Excessive gas, Gets full quickly at meals, Hemorrhoids, Indigestion, Nausea, Rectal Pain and Vomiting. Female Genitourinary Not Present- Frequency, Nocturia, Painful Urination, Pelvic Pain and Urgency. Musculoskeletal Not Present- Back Pain, Joint Pain, Joint Stiffness, Muscle Pain, Muscle Weakness and Swelling of Extremities. Neurological Not Present- Decreased Memory, Fainting, Headaches, Numbness, Seizures, Tingling, Tremor, Trouble walking and Weakness. Endocrine Not Present- Cold Intolerance, Excessive Hunger, Hair Changes, Heat Intolerance, Hot flashes and New Diabetes. Hematology Not Present- Blood Thinners, Easy Bruising, Excessive bleeding, Gland problems, HIV and Persistent Infections.  Vitals (Sabrina Canty CMA; 06/25/2019 8:44  AM) 06/25/2019 8:43 AM Weight: 139.38 lb Height: 67in Body Surface Area: 1.73 m Body Mass Index: 21.83 kg/m  Temp.: 98.9F(Oral)  Pulse: 108 (Regular)  BP: 152/68 (Sitting, Left Arm, Standard)        Physical Exam Adin Hector MD; 06/25/2019 9:19 AM)  General Mental Status-Alert. General Appearance-Not in acute distress, Not Sickly. Orientation-Oriented X3. Hydration-Well hydrated. Voice-Normal. Note: Tearful but consolable. Moves around easily.  Integumentary Global Assessment Normal Exam - Axillae: non-tender, no inflammation or ulceration, no drainage. and Distribution of scalp and body hair is normal. General Characteristics Temperature - normal warmth is noted.  Head and Neck Head-normocephalic, atraumatic with no lesions or palpable masses. Face Global Assessment - atraumatic, no absence of expression. Neck Global Assessment - no abnormal movements, no bruit auscultated on the right, no bruit auscultated on the left, no decreased range of motion, non-tender. Trachea-midline. Thyroid Gland Characteristics - non-tender.  Eye Eyeball - Left-Extraocular movements intact, No Nystagmus - Left. Eyeball - Right-Extraocular movements intact, No Nystagmus - Right. Cornea - Left-No Hazy - Left. Cornea - Right-No Hazy - Right. Sclera/Conjunctiva - Left-No scleral icterus, No Discharge - Left. Sclera/Conjunctiva - Right-No scleral icterus, No Discharge - Right. Pupil - Left-Direct reaction to light normal. Pupil - Right-Direct reaction to light normal. Note: Wears glasses. Vision corrected  ENMT Ears Pinna - no drainage observed, no generalized tenderness observed. Pinna - no drainage observed, no generalized tenderness observed. Nose and Sinuses Nose - no destructive lesion observed. Nares - quiet respiration. Nares - quiet respiration. Mouth and Throat Lips - Upper Lip - no fissures observed, no pallor noted. Lower  Lip - no fissures observed, no pallor noted. Nasopharynx - no discharge present. Oral Cavity/Oropharynx - Tongue - no dryness observed. Oral Mucosa - no cyanosis observed. Hypopharynx - no evidence of airway distress observed.  Chest and Lung Exam Inspection Movements - Normal and Symmetrical. Accessory muscles - No use of accessory muscles in  breathing. Palpation Normal exam - Non-tender. Auscultation Breath sounds - Normal and Clear.  Cardiovascular Auscultation Rhythm - Regular. Murmurs & Other Heart Sounds - Normal exam - No Murmurs and No Systolic Clicks.  Abdomen Inspection Normal Exam - No Visible peristalsis and No Abnormal pulsations. Umbilicus - No Bleeding, No Urine drainage. Palpation/Percussion Normal exam - Soft, Non Tender, No Rebound tenderness, No Rigidity (guarding) and No Cutaneous hyperesthesia. Note: Abdomen soft. Small periumbilical incision consistent with prior tubal ligation. No tenderness. No guarding.Not distended. No diastasis recti. No umbilical or other anterior abdominal wall hernias  Female Genitourinary Sexual Maturity Tanner 5 - Adult hair pattern. Note: No vaginal bleeding nor discharge  Rectal Note: Left lateral thrombosed hemorrhoid 1 cm near resolved. Mildly sensitive. Normal sphincter tone.  I can feel some rectal narrowing consistent with treatment effect. No major tumor. Mobile. Felt a tip of finger = 9 cm.  Peripheral Vascular Upper Extremity Inspection - No Cyanotic nailbeds - Left, Not Ischemic. Inspection - No Cyanotic nailbeds - Right, Not Ischemic.  Neurologic Neurologic evaluation reveals -normal attention span and ability to concentrate, able to name objects and repeat phrases. Appropriate fund of knowledge , normal sensation and normal coordination. Mental Status Affect - not angry, not paranoid. Cranial Nerves-Normal Bilaterally. Gait-Normal.  Neuropsychiatric Mental status exam performed with findings  of-able to articulate well with normal speech/language, rate, volume and coherence, thought content normal with ability to perform basic computations and apply abstract reasoning and no evidence of hallucinations, delusions, obsessions or homicidal/suicidal ideation. Note: Anxious but consolable  Musculoskeletal Global Assessment Spine, Ribs and Pelvis - no instability, subluxation or laxity. Right Upper Extremity - no instability, subluxation or laxity.  Lymphatic Head & Neck General Head & Neck Lymphatics: Bilateral - Description - No Localized lymphadenopathy. Axillary General Axillary Region: Bilateral - Description - No Localized lymphadenopathy. Femoral & Inguinal Generalized Femoral & Inguinal Lymphatics: Left: Right - Description - No Localized lymphadenopathy. Description - No Localized lymphadenopathy.    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  It was felt that She would benefit from neoadjuvant chemoradiation therapy. This was completed in mid May. Plan for surgery in late July, 10 weeks after completion per protocol. We'll make sure that the terminal ileum is not strictured down or require resection. Trying hold off we can, although if she has persistent thickening and stricturing, that can cause more problems may require ileocecectomy as well. Trying hold off that if we can.  She is at risk for needing fecal diversion with loop ileostomy to protect the anastomosis. Does feel higher up, so hopefully we can avoid that.  Current Plans You are being scheduled for surgery- Our schedulers will call you.  You should hear from our office's scheduling department within 5 working days about the location, date, and time of surgery. We try to make accommodations for patient's preferences in scheduling surgery, but sometimes the OR schedule or the surgeon's schedule  prevents [place] from making those accommodations.  If you have not heard from our office [phone]) in 5 working days, call the office and ask for your surgeon's nurse.  If you have other questions about your diagnosis, plan, or surgery, call the office and ask for your surgeon's nurse.  Written instructions provided The anatomy & physiology of the digestive tract was discussed. The pathophysiology of the colon was discussed. Natural history risks without surgery was discussed. I feel  the risks of no intervention will lead to serious problems that outweigh the operative risks; therefore, I recommended a partial colectomy to remove the pathology. Minimally invasive (Robotic/Laparoscopic) & open techniques were discussed.  Risks such as bleeding, infection, abscess, leak, reoperation, possible ostomy, hernia, heart attack, death, and other risks were discussed. I noted a good likelihood this will help address the problem. Goals of post-operative recovery were discussed as well. Need for adequate nutrition, daily bowel regimen and healthy physical activity, to optimize recovery was noted as well. We will work to minimize complications. Educational materials were available as well. Questions were answered. The patient expresses understanding & wishes to proceed with surgery.   PREOP COLON - ENCOUNTER FOR PREOPERATIVE EXAMINATION FOR GENERAL SURGICAL PROCEDURE (Z01.818)  Current Plans Pt Education - [facility] Colon Bowel Prep 2018 ERAS/Miralax/Antibiotics Continued Neomycin Sulfate 500 MG Oral Tablet, 2 (two) Tablet SEE NOTE, #6, 06/25/2019, No Refill. Local Order: Pharmacist Notes: TAKE TWO TABLETS AT 2 PM, 3 PM, AND 10 PM THE DAY PRIOR TO SURGERY Continued Flagyl 500 MG Oral Tablet, 2 (two) Tablet SEE NOTE, #6, 06/25/2019, No Refill. Local Order: Pharmacist Notes: Take at 2pm, 3pm, and 10pm the day prior to your colon operation Pt Education - Pamphlet Given - Laparoscopic Colorectal  Surgery: discussed with patient and provided information. Pt Education - [facility] Colorectal Cancer (AT): discussed with patient and provided information. Pt Education - [facility] Colectomy post-op instructions: discussed with patient and provided information.  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 Seems to be better with a liquid diet and MiraLAX. I think she can do. Blenderized diet to give her a little more food options. Should be able tolerate a consistency. Hopefully will calm down. We'll evaluate. Trying hold off on doing any segmental resection there but will check to rule out any stricturing or obstruction. She has no history of Crohn's or regional enteritis. No story that's very convincing for infection. She is feeling better.   THROMBOSED HEMORRHOIDS (K64.5) Impression: Mildly irritated thrombosed hemorrhoid most likely related to diarrhea last week after overcoming her partial obstruction. Continue resolved. Hold off on any surgical intervention  Adin Hector, MD, FACS, MASCRS Gastrointestinal and Minimally Invasive Surgery    [address], [place], [place] [address] [phone] Main / Paging [phone] Fax

## 2019-06-28 ENCOUNTER — Telehealth: Payer: Self-pay | Admitting: Radiation Oncology

## 2019-06-28 NOTE — Telephone Encounter (Signed)
I spoke with the patient and she is doing well with the glutamine supplements and is able to continue her smoothies and liquids. She is looking forward to surgery in a few weeks. She will continue the glutamine until then and I encouraged her to call us or Dr. Johney Maine if she had recurring episodes of nausea/vomiting.

## 2019-06-28 NOTE — Progress Notes (Signed)
  Radiation Oncology         [phone]) [phone] ________________________________  Name: Kathleen Flores MRN: [id number]  Date: 04/27/2019  DOB: [birth year]  End of Treatment Note  Diagnosis:   73 y.o. female with Stage IIIB, [id number] adenocarcinoma of the rectum   Indication for treatment:  Curative       Radiation treatment dates:   03/19/2019 - 04/27/2019  Site/dose:    1. Rectum / 45 Gy in 25 fractions 2. Rectum Boost / 5.4 Gy in 3 fractions Total dose: 50.4 Gy  Beams/energy:    1. 3D / 15X, 6X Photon 2. 15X, 10X photon boost  Narrative: The patient tolerated radiation treatment relatively well.   She experienced rectal tenderness. Her bowel movements alternated between constipation and diarrhea. She also noted mild fatigue and poor appetite. No complains of nausea or vomiting.  Plan: The patient has completed radiation treatment. The patient will return to radiation oncology clinic for routine followup in one month. I advised them to call or return sooner if they have any questions or concerns related to their recovery or treatment.  ------------------------------------------------  Jodelle Gross, MD, PhD  This document serves as a record of services personally performed by Kyung Rudd, MD. It was created on his behalf by Rae Lips, a trained medical scribe. The creation of this record is based on the scribe's personal observations and the provider's statements to them. This document has been checked and approved by the attending provider.

## 2019-07-03 ENCOUNTER — Telehealth: Payer: Self-pay | Admitting: Oncology

## 2019-07-03 ENCOUNTER — Telehealth: Payer: Self-pay

## 2019-07-03 NOTE — Telephone Encounter (Signed)
Pt called to say her surgery with Dr. Marcello Moores  was pushed back until 7/30. She has an appointment on 8/3 and is asking if it should be rescheduled. Scheduling message sent to reschedule appointment for week of 8/17.

## 2019-07-03 NOTE — Telephone Encounter (Signed)
Scheduled appt per 7/21 sch message- cancelled 8/3 - pt aware of new apt date and time

## 2019-07-05 NOTE — Progress Notes (Addendum)
EKG 03-08-19 Epic  EMAIL CONFIRMATION FOR OSTOMY NURSE DAY OF PAT ON CHART

## 2019-07-05 NOTE — Patient Instructions (Addendum)
YOU NEED TO HAVE A COVID 19 TEST ON 07-09-2019 AT 1:55 PM. GO TO [address], [place], [place]. THIS TEST MUST BE DONE BEFORE SURGERY.  ONCE YOUR COVID TEST IS COMPLETED, PLEASE BEGIN THE QUARANTINE INSTRUCTIONS AS OUTLINED IN YOUR HANDOUT.                Kathleen Flores    Your procedure is scheduled on: 7-30 2020   Report to [facility]  Entrance    Report to  [facility] AM.    1 VISITOR IS ALLOWED TO WAIT IN WAITING ROOM  ON DAY OF YOUR SURGERY.    Call this number if you have problems the morning of surgery [phone]    Remember: Do not eat food  :After Midnight on Tuesday NIGHT. DRINK PLENTY OF CLEAR LIQUIDS ALL DAY WEDNESDAY 07-11-2019.  FOLLOW ALL BOWEL PREP INSTRUCTIONS FROM DR GROSS, DRINK PLENTY OF WATER WITH BOWEL PREP TO AVOID DEHYDRATION.    BRUSH YOUR TEETH MORNING OF SURGERY AND RINSE YOUR MOUTH OUT, NO CHEWING GUM CANDY OR MINTS.   E.R.A.S.  DRINK INSTRUCTIONS DRINK 2 PRE-SURGERY ENSURE DRINKS THE NIGHT BEFORE SURGERY AT 10:00 PM.  CONTINUE DRINKING CLEAR LIQUIDS. ON THE MORNING OF SURGERY AT 4:30 AM , STOP THE CLEAR LIQUID DIET AND DRINK THE LAST PRE-SURGERY ENSURE. NOTHING BY MOUTH AFTER THE LAST ENSURE.    CLEAR LIQUID DIET   Foods Allowed                                                                     Foods Excluded  Coffee and tea, regular and decaf                             liquids that you cannot  Plain Jell-O any favor except red or purple              see through such as: Fruit ices (not with fruit pulp)                                     milk, soups, orange juice  Iced Popsicles                                    All solid food Carbonated beverages, regular and diet                                    Cranberry, grape and apple juices  (NO RED/PURPLE) Sports drinks like Gatorade Lightly seasoned clear broth or consume(fat free) Sugar, honey syrup  Sample Menu Breakfast                                Lunch  Supper Cranberry juice                    Beef broth                            Chicken broth Jell-O                                     Grape juice                           Apple juice Coffee or tea                        Jell-O                                      Popsicle                                                Coffee or tea                        Coffee or tea  _____________________________________________________________________    Take these medicines the morning of surgery with A SIP OF WATER: AMLODIPINE, LEVOTHYROXINE, XANAX IF NEEDED                                You may not have any metal on your body including hair pins and              piercings  Do not wear jewelry, make-up, lotions, powders or perfumes, deodorant             Do not wear nail polish.  Do not shave  48 hours prior to surgery.               Do not bring valuables to the hospital. [place].  Contacts, dentures or bridgework may not be worn into surgery.  Leave suitcase in the car. After surgery it may be brought to your room.      _____________________________________________________________________             [facility] - Preparing for Surgery Before surgery, you can play an important role.  Because skin is not sterile, your skin needs to be as free of germs as possible.  You can reduce the number of germs on your skin by washing with CHG (chlorahexidine gluconate) soap before surgery.  CHG is an antiseptic cleaner which kills germs and bonds with the skin to continue killing germs even after washing. Please DO NOT use if you have an allergy to CHG or antibacterial soaps.  If your skin becomes reddened/irritated stop using the CHG and inform your nurse when you arrive at Short Stay. Do not shave (including legs and underarms) for at least 48 hours prior to the first CHG shower.  You may shave your face/neck. Please  follow these instructions  carefully:  1.  Shower with CHG Soap the night before surgery and the  morning of Surgery.  2.  If you choose to wash your hair, wash your hair first as usual with your  normal  shampoo.  3.  After you shampoo, rinse your hair and body thoroughly to remove the  shampoo.                           4.  Use CHG as you would any other liquid soap.  You can apply chg directly  to the skin and wash                       Gently with a scrungie or clean washcloth.  5.  Apply the CHG Soap to your body ONLY FROM THE NECK DOWN.   Do not use on face/ open                           Wound or open sores. Avoid contact with eyes, ears mouth and genitals (private parts).                       Wash face,  Genitals (private parts) with your normal soap.             6.  Wash thoroughly, paying special attention to the area where your surgery  will be performed.  7.  Thoroughly rinse your body with warm water from the neck down.  8.  DO NOT shower/wash with your normal soap after using and rinsing off  the CHG Soap.                9.  Pat yourself dry with a clean towel.            10.  Wear clean pajamas.            11.  Place clean sheets on your bed the night of your first shower and do not  sleep with pets. Day of Surgery : Do not apply any lotions/deodorants the morning of surgery.  Please wear clean clothes to the hospital/surgery center.  FAILURE TO FOLLOW THESE INSTRUCTIONS MAY RESULT IN THE CANCELLATION OF YOUR SURGERY PATIENT SIGNATURE_________________________________  NURSE SIGNATURE__________________________________  ________________________________________________________________________   Kathleen Flores  An incentive spirometer is a tool that can help keep your lungs clear and active. This tool measures how well you are filling your lungs with each breath. Taking long deep breaths may help reverse or decrease the chance of developing breathing (pulmonary) problems  (especially infection) following:  A long period of time when you are unable to move or be active. BEFORE THE PROCEDURE   If the spirometer includes an indicator to show your best effort, your nurse or respiratory therapist will set it to a desired goal.  If possible, sit up straight or lean slightly forward. Try not to slouch.  Hold the incentive spirometer in an upright position. INSTRUCTIONS FOR USE  1. Sit on the edge of your bed if possible, or sit up as far as you can in bed or on a chair. 2. Hold the incentive spirometer in an upright position. 3. Breathe out normally. 4. Place the mouthpiece in your mouth and seal your lips tightly around it. 5. Breathe in slowly and as deeply as possible, raising the piston or the ball toward the top of  the column. 6. Hold your breath for 3-5 seconds or for as long as possible. Allow the piston or ball to fall to the bottom of the column. 7. Remove the mouthpiece from your mouth and breathe out normally. 8. Rest for a few seconds and repeat Steps 1 through 7 at least 10 times every 1-2 hours when you are awake. Take your time and take a few normal breaths between deep breaths. 9. The spirometer may include an indicator to show your best effort. Use the indicator as a goal to work toward during each repetition. 10. After each set of 10 deep breaths, practice coughing to be sure your lungs are clear. If you have an incision (the cut made at the time of surgery), support your incision when coughing by placing a pillow or rolled up towels firmly against it. Once you are able to get out of bed, walk around indoors and cough well. You may stop using the incentive spirometer when instructed by your caregiver.  RISKS AND COMPLICATIONS  Take your time so you do not get dizzy or light-headed.  If you are in pain, you may need to take or ask for pain medication before doing incentive spirometry. It is harder to take a deep breath if you are having  pain. AFTER USE  Rest and breathe slowly and easily.  It can be helpful to keep track of a log of your progress. Your caregiver can provide you with a simple table to help with this. If you are using the spirometer at home, follow these instructions: [place] IF:   You are having difficultly using the spirometer.  You have trouble using the spirometer as often as instructed.  Your pain medication is not giving enough relief while using the spirometer.  You develop fever of 100.5 F (38.1 C) or higher. SEEK IMMEDIATE MEDICAL CARE IF:   You cough up bloody sputum that had not been present before.  You develop fever of 102 F (38.9 C) or greater.  You develop worsening pain at or near the incision site. MAKE SURE YOU:   Understand these instructions.  Will watch your condition.  Will get help right away if you are not doing well or get worse. Document Released: 04/11/2007 Document Revised: 02/21/2012 Document Reviewed: 06/12/2007 ExitCare Patient Information 2014 ExitCare, [place].   ________________________________________________________________________  WHAT IS A BLOOD TRANSFUSION? Blood Transfusion Information  A transfusion is the replacement of blood or some of its parts. Blood is made up of multiple cells which provide different functions.  Red blood cells carry oxygen and are used for blood loss replacement.  White blood cells fight against infection.  Platelets control bleeding.  Plasma helps clot blood.  Other blood products are available for specialized needs, such as hemophilia or other clotting disorders. BEFORE THE TRANSFUSION  Who gives blood for transfusions?   Healthy volunteers who are fully evaluated to make sure their blood is safe. This is blood bank blood. Transfusion therapy is the safest it has ever been in the practice of medicine. Before blood is taken from a donor, a complete history is taken to make sure that person has no history  of diseases nor engages in risky social behavior (examples are intravenous drug use or sexual activity with multiple partners). The donor's travel history is screened to minimize risk of transmitting infections, such as malaria. The donated blood is tested for signs of infectious diseases, such as HIV and hepatitis. The blood is then tested to be  sure it is compatible with you in order to minimize the chance of a transfusion reaction. If you or a relative donates blood, this is often done in anticipation of surgery and is not appropriate for emergency situations. It takes many days to process the donated blood. RISKS AND COMPLICATIONS Although transfusion therapy is very safe and saves many lives, the main dangers of transfusion include:   Getting an infectious disease.  Developing a transfusion reaction. This is an allergic reaction to something in the blood you were given. Every precaution is taken to prevent this. The decision to have a blood transfusion has been considered carefully by your caregiver before blood is given. Blood is not given unless the benefits outweigh the risks. AFTER THE TRANSFUSION  Right after receiving a blood transfusion, you will usually feel much better and more energetic. This is especially true if your red blood cells have gotten low (anemic). The transfusion raises the level of the red blood cells which carry oxygen, and this usually causes an energy increase.  The nurse administering the transfusion will monitor you carefully for complications. HOME CARE INSTRUCTIONS  No special instructions are needed after a transfusion. You may find your energy is better. Speak with your caregiver about any limitations on activity for underlying diseases you may have. SEEK MEDICAL CARE IF:   Your condition is not improving after your transfusion.  You develop redness or irritation at the intravenous (IV) site. SEEK IMMEDIATE MEDICAL CARE IF:  Any of the following symptoms  occur over the next 12 hours:  Shaking chills.  You have a temperature by mouth above 102 F (38.9 C), not controlled by medicine.  Chest, back, or muscle pain.  People around you feel you are not acting correctly or are confused.  Shortness of breath or difficulty breathing.  Dizziness and fainting.  You get a rash or develop hives.  You have a decrease in urine output.  Your urine turns a dark color or changes to pink, red, or brown. Any of the following symptoms occur over the next 10 days:  You have a temperature by mouth above 102 F (38.9 C), not controlled by medicine.  Shortness of breath.  Weakness after normal activity.  The white part of the eye turns yellow (jaundice).  You have a decrease in the amount of urine or are urinating less often.  Your urine turns a dark color or changes to pink, red, or brown. Document Released: 11/26/2000 Document Revised: 02/21/2012 Document Reviewed: 07/15/2008 [facility] Patient Information 2014 [place], [place].  _______________________________________________________________________

## 2019-07-06 ENCOUNTER — Encounter (HOSPITAL_COMMUNITY)
Admission: RE | Admit: 2019-07-06 | Discharge: 2019-07-06 | Disposition: A | Discharge status: Home / Self Care | Payer: PPO | Source: Ambulatory Visit | Attending: Surgery | Admitting: Surgery

## 2019-07-06 ENCOUNTER — Other Ambulatory Visit: Payer: Self-pay

## 2019-07-06 ENCOUNTER — Encounter (HOSPITAL_COMMUNITY): Payer: Self-pay

## 2019-07-06 DIAGNOSIS — K56699 Other intestinal obstruction unspecified as to partial versus complete obstruction: Secondary | ICD-10-CM | POA: Diagnosis not present

## 2019-07-06 DIAGNOSIS — C20 Malignant neoplasm of rectum: Secondary | ICD-10-CM | POA: Insufficient documentation

## 2019-07-06 DIAGNOSIS — Z01812 Encounter for preprocedural laboratory examination: Secondary | ICD-10-CM | POA: Insufficient documentation

## 2019-07-06 HISTORY — DX: Personal history of antineoplastic chemotherapy: Z92.21

## 2019-07-06 HISTORY — DX: Burn of unspecified body region, unspecified degree: T30.0

## 2019-07-06 HISTORY — DX: Personal history of irradiation: Z92.3

## 2019-07-06 LAB — CBC
HCT: 37 % (ref 36.0–46.0)
Hemoglobin: 11.4 g/dL — ABNORMAL LOW (ref 12.0–15.0)
MCH: 30.6 pg (ref 26.0–34.0)
MCHC: 30.8 g/dL (ref 30.0–36.0)
MCV: 99.5 fL (ref 80.0–100.0)
Platelets: 240 10*3/uL (ref 150–400)
RBC: 3.72 MIL/uL — ABNORMAL LOW (ref 3.87–5.11)
RDW: 11.8 % (ref 11.5–15.5)
WBC: 4.8 10*3/uL (ref 4.0–10.5)
nRBC: 0 % (ref 0.0–0.2)

## 2019-07-06 LAB — BASIC METABOLIC PANEL
Anion gap: 5 (ref 5–15)
BUN: 15 mg/dL (ref 8–23)
CO2: 29 mmol/L (ref 22–32)
Calcium: 9.2 mg/dL (ref 8.9–10.3)
Chloride: 104 mmol/L (ref 98–111)
Creatinine, Ser: 0.49 mg/dL (ref 0.44–1.00)
GFR calc Af Amer: >60 mL/min (ref >60)
GFR calc non Af Amer: >60 mL/min (ref >60)
Glucose, Bld: 114 mg/dL — ABNORMAL HIGH (ref 70–99)
Potassium: 4.2 mmol/L (ref 3.5–5.1)
Sodium: 138 mmol/L (ref 135–145)

## 2019-07-06 NOTE — Consult Note (Signed)
[facility] Nurse wound consult note  [place] [facility] Nurse requested for preoperative stoma site marking by Dr. Johney [place].  Discussed surgical procedure and stoma creation with patient.  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 As we talk, she repeats that she never would have believed that she would need an ostomy. She still holds out hope that she does not get an ostomy.  She expresses her confidence in her surgeon, Dr. Johney [place].  Explained role of the [facility] nurse team.   Answered patient questions.   Note: Patient was previously marked on 3/26 by my partner, K. Sanders.  Examined patient sitting and standing in order to place the marking in the patient's visual field, away from any creases or abdominal contour issues and within the rectus muscle. She has lost weight after chemo and radiation. She has a deep crease and a roll in the subumbilical area.    Marked for colostomy in the LLQ  6 cm to the left of the umbilicus and 4 cm below the umbilicus.  Marked for ileostomy in the RUQ  5cm to the right of the umbilicus and  1 cm above the umbilicus.  Patient's abdomen cleansed with CHG wipes at site markings, allowed to air dry prior to marking.Covered marks with thin film transparent dressings to preserve mark until date of surgery (July 12, 2019).   [facility] Nurse team will follow up with patient after surgery for continued ostomy care and teaching.   Thanks, Maudie Flakes, MSN, RN, [place], Arther Abbott  Pager# [phone]

## 2019-07-07 LAB — HEMOGLOBIN A1C
Hgb A1c MFr Bld: 5.7 % — ABNORMAL HIGH (ref 4.8–5.6)
Mean Plasma Glucose: 117 mg/dL

## 2019-07-09 ENCOUNTER — Other Ambulatory Visit (HOSPITAL_COMMUNITY)
Admission: RE | Admit: 2019-07-09 | Discharge: 2019-07-09 | Disposition: A | Discharge status: Home / Self Care | Payer: PPO | Source: Ambulatory Visit | Attending: Surgery | Admitting: Surgery

## 2019-07-09 DIAGNOSIS — Z01812 Encounter for preprocedural laboratory examination: Secondary | ICD-10-CM | POA: Diagnosis not present

## 2019-07-10 LAB — SARS CORONAVIRUS 2 (TAT 6-24 HRS): SARS Coronavirus 2: NEGATIVE

## 2019-07-11 MED ORDER — BUPIVACAINE LIPOSOME 1.3 % IJ SUSP
20.0000 mL | Freq: Once | INTRAMUSCULAR | Status: DC
  Filled 2019-07-11: qty 20

## 2019-07-11 MED ORDER — SODIUM CHLORIDE 0.9 % IV SOLN
INTRAVENOUS | Status: DC
  Filled 2019-07-11: qty 6

## 2019-07-12 ENCOUNTER — Inpatient Hospital Stay (HOSPITAL_COMMUNITY): Payer: PPO | Admitting: Physician Assistant

## 2019-07-12 ENCOUNTER — Other Ambulatory Visit: Payer: Self-pay

## 2019-07-12 ENCOUNTER — Encounter (HOSPITAL_COMMUNITY): Payer: Self-pay | Admitting: *Deleted

## 2019-07-12 ENCOUNTER — Inpatient Hospital Stay (HOSPITAL_COMMUNITY): Payer: PPO | Admitting: Anesthesiology

## 2019-07-12 ENCOUNTER — Inpatient Hospital Stay (HOSPITAL_COMMUNITY)
Admission: RE | Admit: 2019-07-12 | Discharge: 2019-07-19 | DRG: 330 | Disposition: A | Discharge status: Home Health | Payer: PPO | Attending: Surgery | Admitting: Surgery

## 2019-07-12 ENCOUNTER — Encounter (HOSPITAL_COMMUNITY)
Admission: RE | Disposition: A | Discharge status: Home Health | Payer: Self-pay | Source: Home / Self Care | Attending: Surgery

## 2019-07-12 DIAGNOSIS — E039 Hypothyroidism, unspecified: Secondary | ICD-10-CM | POA: Diagnosis present

## 2019-07-12 DIAGNOSIS — K219 Gastro-esophageal reflux disease without esophagitis: Secondary | ICD-10-CM | POA: Diagnosis not present

## 2019-07-12 DIAGNOSIS — Z9851 Tubal ligation status: Secondary | ICD-10-CM | POA: Diagnosis not present

## 2019-07-12 DIAGNOSIS — Z79890 Hormone replacement therapy (postmenopausal): Secondary | ICD-10-CM | POA: Diagnosis not present

## 2019-07-12 DIAGNOSIS — Z90710 Acquired absence of both cervix and uterus: Secondary | ICD-10-CM

## 2019-07-12 DIAGNOSIS — C772 Secondary and unspecified malignant neoplasm of intra-abdominal lymph nodes: Secondary | ICD-10-CM | POA: Diagnosis not present

## 2019-07-12 DIAGNOSIS — Z20828 Contact with and (suspected) exposure to other viral communicable diseases: Secondary | ICD-10-CM | POA: Diagnosis present

## 2019-07-12 DIAGNOSIS — E782 Mixed hyperlipidemia: Secondary | ICD-10-CM | POA: Diagnosis present

## 2019-07-12 DIAGNOSIS — K5669 Other partial intestinal obstruction: Secondary | ICD-10-CM | POA: Diagnosis present

## 2019-07-12 DIAGNOSIS — K56699 Other intestinal obstruction unspecified as to partial versus complete obstruction: Secondary | ICD-10-CM | POA: Diagnosis not present

## 2019-07-12 DIAGNOSIS — Z9221 Personal history of antineoplastic chemotherapy: Secondary | ICD-10-CM

## 2019-07-12 DIAGNOSIS — Y842 Radiological procedure and radiotherapy as the cause of abnormal reaction of the patient, or of later complication, without mention of misadventure at the time of the procedure: Secondary | ICD-10-CM | POA: Diagnosis present

## 2019-07-12 DIAGNOSIS — F411 Generalized anxiety disorder: Secondary | ICD-10-CM | POA: Diagnosis not present

## 2019-07-12 DIAGNOSIS — C20 Malignant neoplasm of rectum: Secondary | ICD-10-CM | POA: Diagnosis present

## 2019-07-12 DIAGNOSIS — K559 Vascular disorder of intestine, unspecified: Secondary | ICD-10-CM | POA: Diagnosis not present

## 2019-07-12 DIAGNOSIS — Z79899 Other long term (current) drug therapy: Secondary | ICD-10-CM | POA: Diagnosis not present

## 2019-07-12 DIAGNOSIS — C19 Malignant neoplasm of rectosigmoid junction: Secondary | ICD-10-CM | POA: Diagnosis not present

## 2019-07-12 DIAGNOSIS — E559 Vitamin D deficiency, unspecified: Secondary | ICD-10-CM | POA: Diagnosis present

## 2019-07-12 DIAGNOSIS — I10 Essential (primary) hypertension: Secondary | ICD-10-CM | POA: Diagnosis present

## 2019-07-12 DIAGNOSIS — K645 Perianal venous thrombosis: Secondary | ICD-10-CM | POA: Diagnosis present

## 2019-07-12 DIAGNOSIS — E876 Hypokalemia: Secondary | ICD-10-CM | POA: Diagnosis present

## 2019-07-12 DIAGNOSIS — R198 Other specified symptoms and signs involving the digestive system and abdomen: Secondary | ICD-10-CM

## 2019-07-12 DIAGNOSIS — Z932 Ileostomy status: Secondary | ICD-10-CM

## 2019-07-12 HISTORY — DX: Cardiac murmur, unspecified: R01.1

## 2019-07-12 HISTORY — PX: XI ROBOTIC ASSISTED LOWER ANTERIOR RESECTION: SHX6558

## 2019-07-12 LAB — GLUCOSE, CAPILLARY: Glucose-Capillary: 111 mg/dL — ABNORMAL HIGH (ref 70–99)

## 2019-07-12 LAB — TYPE AND SCREEN
ABO/RH(D): O POS
Antibody Screen: NEGATIVE

## 2019-07-12 SURGERY — RESECTION, RECTUM, LOW ANTERIOR, ROBOT-ASSISTED
Anesthesia: General | Site: Abdomen

## 2019-07-12 MED ORDER — DIPHENHYDRAMINE HCL 50 MG/ML IJ SOLN: 12.5000 mg | Freq: Four times a day (QID) | INTRAMUSCULAR | Status: DC | PRN

## 2019-07-12 MED ORDER — VITAMIN B-12 1000 MCG PO TABS
5000.0000 ug | ORAL_TABLET | Freq: Every day | ORAL | Status: DC
  Administered 2019-07-13 – 2019-07-15 (×3): 5000 ug via ORAL
  Filled 2019-07-12 (×4): qty 5

## 2019-07-12 MED ORDER — PHENYLEPHRINE 40 MCG/ML (10ML) SYRINGE FOR IV PUSH (FOR BLOOD PRESSURE SUPPORT)
PREFILLED_SYRINGE | INTRAVENOUS | Status: DC | PRN
  Administered 2019-07-12: 80 ug via INTRAVENOUS
  Administered 2019-07-12: 40 ug via INTRAVENOUS
  Administered 2019-07-12 (×3): 80 ug via INTRAVENOUS

## 2019-07-12 MED ORDER — NEOMYCIN SULFATE 500 MG PO TABS: 1000.0000 mg | ORAL_TABLET | ORAL | Status: DC

## 2019-07-12 MED ORDER — HYDROMORPHONE HCL 1 MG/ML IJ SOLN
0.5000 mg | INTRAMUSCULAR | Status: DC | PRN
  Administered 2019-07-12 – 2019-07-13 (×6): 1 mg via INTRAVENOUS
  Filled 2019-07-12 (×5): qty 1

## 2019-07-12 MED ORDER — MAGNESIUM OXIDE 400 (241.3 MG) MG PO TABS
400.0000 mg | ORAL_TABLET | Freq: Every day | ORAL | Status: DC
  Administered 2019-07-14 – 2019-07-15 (×2): 400 mg via ORAL
  Filled 2019-07-12 (×4): qty 1

## 2019-07-12 MED ORDER — FENTANYL CITRATE (PF) 100 MCG/2ML IJ SOLN: 25.0000 ug | Freq: Once | INTRAMUSCULAR | Status: DC

## 2019-07-12 MED ORDER — SACCHAROMYCES BOULARDII 250 MG PO CAPS
250.0000 mg | ORAL_CAPSULE | Freq: Two times a day (BID) | ORAL | Status: DC
  Administered 2019-07-12 – 2019-07-15 (×7): 250 mg via ORAL
  Filled 2019-07-12 (×8): qty 1

## 2019-07-12 MED ORDER — 0.9 % SODIUM CHLORIDE (POUR BTL) OPTIME
TOPICAL | Status: DC | PRN
  Administered 2019-07-12: 09:00:00 2000 mL

## 2019-07-12 MED ORDER — ENOXAPARIN SODIUM 40 MG/0.4ML ~~LOC~~ SOLN
40.0000 mg | Freq: Once | SUBCUTANEOUS | Status: AC
  Administered 2019-07-12: 40 mg via SUBCUTANEOUS
  Filled 2019-07-12: qty 0.4

## 2019-07-12 MED ORDER — KETAMINE HCL 10 MG/ML IJ SOLN
INTRAMUSCULAR | Status: AC
  Filled 2019-07-12: qty 1

## 2019-07-12 MED ORDER — VITAMIN D 25 MCG (1000 UNIT) PO TABS
5000.0000 [IU] | ORAL_TABLET | Freq: Every day | ORAL | Status: DC
  Administered 2019-07-14 – 2019-07-19 (×5): 5000 [IU] via ORAL
  Filled 2019-07-12 (×7): qty 5

## 2019-07-12 MED ORDER — ACETAMINOPHEN 500 MG PO TABS
1000.0000 mg | ORAL_TABLET | Freq: Four times a day (QID) | ORAL | Status: DC
  Administered 2019-07-12 – 2019-07-19 (×23): 1000 mg via ORAL
  Filled 2019-07-12 (×25): qty 2

## 2019-07-12 MED ORDER — BUPIVACAINE-EPINEPHRINE (PF) 0.25% -1:200000 IJ SOLN
INTRAMUSCULAR | Status: AC
  Filled 2019-07-12: qty 60

## 2019-07-12 MED ORDER — LACTATED RINGERS IV SOLN
INTRAVENOUS | Status: DC
  Administered 2019-07-12 (×3): via INTRAVENOUS

## 2019-07-12 MED ORDER — METOPROLOL TARTRATE 5 MG/5ML IV SOLN: 5.0000 mg | Freq: Four times a day (QID) | INTRAVENOUS | Status: DC | PRN

## 2019-07-12 MED ORDER — BUPIVACAINE-EPINEPHRINE (PF) 0.25% -1:200000 IJ SOLN
INTRAMUSCULAR | Status: DC | PRN
  Administered 2019-07-12: 60 mL

## 2019-07-12 MED ORDER — ACETAMINOPHEN 10 MG/ML IV SOLN
INTRAVENOUS | Status: AC
  Filled 2019-07-12: qty 100

## 2019-07-12 MED ORDER — ONDANSETRON HCL 4 MG/2ML IJ SOLN
4.0000 mg | Freq: Four times a day (QID) | INTRAMUSCULAR | Status: DC | PRN
  Administered 2019-07-13 – 2019-07-17 (×5): 4 mg via INTRAVENOUS
  Filled 2019-07-12 (×5): qty 2

## 2019-07-12 MED ORDER — OXYCODONE HCL 5 MG/5ML PO SOLN: 5.0000 mg | Freq: Once | ORAL | Status: AC | PRN

## 2019-07-12 MED ORDER — TRAMADOL HCL 50 MG PO TABS
ORAL_TABLET | ORAL | Status: AC
  Filled 2019-07-12: qty 1

## 2019-07-12 MED ORDER — CHLORHEXIDINE GLUCONATE CLOTH 2 % EX PADS: 6.0000 | MEDICATED_PAD | Freq: Once | CUTANEOUS | Status: DC

## 2019-07-12 MED ORDER — OXYCODONE HCL 5 MG PO TABS
ORAL_TABLET | ORAL | Status: AC
  Filled 2019-07-12: qty 1

## 2019-07-12 MED ORDER — MAGIC MOUTHWASH
15.0000 mL | Freq: Four times a day (QID) | ORAL | Status: DC | PRN
  Filled 2019-07-12: qty 15

## 2019-07-12 MED ORDER — ROCURONIUM BROMIDE 10 MG/ML (PF) SYRINGE
PREFILLED_SYRINGE | INTRAVENOUS | Status: AC
  Filled 2019-07-12: qty 10

## 2019-07-12 MED ORDER — LIDOCAINE 2% (20 MG/ML) 5 ML SYRINGE
INTRAMUSCULAR | Status: DC | PRN
  Administered 2019-07-12: 60 mg via INTRAVENOUS

## 2019-07-12 MED ORDER — ROCURONIUM BROMIDE 10 MG/ML (PF) SYRINGE
PREFILLED_SYRINGE | INTRAVENOUS | Status: DC | PRN
  Administered 2019-07-12: 20 mg via INTRAVENOUS
  Administered 2019-07-12: 10 mg via INTRAVENOUS
  Administered 2019-07-12: 20 mg via INTRAVENOUS
  Administered 2019-07-12: 60 mg via INTRAVENOUS

## 2019-07-12 MED ORDER — FENTANYL CITRATE (PF) 100 MCG/2ML IJ SOLN
25.0000 ug | INTRAMUSCULAR | Status: DC | PRN
  Administered 2019-07-12 (×3): 50 ug via INTRAVENOUS

## 2019-07-12 MED ORDER — TRAMADOL HCL 50 MG PO TABS: 50.0000 mg | ORAL_TABLET | Freq: Four times a day (QID) | ORAL | 0 refills | Status: DC | PRN

## 2019-07-12 MED ORDER — ALVIMOPAN 12 MG PO CAPS
12.0000 mg | ORAL_CAPSULE | Freq: Two times a day (BID) | ORAL | Status: DC
  Administered 2019-07-13 (×2): 12 mg via ORAL
  Filled 2019-07-12 (×3): qty 1

## 2019-07-12 MED ORDER — PROPOFOL 10 MG/ML IV BOLUS
INTRAVENOUS | Status: DC | PRN
  Administered 2019-07-12: 40 mg via INTRAVENOUS
  Administered 2019-07-12: 100 mg via INTRAVENOUS

## 2019-07-12 MED ORDER — ALUM & MAG HYDROXIDE-SIMETH 200-200-20 MG/5ML PO SUSP
30.0000 mL | Freq: Four times a day (QID) | ORAL | Status: DC | PRN
  Administered 2019-07-13 – 2019-07-19 (×7): 30 mL via ORAL
  Filled 2019-07-12 (×7): qty 30

## 2019-07-12 MED ORDER — LIDOCAINE 2% (20 MG/ML) 5 ML SYRINGE
INTRAMUSCULAR | Status: AC
  Filled 2019-07-12: qty 5

## 2019-07-12 MED ORDER — GABAPENTIN 100 MG PO CAPS
200.0000 mg | ORAL_CAPSULE | Freq: Three times a day (TID) | ORAL | Status: DC
  Administered 2019-07-12 – 2019-07-15 (×11): 200 mg via ORAL
  Filled 2019-07-12 (×12): qty 2

## 2019-07-12 MED ORDER — FENTANYL CITRATE (PF) 100 MCG/2ML IJ SOLN
INTRAMUSCULAR | Status: AC
  Filled 2019-07-12: qty 2

## 2019-07-12 MED ORDER — OXYCODONE HCL 5 MG PO TABS
5.0000 mg | ORAL_TABLET | Freq: Once | ORAL | Status: AC | PRN
  Administered 2019-07-12: 5 mg via ORAL

## 2019-07-12 MED ORDER — LIP MEDEX EX OINT
1.0000 "application " | TOPICAL_OINTMENT | Freq: Two times a day (BID) | CUTANEOUS | Status: DC
  Administered 2019-07-12 – 2019-07-19 (×14): 1 via TOPICAL
  Filled 2019-07-12 (×5): qty 7

## 2019-07-12 MED ORDER — HYDRALAZINE HCL 20 MG/ML IJ SOLN: 10.0000 mg | INTRAMUSCULAR | Status: DC | PRN

## 2019-07-12 MED ORDER — KETAMINE HCL 10 MG/ML IJ SOLN
INTRAMUSCULAR | Status: DC | PRN
  Administered 2019-07-12: 20 mg via INTRAVENOUS

## 2019-07-12 MED ORDER — SODIUM CHLORIDE 0.9 % IV SOLN
2.0000 g | Freq: Two times a day (BID) | INTRAVENOUS | Status: AC
  Administered 2019-07-12: 2 g via INTRAVENOUS
  Filled 2019-07-12: qty 2

## 2019-07-12 MED ORDER — LABETALOL HCL 5 MG/ML IV SOLN
INTRAVENOUS | Status: AC
  Filled 2019-07-12: qty 4

## 2019-07-12 MED ORDER — LACTATED RINGERS IR SOLN
Status: DC | PRN
  Administered 2019-07-12: 1000 mL

## 2019-07-12 MED ORDER — PSYLLIUM 95 % PO PACK
1.0000 | PACK | Freq: Every day | ORAL | Status: DC
  Filled 2019-07-12: qty 1

## 2019-07-12 MED ORDER — VITAMIN C 500 MG PO TABS
1000.0000 mg | ORAL_TABLET | Freq: Every day | ORAL | Status: DC
  Administered 2019-07-14 – 2019-07-15 (×2): 1000 mg via ORAL
  Filled 2019-07-12 (×4): qty 2

## 2019-07-12 MED ORDER — PROCHLORPERAZINE MALEATE 10 MG PO TABS: 10.0000 mg | ORAL_TABLET | Freq: Four times a day (QID) | ORAL | Status: DC | PRN

## 2019-07-12 MED ORDER — PHENYLEPHRINE HCL (PRESSORS) 10 MG/ML IV SOLN
INTRAVENOUS | Status: AC
  Filled 2019-07-12: qty 1

## 2019-07-12 MED ORDER — ALPRAZOLAM 0.25 MG PO TABS
0.2500 mg | ORAL_TABLET | Freq: Two times a day (BID) | ORAL | Status: DC | PRN
  Administered 2019-07-13: 0.25 mg via ORAL
  Filled 2019-07-12: qty 1

## 2019-07-12 MED ORDER — SUGAMMADEX SODIUM 200 MG/2ML IV SOLN
INTRAVENOUS | Status: DC | PRN
  Administered 2019-07-12: 200 mg via INTRAVENOUS

## 2019-07-12 MED ORDER — ONDANSETRON HCL 4 MG/2ML IJ SOLN
INTRAMUSCULAR | Status: AC
  Filled 2019-07-12: qty 2

## 2019-07-12 MED ORDER — ACETAMINOPHEN 10 MG/ML IV SOLN
1000.0000 mg | Freq: Once | INTRAVENOUS | Status: DC | PRN
  Administered 2019-07-12: 1000 mg via INTRAVENOUS

## 2019-07-12 MED ORDER — ONDANSETRON HCL 4 MG/2ML IJ SOLN
INTRAMUSCULAR | Status: DC | PRN
  Administered 2019-07-12: 4 mg via INTRAVENOUS

## 2019-07-12 MED ORDER — PROPOFOL 10 MG/ML IV BOLUS
INTRAVENOUS | Status: AC
  Filled 2019-07-12: qty 20

## 2019-07-12 MED ORDER — SODIUM CHLORIDE 0.9 % IV SOLN
INTRAVENOUS | Status: DC | PRN
  Administered 2019-07-12: 25 ug/min via INTRAVENOUS

## 2019-07-12 MED ORDER — ENOXAPARIN SODIUM 40 MG/0.4ML ~~LOC~~ SOLN
40.0000 mg | SUBCUTANEOUS | Status: DC
  Administered 2019-07-13 – 2019-07-19 (×7): 40 mg via SUBCUTANEOUS
  Filled 2019-07-12 (×7): qty 0.4

## 2019-07-12 MED ORDER — ONDANSETRON HCL 4 MG PO TABS
4.0000 mg | ORAL_TABLET | Freq: Four times a day (QID) | ORAL | Status: DC | PRN
  Administered 2019-07-16 (×2): 4 mg via ORAL
  Filled 2019-07-12 (×2): qty 1

## 2019-07-12 MED ORDER — BUPIVACAINE LIPOSOME 1.3 % IJ SUSP
INTRAMUSCULAR | Status: DC | PRN
  Administered 2019-07-12: 20 mL

## 2019-07-12 MED ORDER — BISACODYL 5 MG PO TBEC
20.0000 mg | DELAYED_RELEASE_TABLET | Freq: Once | ORAL | Status: DC
  Filled 2019-07-12: qty 4

## 2019-07-12 MED ORDER — SODIUM CHLORIDE 0.9 % IV SOLN
2.0000 g | INTRAVENOUS | Status: AC
  Administered 2019-07-12: 2 g via INTRAVENOUS
  Filled 2019-07-12: qty 2

## 2019-07-12 MED ORDER — FENTANYL CITRATE (PF) 100 MCG/2ML IJ SOLN
25.0000 ug | INTRAMUSCULAR | Status: DC | PRN
  Administered 2019-07-12 (×2): 50 ug via INTRAVENOUS

## 2019-07-12 MED ORDER — EPHEDRINE SULFATE-NACL 50-0.9 MG/10ML-% IV SOSY
PREFILLED_SYRINGE | INTRAVENOUS | Status: DC | PRN
  Administered 2019-07-12: 2.5 mg via INTRAVENOUS
  Administered 2019-07-12: 5 mg via INTRAVENOUS

## 2019-07-12 MED ORDER — TRAMADOL HCL 50 MG PO TABS
50.0000 mg | ORAL_TABLET | Freq: Four times a day (QID) | ORAL | Status: DC | PRN
  Administered 2019-07-12 – 2019-07-13 (×2): 50 mg via ORAL
  Administered 2019-07-14 – 2019-07-16 (×5): 100 mg via ORAL
  Administered 2019-07-16: 50 mg via ORAL
  Administered 2019-07-17 – 2019-07-18 (×2): 100 mg via ORAL
  Filled 2019-07-12 (×6): qty 2
  Filled 2019-07-12: qty 1
  Filled 2019-07-12 (×2): qty 2
  Filled 2019-07-12: qty 1

## 2019-07-12 MED ORDER — DEXAMETHASONE SODIUM PHOSPHATE 4 MG/ML IJ SOLN
INTRAMUSCULAR | Status: DC | PRN
  Administered 2019-07-12: 10 mg via INTRAVENOUS

## 2019-07-12 MED ORDER — SODIUM CHLORIDE 0.9 % IV SOLN
INTRAVENOUS | Status: DC | PRN
  Administered 2019-07-12: 11:00:00 1000 mL via INTRAPERITONEAL

## 2019-07-12 MED ORDER — LIDOCAINE HCL 2 % IJ SOLN
INTRAMUSCULAR | Status: AC
  Filled 2019-07-12: qty 20

## 2019-07-12 MED ORDER — ENSURE SURGERY PO LIQD
237.0000 mL | Freq: Two times a day (BID) | ORAL | Status: DC
  Administered 2019-07-13 – 2019-07-15 (×4): 237 mL via ORAL
  Filled 2019-07-12 (×10): qty 237

## 2019-07-12 MED ORDER — ALVIMOPAN 12 MG PO CAPS
12.0000 mg | ORAL_CAPSULE | ORAL | Status: AC
  Administered 2019-07-12: 12 mg via ORAL
  Filled 2019-07-12: qty 1

## 2019-07-12 MED ORDER — EPHEDRINE 5 MG/ML INJ
INTRAVENOUS | Status: AC
  Filled 2019-07-12: qty 10

## 2019-07-12 MED ORDER — AMLODIPINE BESYLATE 5 MG PO TABS
5.0000 mg | ORAL_TABLET | Freq: Every day | ORAL | Status: DC
  Administered 2019-07-13 – 2019-07-15 (×3): 5 mg via ORAL
  Filled 2019-07-12 (×4): qty 1

## 2019-07-12 MED ORDER — MIDAZOLAM HCL 5 MG/5ML IJ SOLN
INTRAMUSCULAR | Status: DC | PRN
  Administered 2019-07-12: 2 mg via INTRAVENOUS

## 2019-07-12 MED ORDER — POLYETHYLENE GLYCOL 3350 17 GM/SCOOP PO POWD: 1.0000 | Freq: Once | ORAL | Status: DC

## 2019-07-12 MED ORDER — MIDAZOLAM HCL 2 MG/2ML IJ SOLN
INTRAMUSCULAR | Status: AC
  Filled 2019-07-12: qty 2

## 2019-07-12 MED ORDER — LEVOTHYROXINE SODIUM 25 MCG PO TABS
125.0000 ug | ORAL_TABLET | Freq: Every day | ORAL | Status: DC
  Administered 2019-07-13 – 2019-07-19 (×7): 125 ug via ORAL
  Filled 2019-07-12 (×8): qty 1

## 2019-07-12 MED ORDER — FENTANYL CITRATE (PF) 100 MCG/2ML IJ SOLN
INTRAMUSCULAR | Status: DC | PRN
  Administered 2019-07-12 (×5): 50 ug via INTRAVENOUS
  Administered 2019-07-12: 100 ug via INTRAVENOUS
  Administered 2019-07-12: 50 ug via INTRAVENOUS
  Administered 2019-07-12: 25 ug via INTRAVENOUS

## 2019-07-12 MED ORDER — SODIUM CHLORIDE 0.9 % IV SOLN: Freq: Three times a day (TID) | INTRAVENOUS | Status: AC | PRN

## 2019-07-12 MED ORDER — LACTATED RINGERS IV SOLN
INTRAVENOUS | Status: DC
  Administered 2019-07-12 – 2019-07-13 (×2): via INTRAVENOUS

## 2019-07-12 MED ORDER — INDOCYANINE GREEN 25 MG IV SOLR
INTRAVENOUS | Status: DC | PRN
  Administered 2019-07-12: 7.5 mg via INTRAVENOUS

## 2019-07-12 MED ORDER — GABAPENTIN 300 MG PO CAPS
300.0000 mg | ORAL_CAPSULE | ORAL | Status: AC
  Administered 2019-07-12: 300 mg via ORAL
  Filled 2019-07-12: qty 1

## 2019-07-12 MED ORDER — LABETALOL HCL 5 MG/ML IV SOLN
INTRAVENOUS | Status: DC | PRN
  Administered 2019-07-12 (×2): 5 mg via INTRAVENOUS

## 2019-07-12 MED ORDER — HYDROMORPHONE HCL 1 MG/ML IJ SOLN
INTRAMUSCULAR | Status: AC
  Filled 2019-07-12: qty 1

## 2019-07-12 MED ORDER — DIPHENHYDRAMINE HCL 12.5 MG/5ML PO ELIX: 12.5000 mg | ORAL_SOLUTION | Freq: Four times a day (QID) | ORAL | Status: DC | PRN

## 2019-07-12 MED ORDER — PROCHLORPERAZINE EDISYLATE 10 MG/2ML IJ SOLN
5.0000 mg | Freq: Four times a day (QID) | INTRAMUSCULAR | Status: DC | PRN
  Administered 2019-07-16 – 2019-07-17 (×2): 10 mg via INTRAVENOUS
  Filled 2019-07-12 (×2): qty 2

## 2019-07-12 MED ORDER — ACETAMINOPHEN 160 MG/5ML PO SOLN: 1000.0000 mg | Freq: Once | ORAL | Status: DC | PRN

## 2019-07-12 MED ORDER — LOSARTAN POTASSIUM 50 MG PO TABS
100.0000 mg | ORAL_TABLET | Freq: Every day | ORAL | Status: DC
  Administered 2019-07-13 – 2019-07-15 (×3): 100 mg via ORAL
  Filled 2019-07-12 (×4): qty 2

## 2019-07-12 MED ORDER — LIDOCAINE 20MG/ML (2%) 15 ML SYRINGE OPTIME
INTRAMUSCULAR | Status: DC | PRN
  Administered 2019-07-12: 1.5 mg/kg/h via INTRAVENOUS

## 2019-07-12 MED ORDER — HYOSCYAMINE SULFATE 0.125 MG SL SUBL
0.2500 mg | SUBLINGUAL_TABLET | SUBLINGUAL | Status: DC | PRN
  Administered 2019-07-13 – 2019-07-14 (×2): 0.25 mg via SUBLINGUAL
  Filled 2019-07-12 (×5): qty 2

## 2019-07-12 MED ORDER — BIOTIN 5000 MCG PO TABS: 5000.0000 ug | ORAL_TABLET | Freq: Every day | ORAL | Status: DC

## 2019-07-12 MED ORDER — ACETAMINOPHEN 500 MG PO TABS: 1000.0000 mg | ORAL_TABLET | Freq: Once | ORAL | Status: DC | PRN

## 2019-07-12 MED ORDER — ACETAMINOPHEN 500 MG PO TABS
1000.0000 mg | ORAL_TABLET | ORAL | Status: AC
  Administered 2019-07-12: 1000 mg via ORAL
  Filled 2019-07-12: qty 2

## 2019-07-12 MED ORDER — FENTANYL CITRATE (PF) 250 MCG/5ML IJ SOLN
INTRAMUSCULAR | Status: AC
  Filled 2019-07-12: qty 5

## 2019-07-12 MED ORDER — DEXAMETHASONE SODIUM PHOSPHATE 10 MG/ML IJ SOLN
INTRAMUSCULAR | Status: AC
  Filled 2019-07-12: qty 1

## 2019-07-12 MED ORDER — METRONIDAZOLE 500 MG PO TABS: 1000.0000 mg | ORAL_TABLET | ORAL | Status: DC

## 2019-07-12 SURGICAL SUPPLY — 122 items
APPLIER CLIP 5 13 M/L LIGAMAX5 (MISCELLANEOUS)
APPLIER CLIP ROT 10 11.4 M/L (STAPLE)
APR CLP MED LRG 11.4X10 (STAPLE)
APR CLP MED LRG 5 ANG JAW (MISCELLANEOUS)
BARRIER SKIN 2 3/4 (OSTOMY) ×2 IMPLANT
BARRIER SKIN 2 3/4 INCH (OSTOMY) ×1
BARRIER SKIN OD2.25 2 3/4 FLNG (OSTOMY) IMPLANT
BLADE EXTENDED COATED 6.5IN (ELECTRODE) ×3 IMPLANT
BRR SKN FLT 2.75X2.25 2 PC (OSTOMY) ×1
CANNULA REDUC XI 12-8 STAPL (CANNULA) ×1
CANNULA REDUC XI 12-8MM STAPL (CANNULA) ×1
CANNULA REDUCER 12-8 DVNC XI (CANNULA) ×1 IMPLANT
CELLS DAT CNTRL 66122 CELL SVR (MISCELLANEOUS) IMPLANT
CLIP APPLIE 5 13 M/L LIGAMAX5 (MISCELLANEOUS) IMPLANT
CLIP APPLIE ROT 10 11.4 M/L (STAPLE) IMPLANT
CLIP VESOLOCK LG 6/CT PURPLE (CLIP) IMPLANT
CLIP VESOLOCK MED LG 6/CT (CLIP) IMPLANT
COVER SURGICAL LIGHT HANDLE (MISCELLANEOUS) ×6 IMPLANT
COVER TIP SHEARS 8 DVNC (MISCELLANEOUS) ×1 IMPLANT
COVER TIP SHEARS 8MM DA VINCI (MISCELLANEOUS) ×2
COVER WAND RF STERILE (DRAPES) IMPLANT
DECANTER SPIKE VIAL GLASS SM (MISCELLANEOUS) ×5 IMPLANT
DEVICE TROCAR PUNCTURE CLOSURE (ENDOMECHANICALS) IMPLANT
DRAIN CHANNEL 19F RND (DRAIN) ×3 IMPLANT
DRAPE ARM DVNC X/XI (DISPOSABLE) ×4 IMPLANT
DRAPE COLUMN DVNC XI (DISPOSABLE) ×1 IMPLANT
DRAPE DA VINCI XI ARM (DISPOSABLE) ×8
DRAPE DA VINCI XI COLUMN (DISPOSABLE) ×2
DRAPE SURG IRRIG POUCH 19X23 (DRAPES) ×3 IMPLANT
DRSG OPSITE POSTOP 4X10 (GAUZE/BANDAGES/DRESSINGS) IMPLANT
DRSG OPSITE POSTOP 4X6 (GAUZE/BANDAGES/DRESSINGS) IMPLANT
DRSG OPSITE POSTOP 4X8 (GAUZE/BANDAGES/DRESSINGS) IMPLANT
DRSG TEGADERM 2-3/8X2-3/4 SM (GAUZE/BANDAGES/DRESSINGS) ×7 IMPLANT
DRSG TEGADERM 4X4.75 (GAUZE/BANDAGES/DRESSINGS) ×3 IMPLANT
ELECT PENCIL ROCKER SW 15FT (MISCELLANEOUS) ×3 IMPLANT
ELECT REM PT RETURN 15FT ADLT (MISCELLANEOUS) ×3 IMPLANT
ENDOLOOP SUT PDS II  0 18 (SUTURE)
ENDOLOOP SUT PDS II 0 18 (SUTURE) IMPLANT
EVACUATOR SILICONE 100CC (DRAIN) ×3 IMPLANT
GAUZE SPONGE 2X2 8PLY STRL LF (GAUZE/BANDAGES/DRESSINGS) ×1 IMPLANT
GAUZE SPONGE 4X4 12PLY STRL (GAUZE/BANDAGES/DRESSINGS) IMPLANT
GLOVE BIO SURGEON STRL SZ7.5 (GLOVE) ×4 IMPLANT
GLOVE BIOGEL PI IND STRL 7.5 (GLOVE) IMPLANT
GLOVE BIOGEL PI INDICATOR 7.5 (GLOVE) ×8
GLOVE ECLIPSE 8.0 STRL XLNG CF (GLOVE) ×11 IMPLANT
GLOVE INDICATOR 8.0 STRL GRN (GLOVE) ×15 IMPLANT
GLOVE SURG SS PI 7.0 STRL IVOR (GLOVE) ×4 IMPLANT
GOWN STRL REUS W/TWL XL LVL3 (GOWN DISPOSABLE) ×19 IMPLANT
GRASPER SUT TROCAR 14GX15 (MISCELLANEOUS) ×3 IMPLANT
HOLDER FOLEY CATH W/STRAP (MISCELLANEOUS) ×3 IMPLANT
IRRIG SUCT STRYKERFLOW 2 WTIP (MISCELLANEOUS) ×3
IRRIGATION SUCT STRKRFLW 2 WTP (MISCELLANEOUS) IMPLANT
KIT PROCEDURE DA VINCI SI (MISCELLANEOUS) ×2
KIT PROCEDURE DVNC SI (MISCELLANEOUS) IMPLANT
KIT SIGMOIDOSCOPE (SET/KITS/TRAYS/PACK) ×2 IMPLANT
KIT TURNOVER KIT A (KITS) IMPLANT
NDL INSUFFLATION 14GA 120MM (NEEDLE) ×1 IMPLANT
NEEDLE INSUFFLATION 14GA 120MM (NEEDLE) ×3 IMPLANT
PACK CARDIOVASCULAR III (CUSTOM PROCEDURE TRAY) ×3 IMPLANT
PACK COLON (CUSTOM PROCEDURE TRAY) ×3 IMPLANT
PAD POSITIONING PINK XL (MISCELLANEOUS) ×3 IMPLANT
PORT LAP GEL ALEXIS MED 5-9CM (MISCELLANEOUS) ×3 IMPLANT
POUCH OSTOMY 2 3/4  H 3804 (WOUND CARE) ×2
POUCH OSTOMY 2 3/4 H 3804 (WOUND CARE) ×1
POUCH OSTOMY 2 PC DRNBL 2.75 (WOUND CARE) IMPLANT
PROTECTOR NERVE ULNAR (MISCELLANEOUS) ×4 IMPLANT
RELOAD PROXIMATE 75MM BLUE (ENDOMECHANICALS) ×3 IMPLANT
RELOAD STAPLE 45 BLU REG DVNC (STAPLE) IMPLANT
RELOAD STAPLE 45 GRN THCK DVNC (STAPLE) IMPLANT
RELOAD STAPLE 75 3.8 BLU REG (ENDOMECHANICALS) IMPLANT
RETRACTOR WND ALEXIS 18 MED (MISCELLANEOUS) IMPLANT
RTRCTR WOUND ALEXIS 18CM MED (MISCELLANEOUS)
SCISSORS LAP 5X35 DISP (ENDOMECHANICALS) ×3 IMPLANT
SEAL CANN UNIV 5-8 DVNC XI (MISCELLANEOUS) ×4 IMPLANT
SEAL XI 5MM-8MM UNIVERSAL (MISCELLANEOUS) ×8
SEALER VESSEL DA VINCI XI (MISCELLANEOUS) ×2
SEALER VESSEL EXT DVNC XI (MISCELLANEOUS) ×1 IMPLANT
SLEEVE ADV FIXATION 5X100MM (TROCAR) IMPLANT
SOLUTION ELECTROLUBE (MISCELLANEOUS) ×3 IMPLANT
SPONGE GAUZE 2X2 STER 10/PKG (GAUZE/BANDAGES/DRESSINGS) ×2
SPONGE LAP 18X18 X RAY DECT (DISPOSABLE) ×2 IMPLANT
STAPLER 45 BLU RELOAD XI (STAPLE) IMPLANT
STAPLER 45 BLUE RELOAD XI (STAPLE)
STAPLER 45 GREEN RELOAD XI (STAPLE) ×2
STAPLER 45 GRN RELOAD XI (STAPLE) ×1 IMPLANT
STAPLER 90 3.5 STAND SLIM (STAPLE) ×3
STAPLER 90 3.5 STD SLIM (STAPLE) IMPLANT
STAPLER CANNULA SEAL DVNC XI (STAPLE) ×1 IMPLANT
STAPLER CANNULA SEAL XI (STAPLE) ×2
STAPLER ECHELON POWER CIR 31 (STAPLE) ×2 IMPLANT
STAPLER PROXIMATE 75MM BLUE (STAPLE) ×2 IMPLANT
STAPLER SHEATH (SHEATH) ×2
STAPLER SHEATH ENDOWRIST DVNC (SHEATH) ×1 IMPLANT
SURGILUBE 2OZ TUBE FLIPTOP (MISCELLANEOUS) ×3 IMPLANT
SUT MNCRL AB 4-0 PS2 18 (SUTURE) ×3 IMPLANT
SUT PDS AB 1 CT1 27 (SUTURE) ×8 IMPLANT
SUT PDS AB 1 TP1 96 (SUTURE) IMPLANT
SUT PROLENE 0 CT 2 (SUTURE) ×2 IMPLANT
SUT PROLENE 2 0 KS (SUTURE) ×2 IMPLANT
SUT PROLENE 2 0 SH DA (SUTURE) ×2 IMPLANT
SUT SILK 2 0 (SUTURE) ×3
SUT SILK 2 0 SH CR/8 (SUTURE) ×2 IMPLANT
SUT SILK 2-0 18XBRD TIE 12 (SUTURE) IMPLANT
SUT SILK 3 0 (SUTURE) ×3
SUT SILK 3 0 SH CR/8 (SUTURE) ×3 IMPLANT
SUT SILK 3-0 18XBRD TIE 12 (SUTURE) ×1 IMPLANT
SUT V-LOC BARB 180 2/0GR6 GS22 (SUTURE) ×3
SUT VIC AB 3-0 SH 18 (SUTURE) ×6 IMPLANT
SUT VIC AB 3-0 SH 27 (SUTURE)
SUT VIC AB 3-0 SH 27XBRD (SUTURE) IMPLANT
SUT VICRYL 0 UR6 27IN ABS (SUTURE) ×3 IMPLANT
SUTURE V-LC BRB 180 2/0GR6GS22 (SUTURE) IMPLANT
SYR 10ML ECCENTRIC (SYRINGE) ×3 IMPLANT
SYS LAPSCP GELPORT 120MM (MISCELLANEOUS)
SYSTEM LAPSCP GELPORT 120MM (MISCELLANEOUS) IMPLANT
TAPE UMBILICAL COTTON 1/8X30 (MISCELLANEOUS) ×1 IMPLANT
TOWEL OR NON WOVEN STRL DISP B (DISPOSABLE) ×3 IMPLANT
TRAY FOLEY MTR SLVR 14FR STAT (SET/KITS/TRAYS/PACK) ×2 IMPLANT
TROCAR ADV FIXATION 5X100MM (TROCAR) ×3 IMPLANT
TUBING CONNECTING 10 (TUBING) ×6 IMPLANT
TUBING CONNECTING 10' (TUBING) ×4
TUBING INSUFFLATION 10FT LAP (TUBING) ×3 IMPLANT
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
## 2019-07-12 NOTE — Transfer of Care (Signed)
Immediate Anesthesia Transfer of Care Note  Patient: LEAHANN LEMPKE  Procedure(s) Performed: ROBOTIC ULTRA LOW ANTERIOR RECTOSIGMOID RESECTION, COLOILEAL ANASTOMOSIS, DIVERTING LOOP ILEOSTOMY, ILEOCECECTOMY, RIGID PROCTOSCOPY, BILATERAL TAP BLOCK (N/A Abdomen)  Patient Location: PACU  Anesthesia Type:General  Level of Consciousness: awake and patient cooperative  Airway & Oxygen Therapy: Patient Spontanous Breathing and Patient connected to face mask  Post-op Assessment: Report given to RN and Post -op Vital signs reviewed and stable  Post vital signs: Reviewed and stable  Last Vitals:  Vitals Value Taken Time  BP    Temp 36.5 C 07/12/19 1239  Pulse    Resp    SpO2      Last Pain:  Vitals:   07/12/19 0558  TempSrc: Oral         Complications: No apparent anesthesia complications

## 2019-07-12 NOTE — Progress Notes (Signed)
Medicated in PACU 45 minutes ago

## 2019-07-12 NOTE — Anesthesia Procedure Notes (Signed)
Procedure Name: Intubation Date/Time: 07/12/2019 7:50 AM Performed by: Claudia Desanctis, CRNA Pre-anesthesia Checklist: Patient identified, Emergency Drugs available, Suction available and Patient being monitored Patient Re-evaluated:Patient Re-evaluated prior to induction Oxygen Delivery Method: Circle system utilized Preoxygenation: Pre-oxygenation with 100% oxygen Induction Type: IV induction Ventilation: Mask ventilation without difficulty Laryngoscope Size: 2 and Miller Grade View: Grade I Tube type: Oral Tube size: 7.0 mm Number of attempts: 1 Airway Equipment and Method: Stylet Placement Confirmation: ETT inserted through vocal cords under direct vision,  positive ETCO2 and breath sounds checked- equal and bilateral Secured at: 20 cm Tube secured with: Tape Dental Injury: Teeth and Oropharynx as per pre-operative assessment

## 2019-07-12 NOTE — Progress Notes (Signed)
Pt made RN aware she had a near syncopal episode in the shower this morning. Pt states she has been very weak since starting a liquid diet. Pt was conscious when she feel today but lowered herself to the ground. She felt much better after drinking Ensure drink. EKG completed and CBG 111. Pt is alert and oriented at this time. Denies hitting her head.

## 2019-07-12 NOTE — Anesthesia Preprocedure Evaluation (Signed)
Anesthesia Evaluation  Patient identified by MRN, date of birth, ID band Patient awake    Reviewed: Allergy & Precautions, NPO status , Patient's Chart, lab work & pertinent test results  History of Anesthesia Complications Negative for: history of anesthetic complications  Airway Mallampati: II  TM Distance: >3 FB Neck ROM: Full    Dental  (+) Dental Advisory Given   Pulmonary neg shortness of breath, neg COPD, neg recent URI, former smoker,    breath sounds clear to auscultation       Cardiovascular hypertension, Pt. on medications (-) angina(-) Past MI and (-) CHF  Rhythm:Regular     Neuro/Psych PSYCHIATRIC DISORDERS Anxiety negative neurological ROS     GI/Hepatic Neg liver ROS, GERD  Medicated and Controlled,Colon ca   Endo/Other  Hypothyroidism   Renal/GU negative Renal ROS     Musculoskeletal negative musculoskeletal ROS (+)   Abdominal   Peds  Hematology  (+) Blood dyscrasia, anemia ,   Anesthesia Other Findings   Reproductive/Obstetrics                             Anesthesia Physical Anesthesia Plan  ASA: II  Anesthesia Plan: General   Post-op Pain Management:    Induction: Intravenous  PONV Risk Score and Plan: 3 and Ondansetron and Dexamethasone  Airway Management Planned: Oral ETT  Additional Equipment: None  Intra-op Plan:   Post-operative Plan: Extubation in OR  Informed Consent: I have reviewed the patients History and Physical, chart, labs and discussed the procedure including the risks, benefits and alternatives for the proposed anesthesia with the patient or authorized representative who has indicated his/her understanding and acceptance.       Plan Discussed with: CRNA and Surgeon  Anesthesia Plan Comments:         Anesthesia Quick Evaluation

## 2019-07-12 NOTE — Op Note (Signed)
07/12/2019  12:38 PM  PATIENT:  Kathleen Flores  73 y.o. 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  PROCEDURE:  ROBOTIC ULTRA LOW ANTERIOR RECTOSIGMOID RESECTION WITH COLOANAL ANASTOMOSIS DIVERTING LOOP ILEOSTOMY ILEOCECECTOMY OMENTAL PEDICLE FLAP ASSESSMENT OF TISSUE PERFUSION BY FIREFLY IMMUNOFLUORESENCE RIGID PROCTOSCOPY BILATERAL TAP BLOCK  SURGEON:  Adin Hector, MD  ASSISTANT: Nadeen Landau, MD An experienced assistant was required given the standard of surgical care given the complexity of the case.  This assistant was needed for exposure, dissection, suctioning, retraction, instrument exchange, etc.    ANESTHESIA:   General and Local anesthetic as a field block}  Nerve block provided with liposomal bupivacaine (Experel) mixed with 0.25% bupivacaine as a Bilateral TAP block x 43mL each side at the level of the transverse abdominis & preperitoneal spaces along the flank at the anterior axillary line, from subcostal ridge to iliac crest under laparoscopic guidance   EBL:  Total I/O In: 2000 [I.V.:2000] Out: 500 [Urine:450; Blood:50] "see anesthesia record"  Delay start of Pharmacological VTE agent (>24hrs) due to surgical blood loss or risk of bleeding:  no  DRAINS: 19 Fr Blake drain in the pelvis   SPECIMEN:  Source of Specimen:  ILEOCECAL REGION and "RECTOSIGMOID COLON DISTAL ANASTOMOTIC RING  DISPOSITION OF SPECIMEN:  PATHOLOGY  COUNTS:  YES  PLAN OF CARE: Admit to inpatient   PATIENT DISPOSITION:  PACU -  hemodynamically stable.  INDICATION:    Pleasant woman found to have bulky tumor in mid/lower rectum.  Underwent chemoradiation therapy.  Developed partial small bowel obstruction due to ileal thickening suspicious for radiation ileitis stricture.  I recommended segmental resection:  The anatomy & physiology of the digestive tract was discussed.  The pathophysiology was discussed.  Natural history risks without surgery was discussed.   I worked to give an overview of the disease and the frequent need to have multispecialty involvement.  I feel the risks of no intervention will lead to serious problems that outweigh the operative risks; therefore, I recommended a partial colectomy to remove the pathology.  Laparoscopic & open techniques were discussed.   Risks such as bleeding, infection, abscess, leak, reoperation, possible ostomy, hernia, heart attack, death, and other risks were discussed.  I noted a good likelihood this will help address the problem.   Goals of post-operative recovery were discussed as well.  We will work to minimize complications.  Educational materials on the pathology had been given in the office.  Questions were answered.    The patient expressed understanding & wished to proceed with surgery.  OR FINDINGS:   Patient had persistent bulky scarring tumor at the mid/low rectal junction.  No obvious metastatic disease on visceral parietal peritoneum or liver.  The anastomosis rests 1-2 cm from the anal verge by rigid proctoscopy.  It is a side colon to end 31 coloanal stapled anastomosis.  Ileal thickening x15 cm very close to the terminal ileum.  Ileocecectomy done  Diverting loop ileostomy proximal to be colonic anastomosis.  Proximal end cephalad  PROCEDURE:  Informed consent was confirmed.  The patient underwent general anaesthesia without difficulty.  The patient  was positioned appropriately.  VTE prevention in place.  The patient's abdomen was clipped, prepped, &  draped in a sterile fashion.  Surgical timeout confirmed our plan.  The patient was positioned in reverse Trendelenburg.  Abdominal entry was gained using optical entry technique in the  right upper abdomen.  Entry was clean.  I induced carbon dioxide insufflation.  Camera inspection revealed no injury.  Extra ports were carefully placed under direct laparoscopic visualization.  I reflected the greater omentum and the upper abdomen the small bowel in the upper abdomen.  The patient was carefully positioned.  The Intuitive daVinci robot was carefully docked with camera & instruments carefully placed.  The patient had No evidence of visceral or parietal peritoneal metastatic disease.  No evidence of metastasis.   The terminal ileum was obviously thickened and there was some proximal small bowel dilatation consistent with her prior CAT scan implying partial small bowel obstruction.  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  I focused on rectosigmoid resection.  I scored the base of peritoneum of the medial side of the mesentery of the left colon from the ligament of Treitz to the peritoneal reflection of the mid rectum.   I elevated the sigmoid mesentery and entered into the retro-mesenteric plane. We were able to identify the left ureter and gonadal vessels. We kept those posterior within the retroperitoneum and elevated the left colon mesentery off that. I did isolated IMA pedicle but did not ligate it yet.  I continued distally and got into the avascular plane posterior to the mesorectum. This allowed me to help mobilize the rectum as well by freeing the mesorectum off the sacrum.  I mobilized the peritoneal coverings towards the peritoneal reflection on both the right and left sides of the rectum.  I stayed away from the right and left ureters.  I kept the lateral vascular pedicles to the rectum intact.  I dissected around inferior mesenteric arterial and venous pedicles.  After getting  good skeletonization I did a high ligation using the robotic vessel sealer to good result.  Mobilized the left colon mesentery off the retroperitoneum.  Kidney 1 to come up with it as well but gradually able to help free to keep it in the retroperitoneal position.  Because she had rather redundant stretched out sigmoid colon, held off on splenic flexure mobilization.  Proceed with total mesorectal excision.  Gradually exposed the rectum and freed it off its attachments to the pelvis.  Continued on the presacral plane down to the pelvic floor.  Repositioned anteriorly.  I was able to score around the peritoneal reflection which was rather low.  She had obvious radiation change and inflammation.  The anterior rectal wall off the rectovaginal septum to find tattooing at the low rectum.  Digital rectal examination to confirm the distal scarring in the low rectum.  Further skeletonization in anticipation of resecting at the low rectum. He around the posterior was very thin-walled poor quality.  I decided transected at the low rectum to preserve a decent rectal cuff.  With that I could eviscerate the rectosigmoid out of the pelvis.  We will that clamped and elevated.  I did a pursestring stretch around the remaining anal rectal stump with a 2-0 V lock running suture.  Then chose an area in the descending colon that usually reach down to the very low pelvis.  I transected the mesentery radially, including the inferior mesenteric venous arterial pedicles to be part of the specimen.  We asked  anesthesia to dilute the indocyanine green (ICG) to 10 mL and inject 3 mL intravenously with IV flush.  I switched to the NIR fluorescence (Firefly mode) imaging window on the daVinci platform.  I was able to see good light green visualization of blood vessels with good perfusion of tissues, confirming good tissue perfusion of both ends planned for anastomosis.  Stapled off green load robotic stapler at the descending/sigmoid  junction where it reached well.  I then mobilized the terminal ileum and cecum in a lateral medial fashion to help make sure he could mobilize for extracorporeal extraction and inspection.  Created a Pfannenstiel type extraction incision in the right upper quadrant premarked ileostomy site.  Placed wound protector.  Eviscerated the specimen to confirm good distal margins and good high ligation.  Sent for pathology.  Open and is actually distal.  I made a colotomy 6 cm proximal to the staple line and placed a 31 EEA stapler anvil into it.  Made a pursestring around it to tied that down.  Returned out into the pelvis.  History of the ileocecal region.  He did have an open fashion a little bit to do that.  Terminated was obviously very thickened with a minimally palpable lumen.  Because of its persistent thickening per quite some time the patient essentially having to be on a blenderized liquid diet, I felt it was not a good idea to leave that in place.  Therefore I did an ileal cecal resection.  Stapled off distal ileum to mid ascending colon side to side fashion.  Stapled off the common defect with a TX 90.  Transected the mesentery with clamps and silk ties.  Close mesenteric defect plane over the TX staple line.  Barcelona style anastomosis.  Specimen sent off.  Placed silk stitches on the soft non-radiated ileum proximal out in anticipation of loop ileostomy.  Brought up the greater omentum and transected it off the right colon to the mid transverse colon to have a nice long omental pedicle flap that can easily reach down the pelvis.  Dr. Dema Severin scrubbed down and did gentle anal dilatation.  He brought the spike of the EEA stapler about the center of the pursestring rectal stump.  Attached the anvil to that he tied down.  Held the clamp for 90 seconds.  Fired.  Held for 30 seconds.  Released.  2 good anastomotic rings.  The distal ring sent for the final distal margin.  He conditionally palpate the  anastomosis that was very low.  Closest posterior midline.  He did not feel any defects.  I placed antibiotic irrigation.  He insufflated with a rigid proctoscopy and there was no evidence of any air leak.  There is no active bleeding.  Consistent with an airtight anastomosis.  I brought the greater omentum down to help fill up the pelvis.  I placed a drain as noted above around around the very low anastomosis.  The retroperitoneum was evacuated.  While I brought up the distal ileum proximal to the prior ileocecal resection anticipation diverting loop ileostomy.  Good mobility.  Protected the ports removed.  I changed gloves.  And closed the port sites with 0 Vicryl on the suprapubic stapler port site fascia.  Skin closed with 4-0 Monocryl.  Sterile dressings applied.  I tied down the fascia around the right upper quadrant premarked ileostomy region using interrupted #1 PDS at the corners until it was more snug.  I then created a loop ileostomy with the proximal end  cephalad at 12:00 to have a smaller distal end is a pseudo-mucous fistula.  With 3-0 Vicryl interrupted sutures to get a good 2 cm Brooke ileostomy.  Did close the lateral corner of the wound down a little bit to have more of a circular ostomy defect.  Appliance placed.  Patient is being extubated go to recovery room. I discussed postop care with the patient in detail the office & in the holding area. Instructions are written. I updated the status of the patient to the patient's spouse.  I made recommendations.  I answered questions.  Understanding & appreciation was expressed.  Adin Hector, M.D., F.A.C.S. Gastrointestinal and Minimally Invasive Surgery [facility] [address], [place], [place] [id number] [phone] Main / Paging

## 2019-07-12 NOTE — Anesthesia Postprocedure Evaluation (Signed)
Anesthesia Post Note  Patient: Kathleen Flores  Procedure(s) Performed: ROBOTIC ULTRA LOW ANTERIOR RECTOSIGMOID RESECTION, COLOILEAL ANASTOMOSIS, DIVERTING LOOP ILEOSTOMY, ILEOCECECTOMY, RIGID PROCTOSCOPY, BILATERAL TAP BLOCK (N/A Abdomen)     Patient location during evaluation: PACU Anesthesia Type: General Level of consciousness: awake and alert Pain management: pain level controlled Vital Signs Assessment: post-procedure vital signs reviewed and stable Respiratory status: spontaneous breathing, nonlabored ventilation, respiratory function stable and patient connected to nasal cannula oxygen Cardiovascular status: blood pressure returned to baseline and stable Postop Assessment: no apparent nausea or vomiting Anesthetic complications: no    Last Vitals:  Vitals:   07/12/19 1400 07/12/19 1500  BP: (!) 98/46 (!) 112/54  Pulse: 62 62  Resp: (!) 6 11  Temp: (!) 36.4 C   SpO2: 96% 100%    Last Pain:  Vitals:   07/12/19 1500  TempSrc:   PainSc: Asleep                 Emmamae Mcnamara

## 2019-07-13 ENCOUNTER — Encounter (HOSPITAL_COMMUNITY): Payer: Self-pay | Admitting: Surgery

## 2019-07-13 DIAGNOSIS — E876 Hypokalemia: Secondary | ICD-10-CM

## 2019-07-13 LAB — CBC
HCT: 28.3 % — ABNORMAL LOW (ref 36.0–46.0)
Hemoglobin: 9.3 g/dL — ABNORMAL LOW (ref 12.0–15.0)
MCH: 32 pg (ref 26.0–34.0)
MCHC: 32.9 g/dL (ref 30.0–36.0)
MCV: 97.3 fL (ref 80.0–100.0)
Platelets: 175 10*3/uL (ref 150–400)
RBC: 2.91 MIL/uL — ABNORMAL LOW (ref 3.87–5.11)
RDW: 11.9 % (ref 11.5–15.5)
WBC: 4.9 10*3/uL (ref 4.0–10.5)
nRBC: 0 % (ref 0.0–0.2)

## 2019-07-13 LAB — BASIC METABOLIC PANEL
Anion gap: 7 (ref 5–15)
BUN: 5 mg/dL — ABNORMAL LOW (ref 8–23)
CO2: 28 mmol/L (ref 22–32)
Calcium: 8.2 mg/dL — ABNORMAL LOW (ref 8.9–10.3)
Chloride: 102 mmol/L (ref 98–111)
Creatinine, Ser: 0.5 mg/dL (ref 0.44–1.00)
GFR calc Af Amer: >60 mL/min (ref >60)
GFR calc non Af Amer: >60 mL/min (ref >60)
Glucose, Bld: 125 mg/dL — ABNORMAL HIGH (ref 70–99)
Potassium: 3.5 mmol/L (ref 3.5–5.1)
Sodium: 137 mmol/L (ref 135–145)

## 2019-07-13 LAB — MAGNESIUM: Magnesium: 1.6 mg/dL — ABNORMAL LOW (ref 1.7–2.4)

## 2019-07-13 MED ORDER — MAGNESIUM SULFATE 2 GM/50ML IV SOLN
2.0000 g | Freq: Once | INTRAVENOUS | Status: AC
  Administered 2019-07-13: 2 g via INTRAVENOUS
  Filled 2019-07-13: qty 50

## 2019-07-13 MED ORDER — SODIUM CHLORIDE 0.9% FLUSH: 3.0000 mL | INTRAVENOUS | Status: DC | PRN

## 2019-07-13 MED ORDER — POTASSIUM CHLORIDE CRYS ER 20 MEQ PO TBCR
40.0000 meq | EXTENDED_RELEASE_TABLET | Freq: Two times a day (BID) | ORAL | Status: AC
  Administered 2019-07-13 – 2019-07-15 (×6): 40 meq via ORAL
  Filled 2019-07-13 (×6): qty 2

## 2019-07-13 MED ORDER — SODIUM CHLORIDE 0.9 % IV SOLN: 250.0000 mL | INTRAVENOUS | Status: DC | PRN

## 2019-07-13 MED ORDER — SODIUM CHLORIDE 0.9% FLUSH
3.0000 mL | Freq: Two times a day (BID) | INTRAVENOUS | Status: DC
  Administered 2019-07-13 – 2019-07-15 (×4): 3 mL via INTRAVENOUS

## 2019-07-13 NOTE — Progress Notes (Addendum)
Kathleen Flores [id number] [birth year]  CARE TEAM:  PCP: [name], FNP  Outpatient Care Team: Patient Care Team: [name], [place] as PCP - General (Nurse Practitioner) Michael Boston, MD as Consulting Physician (General Surgery) Milus Banister, MD as Consulting Physician (Gastroenterology) Arna Snipe, RN as Oncology Nurse Navigator Ladell Pier, MD as Consulting Physician (Oncology)  Inpatient Treatment Team: Treatment Team: Attending Provider: Michael Boston, MD; Technician: Leda Quail, NT   Problem List:   Principal Problem:   Rectal cancer status post robotic ultralow rectosigmoid resection with diverting loop ileostomy 07/12/2019 Active Problems:   Acquired hypothyroidism   Essential hypertension   Mixed hyperlipidemia   Ileostomy in place [facility])   Anxiety state   Hypokalemia   Hypomagnesemia   1 Day Post-Op  07/12/2019  POST-OPERATIVE DIAGNOSIS:   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  PROCEDURE:  ROBOTIC ULTRA LOW ANTERIOR RECTOSIGMOID RESECTION WITH COLOANAL ANASTOMOSIS DIVERTING LOOP ILEOSTOMY ILEOCECECTOMY OMENTAL PEDICLE FLAP ASSESSMENT OF TISSUE PERFUSION BY FIREFLY IMMUNOFLUORESENCE RIGID PROCTOSCOPY BILATERAL TAP BLOCK  SURGEON:  Adin Hector, MD  OR FINDINGS:   Patient had persistent bulky scarring tumor at the mid/low rectal junction.  No obvious metastatic disease on visceral parietal peritoneum or liver.  The anastomosis rests 1-2 cm from the anal verge by rigid proctoscopy.  It is a side colon to end 31 coloanal stapled anastomosis.  Ileal thickening x15 cm very close to the terminal ileum.  Ileocecectomy done  Diverting loop ileostomy proximal to be colonic anastomosis.  Proximal end cephalad   Assessment  Recovering relatively well so far.  [facility] Stay = 1 days)  Plan:  -ERAS protocol -We have oxygen.  DC Foley.  I&O  cath PRN.  Stop IV fluids.  Hypokalemia: Correct.  Hypomagnesemia: Corrected.  Anxiolysis.  Ileostomy care.  Discussed in already seen by the wound ostomy team.  Follow her ileostomy output.  If it looks like it is going to be high output ileostomy, place PICC line with IV fluids at the infusion center Monday Wednesday Friday x6 weeks.  If not to bed, see if we can control with antidiarrheals.  Psyllium fiber regimen to help thicken up stools.  Hypertension.  Cozaar with PRN backup  Hypothyroidism.  Levothyroxine  -VTE prophylaxis- SCDs, etc  -mobilize as tolerated to help recovery  30 minutes spent in review, evaluation, examination, counseling, and coordination of care.  More than 50% of that time was spent in counseling.  I discussed operative findings, updated the patient's status, discussed probable steps to recovery, and gave postoperative recommendations to the patient.  Recommendations were made.  Questions were answered.  She expressed understanding & appreciation.   07/13/2019    Subjective: (Chief complaint)  Pain not bad.  Already having flatus and liquid output in her ileostomy.  Walked in hallways last night.  Seen by wound ostomy nurse.  Objective:  Vital signs:  Vitals:   07/12/19 2000 07/12/19 2041 07/13/19 0203 07/13/19 0537  BP: (!) 100/50 (!) 96/47 (!) 103/46 (!) 123/51  Pulse:  75 78 85  Resp:  18 18 18   Temp:  97.8 F (36.6 C) 97.6 F (36.4 C) 98.4 F (36.9 C)  TempSrc:  Oral Oral Oral  SpO2:  100% 99% 100%  Weight:    65 kg  Height:           Intake/Output   Yesterday:  07/30 0701 - 07/31 0700 In: 3506.5 [P.O.:480; I.V.:2926.5; IV  Piggyback:100] Out: 7867 [Urine:1250; Drains:280; Stool:200; Blood:50] This shift:  Total I/O In: 672 [P.O.:480; I.V.:352] Out: 940 [Urine:650; Drains:90; Stool:200]  Bowel function:  Flatus: YES  BM:  YES  Drain: Serosanguinous   Physical Exam:  General: Pt awake/alert/oriented x4 in no  acute distress Eyes: PERRL, normal EOM.  Sclera clear.  No icterus Neuro: CN II-XII intact w/o focal sensory/motor deficits. Lymph: No head/neck/groin lymphadenopathy Psych:  No delerium/psychosis/paranoia.  A little anxious and depressed but consolable. HENT: Normocephalic, Mucus membranes moist.  No thrush Neck: Supple, No tracheal deviation Chest: No chest wall pain w good excursion CV:  Pulses intact.  Regular rhythm MS: Normal AROM mjr joints.  No obvious deformity  Abdomen: Soft.  Mildy distended.  Mildly tender at incisions only.  Right upper quadrant loop ileostomy pink with gas and bilious thin succus in bag.  No evidence of peritonitis.  No incarcerated hernias.  Ext:  No deformity.  No mjr edema.  No cyanosis Skin: No petechiae / purpura  Results:   Cultures: Recent Results (from the past 720 hour(s))  SARS Coronavirus 2 (Performed in [place] hospital lab)     Status: None   Collection Time: 06/15/19  9:59 AM   Specimen: Nasal Swab  Result Value Ref Range Status   SARS Coronavirus 2 NEGATIVE NEGATIVE Final    Comment: (NOTE) SARS-CoV-2 target nucleic acids are NOT DETECTED. The SARS-CoV-2 RNA is generally detectable in upper and lower respiratory specimens during the acute phase of infection. Negative results do not preclude SARS-CoV-2 infection, do not rule out co-infections with other pathogens, and should not be used as the sole basis for treatment or other patient management decisions. Negative results must be combined with clinical observations, patient history, and epidemiological information. The expected result is Negative. Fact Sheet for Patients: SugarRoll.be Fact Sheet for Healthcare Providers: https://www.woods-mathews.com/ This test is not yet approved or cleared by the [place] FDA and  has been authorized for detection and/or diagnosis of SARS-CoV-2 by FDA under an Emergency Use Authorization (EUA).  This EUA will remain  in effect (meaning this test can be used) for the duration of the COVID-19 declaration under Section 56 4(b)(1) of the Act, 21 U.S.C. section 360bbb-3(b)(1), unless the authorization is terminated or revoked sooner. Performed at [facility], [place] [address], [place], [place] [address]   SARS Coronavirus 2 (Performed in [facility] hospital lab)     Status: None   Collection Time: 07/09/19  2:15 PM   Specimen: Nasal Swab  Result Value Ref Range Status   SARS Coronavirus 2 NEGATIVE NEGATIVE Final    Comment: (NOTE) SARS-CoV-2 target nucleic acids are NOT DETECTED. The SARS-CoV-2 RNA is generally detectable in upper and lower respiratory specimens during the acute phase of infection. Negative results do not preclude SARS-CoV-2 infection, do not rule out co-infections with other pathogens, and should not be used as the sole basis for treatment or other patient management decisions. Negative results must be combined with clinical observations, patient history, and epidemiological information. The expected result is Negative. Fact Sheet for Patients: SugarRoll.be Fact Sheet for Healthcare Providers: https://www.woods-mathews.com/ This test is not yet approved or cleared by the [place] FDA and  has been authorized for detection and/or diagnosis of SARS-CoV-2 by FDA under an Emergency Use Authorization (EUA). This EUA will remain  in effect (meaning this test can be used) for the duration of the COVID-19 declaration under Section 56 4(b)(1) of the Act, 21 U.S.C. section 360bbb-3(b)(1), unless  the authorization is terminated or revoked sooner. Performed at [facility], [place] [address] [place], [place] [address]     Labs: Results for orders placed or performed during the hospital encounter of 07/12/19 (from the past 48 hour(s))  Glucose, capillary     Status: Abnormal   Collection Time: 07/12/19  5:48  AM  Result Value Ref Range   Glucose-Capillary 111 (H) 70 - 99 mg/dL  Basic metabolic panel     Status: Abnormal   Collection Time: 07/13/19  3:45 AM  Result Value Ref Range   Sodium 137 135 - 145 mmol/L   Potassium 3.5 3.5 - 5.1 mmol/L   Chloride 102 98 - 111 mmol/L   CO2 28 22 - 32 mmol/L   Glucose, Bld 125 (H) 70 - 99 mg/dL   BUN 5 (L) 8 - 23 mg/dL   Creatinine, Ser 0.50 0.44 - 1.00 mg/dL   Calcium 8.2 (L) 8.9 - 10.3 mg/dL   GFR calc non Af Amer >60 >60 mL/min   GFR calc Af Amer >60 >60 mL/min   Anion gap 7 5 - 15    Comment: Performed at [facility], [place] [address] [place], [place] [address]  CBC     Status: Abnormal   Collection Time: 07/13/19  3:45 AM  Result Value Ref Range   WBC 4.9 4.0 - 10.5 K/uL   RBC 2.91 (L) 3.87 - 5.11 MIL/uL   Hemoglobin 9.3 (L) 12.0 - 15.0 g/dL   HCT 28.3 (L) 36.0 - 46.0 %   MCV 97.3 80.0 - 100.0 fL   MCH 32.0 26.0 - 34.0 pg   MCHC 32.9 30.0 - 36.0 g/dL   RDW 11.9 11.5 - 15.5 %   Platelets 175 150 - 400 K/uL   nRBC 0.0 0.0 - 0.2 %    Comment: Performed at [facility], [place] [address], [place], [place] [address]  Magnesium     Status: Abnormal   Collection Time: 07/13/19  3:45 AM  Result Value Ref Range   Magnesium 1.6 (L) 1.7 - 2.4 mg/dL    Comment: Performed at [facility], [place] [address], [place], [place] [address]    Imaging / Studies: No results found.  Medications / Allergies: per chart  Antibiotics: Anti-infectives (From admission, onward)   Start     Dose/Rate Route Frequency Ordered Stop   07/12/19 1800  cefoTEtan (CEFOTAN) 2 g in sodium chloride 0.9 % 100 mL IVPB     2 g 200 mL/hr over 30 Minutes Intravenous Every 12 hours 07/12/19 1636 07/12/19 1835   07/12/19 1400  neomycin (MYCIFRADIN) tablet 1,000 mg  Status:  Discontinued     1,000 mg Oral 3 times per day 07/12/19 0535 07/12/19 0537   07/12/19 1400  metroNIDAZOLE (FLAGYL) tablet 1,000 mg  Status:  Discontinued      1,000 mg Oral 3 times per day 07/12/19 0535 07/12/19 0537   07/12/19 1127  clindamycin (CLEOCIN) 900 mg, gentamicin (GARAMYCIN) 240 mg in sodium chloride 0.9 % 1,000 mL for intraperitoneal lavage  Status:  Discontinued       As needed 07/12/19 1128 07/12/19 1233   07/12/19 0600  clindamycin (CLEOCIN) 900 mg, gentamicin (GARAMYCIN) 240 mg in sodium chloride 0.9 % 1,000 mL for intraperitoneal lavage  Status:  Discontinued      Irrigation To Surgery 07/11/19 1005 07/12/19 1612   07/12/19 0600  cefoTEtan (CEFOTAN) 2 g in sodium chloride 0.9 % 100 mL  IVPB     2 g 200 mL/hr over 30 Minutes Intravenous On call to O.R. 07/12/19 0535 07/12/19 0751        Note: Portions of this report may have been transcribed using voice recognition software. Every effort was made to ensure accuracy; however, inadvertent computerized transcription errors may be present.   Any transcriptional errors that result from this process are unintentional.     Adin Hector, MD, FACS, MASCRS Gastrointestinal and Minimally Invasive Surgery    [address] [place], [place] [address] [phone] Main / Paging [phone] Fax

## 2019-07-13 NOTE — Consult Note (Addendum)
[facility] Nurse ostomy consult note Pt had ileostomy surgery performed on 7/30. Stoma type/location:  Stoma is red and viable, slightly above skin level, 1 1/2 inches.  Current pouch is leaking behind the barrier. Peristomal assessment: Intact skin surrounding Output: 50 cc brown liquid  Ostomy pouching:  2pc.  Education provided: Demonstrated pouch change using barrier ring to attempt to maintain a seal, and 2 piece pouching system.  Pt assisted and asked appropriate questions.  Pt was able to open and close velcro without assistance.  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  Discussed dietary precautions and importance of avoiding dehydration.  Reviewed pouching routines and ordering supplies. Pt could benefit from home health assistance after discharge.  Enrolled patient in [employer] program: Yes [place] team will see again on Mon for another teaching session.   Julien Girt MSN, RN, [place], [place], [place]

## 2019-07-14 MED ORDER — PSYLLIUM 95 % PO PACK
1.0000 | PACK | Freq: Three times a day (TID) | ORAL | Status: DC
  Administered 2019-07-14 – 2019-07-15 (×6): 1 via ORAL
  Filled 2019-07-14 (×7): qty 1

## 2019-07-14 NOTE — Progress Notes (Signed)
Patient independently emptied ostomy and cleaned up.

## 2019-07-14 NOTE — Progress Notes (Signed)
Progress Note: General Surgery Service   Assessment/Plan: Principal Problem:   Rectal cancer status post robotic ultralow rectosigmoid resection with diverting loop ileostomy 07/12/2019 Active Problems:   Acquired hypothyroidism   Essential hypertension   Mixed hyperlipidemia   Ileostomy in place [facility])   Anxiety state   Hypokalemia   Hypomagnesemia  s/p Procedure(s): ROBOTIC ULTRA LOW ANTERIOR RECTOSIGMOID RESECTION, COLOILEAL ANASTOMOSIS, DIVERTING LOOP ILEOSTOMY, ILEOCECECTOMY, RIGID PROCTOSCOPY, BILATERAL TAP BLOCK 07/12/2019 -increase psyllium frequency -continue to monitor oxygen -strict I/O and ileostomy care FEN- will try reg diet today VTE- SCDs, lovenox ID-  Perioperative abx, no signs of infection Dispo- home when ileostomy output stabilized   LOS: 2 days  Chief Complaint/Subjective: Tolerating some food, dysphagia diet doesn't taste good, pain controlled, some green rectal drainage, concern for UTI due to drainage, no dysuria or pelvic pain. Wants heating pad for back due to shower fall morning of surgery  Objective: Vital signs in last 24 hours: Temp:  [98.6 F (37 C)-99.1 F (37.3 C)] 99.1 F (37.3 C) (08/01 0516) Pulse Rate:  [77-108] 86 (08/01 0516) Resp:  [14-19] 19 (08/01 0516) BP: (98-133)/(42-58) 133/55 (08/01 0516) SpO2:  [91 %-99 %] 91 % (08/01 0516) Weight:  [65.2 kg] 65.2 kg (08/01 0516)    Intake/Output from previous day: 07/31 0701 - 08/01 0700 In: 320 [P.O.:320] Out: 2465 [Urine:1500; Drains:115; Stool:850] Intake/Output this shift: Total I/O In: -  Out: 200 [Urine:200]  Lungs: nonlabored  Cardiovascular: RRR  Abd: soft, ATTP, ileostomy with liquid output  Extremities: no edema  Neuro: AOx4  Lab Results: CBC  Recent Labs    07/13/19 0345  WBC 4.9  HGB 9.3*  HCT 28.3*  PLT 175   BMET Recent Labs    07/13/19 0345  NA 137  K 3.5  CL 102  CO2 28  GLUCOSE 125*  BUN 5*  CREATININE 0.50  CALCIUM 8.2*   PT/INR No  results for input(s): LABPROT, INR in the last 72 hours. ABG No results for input(s): PHART, HCO3 in the last 72 hours.  Invalid input(s): PCO2, PO2  Studies/Results:  Anti-infectives: Anti-infectives (From admission, onward)   Start     Dose/Rate Route Frequency Ordered Stop   07/12/19 1800  cefoTEtan (CEFOTAN) 2 g in sodium chloride 0.9 % 100 mL IVPB     2 g 200 mL/hr over 30 Minutes Intravenous Every 12 hours 07/12/19 1636 07/12/19 1835   07/12/19 1400  neomycin (MYCIFRADIN) tablet 1,000 mg  Status:  Discontinued     1,000 mg Oral 3 times per day 07/12/19 0535 07/12/19 0537   07/12/19 1400  metroNIDAZOLE (FLAGYL) tablet 1,000 mg  Status:  Discontinued     1,000 mg Oral 3 times per day 07/12/19 0535 07/12/19 0537   07/12/19 1127  clindamycin (CLEOCIN) 900 mg, gentamicin (GARAMYCIN) 240 mg in sodium chloride 0.9 % 1,000 mL for intraperitoneal lavage  Status:  Discontinued       As needed 07/12/19 1128 07/12/19 1233   07/12/19 0600  clindamycin (CLEOCIN) 900 mg, gentamicin (GARAMYCIN) 240 mg in sodium chloride 0.9 % 1,000 mL for intraperitoneal lavage  Status:  Discontinued      Irrigation To Surgery 07/11/19 1005 07/12/19 1612   07/12/19 0600  cefoTEtan (CEFOTAN) 2 g in sodium chloride 0.9 % 100 mL IVPB     2 g 200 mL/hr over 30 Minutes Intravenous On call to O.R. 07/12/19 0535 07/12/19 0751      Medications: Scheduled Meds: . acetaminophen  1,000 mg Oral Q6H  .  alvimopan  12 mg Oral BID  . amLODipine  5 mg Oral Daily  . cholecalciferol  5,000 Units Oral Daily  . enoxaparin (LOVENOX) injection  40 mg Subcutaneous Q24H  . feeding supplement  237 mL Oral BID BM  . gabapentin  200 mg Oral TID  . levothyroxine  125 mcg Oral Q0600  . lip balm  1 application Topical BID  . losartan  100 mg Oral Daily  . magnesium oxide  400 mg Oral Daily  . potassium chloride  40 mEq Oral BID  . psyllium  1 packet Oral TID  . saccharomyces boulardii  250 mg Oral BID  . sodium chloride flush  3  mL Intravenous Q12H  . vitamin B-12  5,000 mcg Oral Daily  . vitamin C  1,000 mg Oral Daily   Continuous Infusions: . sodium chloride    . sodium chloride     PRN Meds:.sodium chloride, sodium chloride, ALPRAZolam, alum & mag hydroxide-simeth, diphenhydrAMINE **OR** diphenhydrAMINE, hydrALAZINE, HYDROmorphone (DILAUDID) injection, hyoscyamine, magic mouthwash, metoprolol tartrate, ondansetron **OR** ondansetron (ZOFRAN) IV, prochlorperazine **OR** prochlorperazine, sodium chloride flush, traMADol  Mickeal Skinner, MD [facility]

## 2019-07-15 MED ORDER — LOPERAMIDE HCL 2 MG PO CAPS
2.0000 mg | ORAL_CAPSULE | Freq: Three times a day (TID) | ORAL | Status: DC
  Administered 2019-07-15 – 2019-07-16 (×3): 2 mg via ORAL
  Filled 2019-07-15 (×3): qty 1

## 2019-07-15 NOTE — Progress Notes (Signed)
Progress Note: General Surgery Service   Assessment/Plan: Principal Problem:   Rectal cancer status post robotic ultralow rectosigmoid resection with diverting loop ileostomy 07/12/2019 Active Problems:   Acquired hypothyroidism   Essential hypertension   Mixed hyperlipidemia   Ileostomy in place [facility])   Anxiety state   Hypokalemia   Hypomagnesemia  s/p Procedure(s): ROBOTIC ULTRA LOW ANTERIOR RECTOSIGMOID RESECTION, COLOILEAL ANASTOMOSIS, DIVERTING LOOP ILEOSTOMY, ILEOCECECTOMY, RIGID PROCTOSCOPY, BILATERAL TAP BLOCK 07/12/2019 -high output ileostomy yesterday, add imodium today -continue fiber -continue current diet    LOS: 3 days  Chief Complaint/Subjective: Drinking >1l water, small amount food intake, pain minimal  Objective: Vital signs in last 24 hours: Temp:  [98 F (36.7 C)-98.6 F (37 C)] 98.2 F (36.8 C) (08/02 0529) Pulse Rate:  [76-90] 76 (08/02 0529) Resp:  [18-19] 19 (08/02 0529) BP: (94-115)/(55-57) 105/55 (08/02 0529) SpO2:  [92 %-96 %] 96 % (08/02 0529) Weight:  [68.2 kg] 68.2 kg (08/02 0529) Last BM Date: 07/15/19  Intake/Output from previous day: 08/01 0701 - 08/02 0700 In: 97 [P.O.:720] Out: 3485 [Urine:800; Drains:135; Stool:2550] Intake/Output this shift: Total I/O In: -  Out: 200 [Stool:200]  Lungs: nonlabored  Cardiovascular: RRR  Abd: soft, ostomy pink with liquid stool, incisions c/d/i, minimal tenderness  Extremities: no edema  Neuro: AOx4  Lab Results: CBC  Recent Labs    07/13/19 0345  WBC 4.9  HGB 9.3*  HCT 28.3*  PLT 175   BMET Recent Labs    07/13/19 0345  NA 137  K 3.5  CL 102  CO2 28  GLUCOSE 125*  BUN 5*  CREATININE 0.50  CALCIUM 8.2*   PT/INR No results for input(s): LABPROT, INR in the last 72 hours. ABG No results for input(s): PHART, HCO3 in the last 72 hours.  Invalid input(s): PCO2, PO2  Studies/Results:  Anti-infectives: Anti-infectives (From admission, onward)   Start     Dose/Rate  Route Frequency Ordered Stop   07/12/19 1800  cefoTEtan (CEFOTAN) 2 g in sodium chloride 0.9 % 100 mL IVPB     2 g 200 mL/hr over 30 Minutes Intravenous Every 12 hours 07/12/19 1636 07/12/19 1835   07/12/19 1400  neomycin (MYCIFRADIN) tablet 1,000 mg  Status:  Discontinued     1,000 mg Oral 3 times per day 07/12/19 0535 07/12/19 0537   07/12/19 1400  metroNIDAZOLE (FLAGYL) tablet 1,000 mg  Status:  Discontinued     1,000 mg Oral 3 times per day 07/12/19 0535 07/12/19 0537   07/12/19 1127  clindamycin (CLEOCIN) 900 mg, gentamicin (GARAMYCIN) 240 mg in sodium chloride 0.9 % 1,000 mL for intraperitoneal lavage  Status:  Discontinued       As needed 07/12/19 1128 07/12/19 1233   07/12/19 0600  clindamycin (CLEOCIN) 900 mg, gentamicin (GARAMYCIN) 240 mg in sodium chloride 0.9 % 1,000 mL for intraperitoneal lavage  Status:  Discontinued      Irrigation To Surgery 07/11/19 1005 07/12/19 1612   07/12/19 0600  cefoTEtan (CEFOTAN) 2 g in sodium chloride 0.9 % 100 mL IVPB     2 g 200 mL/hr over 30 Minutes Intravenous On call to O.R. 07/12/19 0535 07/12/19 0751      Medications: Scheduled Meds: . acetaminophen  1,000 mg Oral Q6H  . amLODipine  5 mg Oral Daily  . cholecalciferol  5,000 Units Oral Daily  . enoxaparin (LOVENOX) injection  40 mg Subcutaneous Q24H  . feeding supplement  237 mL Oral BID BM  . gabapentin  200 mg Oral TID  .  levothyroxine  125 mcg Oral Q0600  . lip balm  1 application Topical BID  . loperamide  2 mg Oral Q8H  . losartan  100 mg Oral Daily  . magnesium oxide  400 mg Oral Daily  . potassium chloride  40 mEq Oral BID  . psyllium  1 packet Oral TID  . saccharomyces boulardii  250 mg Oral BID  . sodium chloride flush  3 mL Intravenous Q12H  . vitamin B-12  5,000 mcg Oral Daily  . vitamin C  1,000 mg Oral Daily   Continuous Infusions: . sodium chloride     PRN Meds:.sodium chloride, ALPRAZolam, alum & mag hydroxide-simeth, diphenhydrAMINE **OR** diphenhydrAMINE,  hydrALAZINE, HYDROmorphone (DILAUDID) injection, hyoscyamine, magic mouthwash, metoprolol tartrate, ondansetron **OR** ondansetron (ZOFRAN) IV, prochlorperazine **OR** prochlorperazine, sodium chloride flush, traMADol  Mickeal Skinner, MD [facility]

## 2019-07-16 ENCOUNTER — Inpatient Hospital Stay: Payer: Self-pay

## 2019-07-16 ENCOUNTER — Ambulatory Visit: Payer: PPO | Admitting: Oncology

## 2019-07-16 DIAGNOSIS — R198 Other specified symptoms and signs involving the digestive system and abdomen: Secondary | ICD-10-CM

## 2019-07-16 DIAGNOSIS — Z932 Ileostomy status: Secondary | ICD-10-CM

## 2019-07-16 HISTORY — DX: Other specified symptoms and signs involving the digestive system and abdomen: R19.8

## 2019-07-16 HISTORY — DX: Ileostomy status: Z93.2

## 2019-07-16 LAB — BASIC METABOLIC PANEL
Anion gap: 12 (ref 5–15)
BUN: 9 mg/dL (ref 8–23)
CO2: 30 mmol/L (ref 22–32)
Calcium: 9.1 mg/dL (ref 8.9–10.3)
Chloride: 87 mmol/L — ABNORMAL LOW (ref 98–111)
Creatinine, Ser: 0.46 mg/dL (ref 0.44–1.00)
GFR calc Af Amer: >60 mL/min (ref >60)
GFR calc non Af Amer: >60 mL/min (ref >60)
Glucose, Bld: 130 mg/dL — ABNORMAL HIGH (ref 70–99)
Potassium: 4.4 mmol/L (ref 3.5–5.1)
Sodium: 129 mmol/L — ABNORMAL LOW (ref 135–145)

## 2019-07-16 LAB — MAGNESIUM: Magnesium: 1.8 mg/dL (ref 1.7–2.4)

## 2019-07-16 LAB — CBC
HCT: 31.2 % — ABNORMAL LOW (ref 36.0–46.0)
Hemoglobin: 10.2 g/dL — ABNORMAL LOW (ref 12.0–15.0)
MCH: 31.2 pg (ref 26.0–34.0)
MCHC: 32.7 g/dL (ref 30.0–36.0)
MCV: 95.4 fL (ref 80.0–100.0)
Platelets: 273 10*3/uL (ref 150–400)
RBC: 3.27 MIL/uL — ABNORMAL LOW (ref 3.87–5.11)
RDW: 11.8 % (ref 11.5–15.5)
WBC: 7.1 10*3/uL (ref 4.0–10.5)
nRBC: 0 % (ref 0.0–0.2)

## 2019-07-16 LAB — PHOSPHORUS: Phosphorus: 4.8 mg/dL — ABNORMAL HIGH (ref 2.5–4.6)

## 2019-07-16 MED ORDER — PSYLLIUM 95 % PO PACK
1.0000 | PACK | Freq: Two times a day (BID) | ORAL | Status: DC
  Administered 2019-07-16: 1 via ORAL
  Filled 2019-07-16: qty 1

## 2019-07-16 MED ORDER — GABAPENTIN 300 MG PO CAPS: 300.0000 mg | ORAL_CAPSULE | Freq: Three times a day (TID) | ORAL | Status: DC

## 2019-07-16 MED ORDER — SODIUM CHLORIDE 0.9% FLUSH
10.0000 mL | INTRAVENOUS | Status: DC | PRN
  Administered 2019-07-19: 10 mL
  Filled 2019-07-16: qty 40

## 2019-07-16 MED ORDER — MAGNESIUM SULFATE 2 GM/50ML IV SOLN
2.0000 g | Freq: Once | INTRAVENOUS | Status: AC
  Administered 2019-07-16: 2 g via INTRAVENOUS
  Filled 2019-07-16: qty 50

## 2019-07-16 MED ORDER — FERROUS SULFATE 325 (65 FE) MG PO TABS
325.0000 mg | ORAL_TABLET | Freq: Two times a day (BID) | ORAL | Status: DC
  Administered 2019-07-16 – 2019-07-19 (×6): 325 mg via ORAL
  Filled 2019-07-16 (×6): qty 1

## 2019-07-16 MED ORDER — LACTATED RINGERS IV BOLUS: 1000.0000 mL | Freq: Three times a day (TID) | INTRAVENOUS | Status: DC | PRN

## 2019-07-16 MED ORDER — LOPERAMIDE HCL 2 MG PO CAPS: 4.0000 mg | ORAL_CAPSULE | Freq: Three times a day (TID) | ORAL | Status: DC | PRN

## 2019-07-16 MED ORDER — DIPHENOXYLATE-ATROPINE 2.5-0.025 MG PO TABS: 1.0000 | ORAL_TABLET | Freq: Four times a day (QID) | ORAL | Status: DC | PRN

## 2019-07-16 MED ORDER — DIPHENOXYLATE-ATROPINE 2.5-0.025 MG PO TABS
2.0000 | ORAL_TABLET | Freq: Two times a day (BID) | ORAL | Status: DC
  Administered 2019-07-16 (×2): 2 via ORAL
  Filled 2019-07-16 (×2): qty 2

## 2019-07-16 MED ORDER — LACTATED RINGERS IV BOLUS
1000.0000 mL | Freq: Once | INTRAVENOUS | Status: AC
  Administered 2019-07-16: 1000 mL via INTRAVENOUS

## 2019-07-16 MED ORDER — LOPERAMIDE HCL 2 MG PO CAPS: 4.0000 mg | ORAL_CAPSULE | Freq: Three times a day (TID) | ORAL | Status: DC

## 2019-07-16 MED ORDER — ENALAPRILAT 1.25 MG/ML IV SOLN
0.6250 mg | Freq: Four times a day (QID) | INTRAVENOUS | Status: DC | PRN
  Filled 2019-07-16: qty 1

## 2019-07-16 NOTE — Care Management Important Message (Signed)
Important Message  Patient Details IM Letter given to Kathrin Greathouse SW to present to the Patient Name: Kathleen Flores MRN: [id number] Date of Birth: [birth year]   Medicare Important Message Given:  Yes     Kerin Salen 07/16/2019, 10:52 AM

## 2019-07-16 NOTE — Progress Notes (Addendum)
Kathleen Flores [id number] October 21, 1946  CARE TEAM:  PCP: [name], FNP  Outpatient Care Team: Patient Care Team: [name], [place] as PCP - General (Nurse Practitioner) Michael Boston, MD as Consulting Physician (General Surgery) Milus Banister, MD as Consulting Physician (Gastroenterology) Arna Snipe, RN as Oncology Nurse Navigator Ladell Pier, MD as Consulting Physician (Oncology)  Inpatient Treatment Team: Treatment Team: Attending Provider: Michael Boston, MD; Technician: Leda Quail, NT; [place] Nurse: Tenna Child, RN; Technician: Abbe Amsterdam, NT   Problem List:   Principal Problem:   Rectal cancer status post robotic ultralow rectosigmoid resection with diverting loop ileostomy 07/12/2019 Active Problems:   Acquired hypothyroidism   Essential hypertension   Mixed hyperlipidemia   Ileostomy in place [facility])   Anxiety state   Hypokalemia   Hypomagnesemia   4 Days Post-Op  07/12/2019  POST-OPERATIVE DIAGNOSIS:   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  PROCEDURE:  ROBOTIC ULTRA LOW ANTERIOR RECTOSIGMOID RESECTION WITH COLOANAL ANASTOMOSIS DIVERTING LOOP ILEOSTOMY ILEOCECECTOMY OMENTAL PEDICLE FLAP ASSESSMENT OF TISSUE PERFUSION BY FIREFLY IMMUNOFLUORESENCE RIGID PROCTOSCOPY BILATERAL TAP BLOCK  SURGEON:  Adin Hector, MD  OR FINDINGS:   Patient had persistent bulky scarring tumor at the mid/low rectal junction.  No obvious metastatic disease on visceral parietal peritoneum or liver.  The anastomosis rests 1-2 cm from the anal verge by rigid proctoscopy.  It is a side colon to end 31 coloanal stapled anastomosis.  Ileal thickening x15 cm very close to the terminal ileum.  Ileocecectomy done  Diverting loop ileostomy proximal to be colonic anastomosis.  Proximal end cephalad   Assessment  High output ileostomy  [facility] Stay = 4 days)  Plan:   -ERAS protocol solid diet.  Follow-up on pathology.  -Check orthostatics.  Rehydrate as needed.  High output ileostomy.  Slow down diarrhea.  Nausea with Imodium.  Switch to Lomotil BID.  Psyllium twice daily.  Iron.  Extra Lomotil and Imodium for backup. Stop mag oxide Nausea control.  IV fluid bolus now & PRN.  Placed on standing IV fluids if still struggling.  Try and hold off as long as no severe kidney Story  Suspect she would benefit from home IV fluids x 6 weeks to lower readmission rate.  Will have PICC line set up.  Plan IV fluid bolus (1 L lactated Ringer's over 4 hours q. Monday Wednesday Friday) for the next 6 weeks until the small bowel can better adapt.  Try and set up home health versus infusion therapy center.  Anxiolysis.  Remove drain.  Ileostomy care.  Patient learning to self empty in bag.  Continue help with wound ostomy team.  Hypertension.  Hold on blood pressure medicine for now with PRN backup since most likely very dehydrated and acting like she is on high-dose diuretics.  Hypothyroidism.  Levothyroxine  -VTE prophylaxis- SCDs, etc  -mobilize as tolerated to help recovery  Home health request to set up for ileostomy care, PICC line care, home IV fluid therapies for 6 weeks.   30 minutes spent in review, evaluation, examination, counseling, and coordination of care.  More than 50% of that time was spent in counseling.  I discussed operative findings, updated the patient's status, discussed probable steps to recovery, and gave postoperative recommendations to the patient.  Recommendations were made.  Questions were answered.  She expressed understanding & appreciation.   07/16/2019    Subjective: (Chief complaint)  High output from ileostomy.  Nausea with Imodium and "all these pills"  Afraid to take pain medications.  Walked a little bit in hallways.  Tired but not feeling like she is going to pass out.  Objective:  Vital signs:  Vitals:    07/15/19 0529 07/15/19 1330 07/15/19 2105 07/16/19 0614  BP: (!) 105/55 120/61 (!) 123/57 (!) 117/56  Pulse: 76 86 85 93  Resp: 19 18 16 18   Temp: 98.2 F (36.8 C) 98.2 F (36.8 C) 98.4 F (36.9 C) 97.9 F (36.6 C)  TempSrc: Oral Oral Oral Oral  SpO2: 96% 93% 92% 99%  Weight: 68.2 kg   66.2 kg  Height:        Last BM Date: 07/15/19  Intake/Output   Yesterday:  08/02 0701 - 08/03 0700 In: 583 [P.O.:580; I.V.:3] Out: 6900 [Urine:3050; Drains:50; Stool:3800] This shift:  No intake/output data recorded.  Bowel function:  Flatus: YES  BM:  YES  Drain: Serosanguinous   Physical Exam:  General: Pt awake/alert/oriented x4 in no acute distress.  Tired but not sickly/toxic. Eyes: PERRL, normal EOM.  Sclera clear.  No icterus Neuro: CN II-XII intact w/o focal sensory/motor deficits. Lymph: No head/neck/groin lymphadenopathy Psych:  No delerium/psychosis/paranoia.  Anxious but consolable.   HENT: Normocephalic, Mucus membranes moist.  No thrush Neck: Supple, No tracheal deviation Chest: No chest wall pain w good excursion CV:  Pulses intact.  Regular rhythm MS: Normal AROM mjr joints.  No obvious deformity  Abdomen: Soft.  Nondistended.  Mildly tender at incisions only.  Right upper quadrant loop ileostomy pink with gas and mildly thickened tan effluent in bag.  No evidence of peritonitis.  No incarcerated hernias.  Ext:  No deformity.  No mjr edema.  No cyanosis Skin: No petechiae / purpura  Results:   Cultures: Recent Results (from the past 720 hour(s))  SARS Coronavirus 2 (Performed in [place] hospital lab)     Status: None   Collection Time: 07/09/19  2:15 PM   Specimen: Nasal Swab  Result Value Ref Range Status   SARS Coronavirus 2 NEGATIVE NEGATIVE Final    Comment: (NOTE) SARS-CoV-2 target nucleic acids are NOT DETECTED. The SARS-CoV-2 RNA is generally detectable in upper and lower respiratory specimens during the acute phase of infection. Negative  results do not preclude SARS-CoV-2 infection, do not rule out co-infections with other pathogens, and should not be used as the sole basis for treatment or other patient management decisions. Negative results must be combined with clinical observations, patient history, and epidemiological information. The expected result is Negative. Fact Sheet for Patients: SugarRoll.be Fact Sheet for Healthcare Providers: https://www.woods-mathews.com/ This test is not yet approved or cleared by the [place] FDA and  has been authorized for detection and/or diagnosis of SARS-CoV-2 by FDA under an Emergency Use Authorization (EUA). This EUA will remain  in effect (meaning this test can be used) for the duration of the COVID-19 declaration under Section 56 4(b)(1) of the Act, 21 U.S.C. section 360bbb-3(b)(1), unless the authorization is terminated or revoked sooner. Performed at [facility], [place] [address] [place], [place] [address]     Labs: No results found for this or any previous visit (from the past 48 hour(s)).  Imaging / Studies: [place] Ekg Site Rite  Result Date: 07/16/2019 If [facility] image not attached, placement could not be confirmed due to current cardiac rhythm.   Medications / Allergies: per chart  Antibiotics: Anti-infectives (From admission, onward)   Start     Dose/Rate  Route Frequency Ordered Stop   07/12/19 1800  cefoTEtan (CEFOTAN) 2 g in sodium chloride 0.9 % 100 mL IVPB     2 g 200 mL/hr over 30 Minutes Intravenous Every 12 hours 07/12/19 1636 07/12/19 1835   07/12/19 1400  neomycin (MYCIFRADIN) tablet 1,000 mg  Status:  Discontinued     1,000 mg Oral 3 times per day 07/12/19 0535 07/12/19 0537   07/12/19 1400  metroNIDAZOLE (FLAGYL) tablet 1,000 mg  Status:  Discontinued     1,000 mg Oral 3 times per day 07/12/19 0535 07/12/19 0537   07/12/19 1127  clindamycin (CLEOCIN) 900 mg, gentamicin (GARAMYCIN) 240 mg in  sodium chloride 0.9 % 1,000 mL for intraperitoneal lavage  Status:  Discontinued       As needed 07/12/19 1128 07/12/19 1233   07/12/19 0600  clindamycin (CLEOCIN) 900 mg, gentamicin (GARAMYCIN) 240 mg in sodium chloride 0.9 % 1,000 mL for intraperitoneal lavage  Status:  Discontinued      Irrigation To Surgery 07/11/19 1005 07/12/19 1612   07/12/19 0600  cefoTEtan (CEFOTAN) 2 g in sodium chloride 0.9 % 100 mL IVPB     2 g 200 mL/hr over 30 Minutes Intravenous On call to O.R. 07/12/19 0535 07/12/19 0751        Note: Portions of this report may have been transcribed using voice recognition software. Every effort was made to ensure accuracy; however, inadvertent computerized transcription errors may be present.   Any transcriptional errors that result from this process are unintentional.     Adin Hector, MD, FACS, MASCRS Gastrointestinal and Minimally Invasive Surgery    [address], [place], [place] [address] [phone] Main / Paging [phone] Fax

## 2019-07-16 NOTE — Consult Note (Signed)
[place] Nurse ostomy consult note Pt had ileostomy surgery performed on 7/30. Stoma type/location:  Stoma is red and viable, slightly above skin level, 1 1/2 inches.   Peristomal assessment: Intact skin surrounding Output: 100 cc brown liquid  Ostomy pouching:  2pc.  Education provided: Demonstrated pouch change using barrier ring to attempt to maintain a seal, and 2 piece pouching system.  Pt assisted and asked appropriate questions.  Pt was able to open and close velcro without assistance. Educational materials and 5 sets of barrier rings, wafers, and pouches left at the bedside.  Discussed dietary precautions and importance of avoiding dehydration.  Reviewed pouching routines and ordering supplies. Pt could benefit from home health assistance after discharge.  Enrolled patient in [place] program: Yes, previously [place] team will see again on Wed for another teaching session.   Julien Girt MSN, RN, [place], [place], [place]

## 2019-07-16 NOTE — Progress Notes (Signed)
Peripherally Inserted Central Catheter/Midline Placement  The IV Nurse has discussed with the patient and/or persons authorized to consent for the patient, the purpose of this procedure and the potential benefits and risks involved with this procedure.  The benefits include less needle sticks, lab draws from the catheter, and the patient may be discharged home with the catheter. Risks include, but not limited to, infection, bleeding, blood clot (thrombus formation), and puncture of an artery; nerve damage and irregular heartbeat and possibility to perform a PICC exchange if needed/ordered by physician.  Alternatives to this procedure were also discussed.  Bard Power PICC patient education guide, fact sheet on infection prevention and patient information card has been provided to patient /or left at bedside.    PICC/Midline Placement Documentation  PICC Single Lumen 06/07/93 PICC Right Basilic 43 cm 0 cm (Active)  Indication for Insertion or Continuance of Line Prolonged intravenous therapies 07/16/19 1713  Exposed Catheter (cm) 0 cm 07/16/19 1713  Site Assessment Clean;Dry;Intact 07/16/19 1713  Line Status Flushed;Saline locked;Blood return noted 07/16/19 1713  Dressing Type Transparent 07/16/19 1713  Dressing Status Clean;Dry;Intact 07/16/19 1713  Dressing Intervention New dressing 07/16/19 1713  Dressing Change Due 07/23/19 07/16/19 1713       Deb Loudin, Nicolette Bang 07/16/2019, 5:14 PM

## 2019-07-17 LAB — MAGNESIUM: Magnesium: 2.3 mg/dL (ref 1.7–2.4)

## 2019-07-17 LAB — BASIC METABOLIC PANEL
Anion gap: 11 (ref 5–15)
BUN: 11 mg/dL (ref 8–23)
CO2: 31 mmol/L (ref 22–32)
Calcium: 9 mg/dL (ref 8.9–10.3)
Chloride: 87 mmol/L — ABNORMAL LOW (ref 98–111)
Creatinine, Ser: 0.55 mg/dL (ref 0.44–1.00)
GFR calc Af Amer: >60 mL/min (ref >60)
GFR calc non Af Amer: >60 mL/min (ref >60)
Glucose, Bld: 127 mg/dL — ABNORMAL HIGH (ref 70–99)
Potassium: 4.4 mmol/L (ref 3.5–5.1)
Sodium: 129 mmol/L — ABNORMAL LOW (ref 135–145)

## 2019-07-17 MED ORDER — SODIUM CHLORIDE 0.9 % IV BOLUS
500.0000 mL | Freq: Once | INTRAVENOUS | Status: AC
  Administered 2019-07-17: 500 mL via INTRAVENOUS

## 2019-07-17 MED ORDER — DIPHENOXYLATE-ATROPINE 2.5-0.025 MG PO TABS
2.0000 | ORAL_TABLET | Freq: Four times a day (QID) | ORAL | Status: DC
  Administered 2019-07-17 – 2019-07-19 (×9): 2 via ORAL
  Filled 2019-07-17 (×9): qty 2

## 2019-07-17 MED ORDER — PROMETHAZINE HCL 25 MG/ML IJ SOLN
12.5000 mg | Freq: Four times a day (QID) | INTRAMUSCULAR | Status: DC | PRN
  Administered 2019-07-18: 12.5 mg via INTRAVENOUS
  Filled 2019-07-17: qty 1

## 2019-07-17 MED ORDER — POLYETHYLENE GLYCOL 3350 17 G PO PACK
17.0000 g | PACK | Freq: Two times a day (BID) | ORAL | Status: DC
  Administered 2019-07-17 – 2019-07-19 (×3): 17 g via ORAL
  Filled 2019-07-17 (×6): qty 1

## 2019-07-17 MED ORDER — ENSURE ENLIVE PO LIQD
237.0000 mL | Freq: Two times a day (BID) | ORAL | Status: DC
  Administered 2019-07-18 (×2): 237 mL via ORAL

## 2019-07-17 MED ORDER — SODIUM CHLORIDE 0.9 % IV SOLN
8.0000 mg | Freq: Four times a day (QID) | INTRAVENOUS | Status: DC | PRN
  Administered 2019-07-17: 8 mg via INTRAVENOUS
  Filled 2019-07-17 (×2): qty 4

## 2019-07-17 NOTE — Progress Notes (Signed)
Initial Nutrition Assessment  INTERVENTION:   -Provide Ensure Enlive po BID, each supplement provides 350 kcal and 20 grams of protein -D/c Ensure Surgery  NUTRITION DIAGNOSIS:   Increased nutrient needs related to post-op healing, cancer and cancer related treatments as evidenced by estimated needs.  GOAL:   Patient will meet greater than or equal to 90% of their needs  MONITOR:   PO intake, Supplement acceptance, Labs, Weight trends, I & O's  REASON FOR ASSESSMENT:   Consult Diet education  ASSESSMENT:   73 year old female with colorectal cancer, received radiation and chemotherapy. 7/30: s/p ROBOTIC LOW ANTERIOR RECTOSIGMOID RESECTION POSSIBLE OSTOMY, RIGID PROCTOSCOPY   **RD working remotely**  Patient reports having a poor appetite but she is happy with her breakfast this morning since her diet was changed. States she wishes she could receive her breakfast earlier. Will place a note in [place]. Pt states she was on a liquid diet for almost a month prior to surgery. Pt states she was also receiving chemotherapy and radiation for her colorectal cancer. Pt's husband has been bringing in the type of Ensure she prefers to drink as her supplement. She was unable to give me the name. Will switch supplement to Ensure Enlive. Pt states she knows she needs to improve her nutrition.   RD placed ileostomy diet information in discharge instructions. Answered pt's diet related questions.   Per patient UBW is 147-149 lbs. States she weighed 139 lbs PTA. Her weight is starting to increase since admission, suspect fluid is involved. Per I/Os: -9.3L since admit.   Medications: Vitamin D tablet daily, Ferrous sulfate tablet BID, IV Zofran PRN, Compazine PRN Labs reviewed: Low Na  NUTRITION - FOCUSED PHYSICAL EXAM:  Unable to perform -working remotely.  Diet Order:   Diet Order            Diet regular Room service appropriate? Yes; Fluid consistency: Thin  Diet effective now              EDUCATION NEEDS:   Education needs have been addressed  Skin:  Skin Assessment: Reviewed RN Assessment  Last BM:  8/4  Height:   Ht Readings from Last 1 Encounters:  07/12/19 5\' 7"  (1.702 m)    Weight:   Wt Readings from Last 1 Encounters:  07/16/19 66.2 kg    Ideal Body Weight:  61.3 kg  BMI:  Body mass index is 22.86 kg/m.  Estimated Nutritional Needs:   Kcal:  1900-2100  Protein:  95-105g  Fluid:  2L/day  Clayton Bibles, MS, RD, LDN [place] Dietitian Pager: [phone] After Hours Pager: [phone]

## 2019-07-17 NOTE — Progress Notes (Signed)
Kathleen Flores [id number] 1946/03/04  CARE TEAM:  PCP: [name], FNP  Outpatient Care Team: Patient Care Team: [name], [place] as PCP - General (Nurse Practitioner) Michael Boston, MD as Consulting Physician (General Surgery) Milus Banister, MD as Consulting Physician (Gastroenterology) Arna Snipe, RN as Oncology Nurse Navigator Ladell Pier, MD as Consulting Physician (Oncology)  Inpatient Treatment Team: Treatment Team: Attending Provider: Michael Boston, MD; [place] Nurse: Tenna Child, RN; Technician: Abbe Amsterdam, NT; Consulting Physician: Edison Pace, Md, MD; Utilization Review: Orlean Bradford, RN; Consulting Physician: Ladell Pier, MD; Registered Nurse: Sunny Schlein, RN; Registered Nurse: Guadlupe Spanish, RN   Problem List:   Principal Problem:   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  PROCEDURE:  ROBOTIC ULTRA LOW ANTERIOR RECTOSIGMOID RESECTION WITH COLOANAL ANASTOMOSIS DIVERTING LOOP ILEOSTOMY ILEOCECECTOMY OMENTAL PEDICLE FLAP ASSESSMENT OF TISSUE PERFUSION BY FIREFLY IMMUNOFLUORESENCE RIGID PROCTOSCOPY BILATERAL TAP BLOCK  SURGEON:  Adin Hector, MD  OR FINDINGS:   Patient had persistent bulky scarring tumor at the mid/low rectal junction.  No obvious metastatic disease on visceral parietal peritoneum or liver.  The anastomosis rests 1-2 cm from the anal verge by rigid proctoscopy.  It is a side colon to end 31 coloanal stapled anastomosis.  Ileal thickening x15  cm very close to the terminal ileum.  Ileocecectomy done  Diverting loop ileostomy proximal to be colonic anastomosis.  Proximal end cephalad   Assessment  High output ileostomy   [facility] Stay = 5 days)  Plan:  ERAS protocol solid diet.  Regular diet since she does not like a heart healthy  Pathology consistent with ypT1ypN1 (1/22 lymph nodes).  She was already seen by Dr. Gearldine Shown morning.  They benefit from post adjuvant chemotherapy.  Most likely oral Xeloda but will discuss at tumor board.  Copy pathology left with the patient.    Check orthostatics.  Rehydrate as needed.  High output ileostomy.  Slow down diarrhea.  Increase Lomotil to 4 times daily.  Switch to different fiber supplement.  Continue iron.  Suspect she would benefit from home IV fluids x 6 weeks to lower readmission rate.  Will have PICC line set up.  Plan IV fluid bolus (1 L lactated Ringer's over 4 hours q. Monday Wednesday Friday) for the next 6 weeks until the small bowel can better adapt.  Try and set up home health versus infusion therapy center.  Anxiolysis.  Ileostomy care.  Patient learning to self empty in bag.  Continue help with wound ostomy team.  Hypertension.  Holding on blood pressure medicine for now with PRN backup since most likely very dehydrated and acting like she is on high-dose diuretics.  Hypothyroidism.  Levothyroxine  VTE prophylaxis- SCDs, etc  Mobilize as tolerated to help recovery  Home health request to set up for ileostomy care, PICC line care, home IV fluid therapies for 6 weeks.   20 minutes spent in review, evaluation, examination, counseling, and coordination of care.  More than 50% of that time was spent in counseling.  I discussed  operative findings, updated the patient's status, discussed probable steps to recovery, and gave postoperative recommendations to the patient.  Recommendations were made.  Questions were answered.  She expressed understanding &  appreciation.   07/17/2019    Subjective: (Chief complaint)  Does not like the food in the hospital.  Still general nauseated, especially with the psyllium for some reason.  Emptying the ileostomy at least every hour or so but output is less.  Less pain.  Walking more.  Hoping to go home soon.  Hates the ileostomy bag is trying to tolerate emptying and changing the bag  Objective:  Vital signs:  Vitals:   07/16/19 1120 07/16/19 1350 07/16/19 2059 07/17/19 0432  BP: 134/67 122/65 115/74 (!) 112/53  Pulse:  85 92 95  Resp:  15 18 18   Temp:  98 F (36.7 C) 98.3 F (36.8 C) 97.9 F (36.6 C)  TempSrc:  Oral Oral Oral  SpO2:  98% 95% 100%  Weight:      Height:        Last BM Date: 07/16/19  Intake/Output   Yesterday:  08/03 0701 - 08/04 0700 In: 2220 [P.O.:1170; IV Piggyback:1050] Out: 2645 [Urine:1125; Drains:20; Stool:1500] This shift:  No intake/output data recorded.  Bowel function:  Flatus: YES  BM:  YES  Drain: (No drain)   Physical Exam:  General: Pt awake/alert/oriented x4 in no acute distress.  Tired but not sickly/toxic. Eyes: PERRL, normal EOM.  Sclera clear.  No icterus Neuro: CN II-XII intact w/o focal sensory/motor deficits. Lymph: No head/neck/groin lymphadenopathy Psych:  No delerium/psychosis/paranoia.  Anxious but consolable.   HENT: Normocephalic, Mucus membranes moist.  No thrush Neck: Supple, No tracheal deviation Chest: No chest wall pain w good excursion CV:  Pulses intact.  Regular rhythm MS: Normal AROM mjr joints.  No obvious deformity  Abdomen: Soft.  Nondistended.  Mildly tender at incisions only.  Right upper quadrant loop ileostomy pink with gas and thin tan effluent in bag.  No evidence of peritonitis.  No incarcerated hernias.  Ext:  No deformity.  No mjr edema.  No cyanosis Skin: No petechiae / purpura  Results:   Cultures: Recent Results (from the past 720 hour(s))  SARS Coronavirus 2 (Performed in [facility]  hospital lab)     Status: None   Collection Time: 07/09/19  2:15 PM   Specimen: Nasal Swab  Result Value Ref Range Status   SARS Coronavirus 2 NEGATIVE NEGATIVE Final    Comment: (NOTE) SARS-CoV-2 target nucleic acids are NOT DETECTED. The SARS-CoV-2 RNA is generally detectable in upper and lower respiratory specimens during the acute phase of infection. Negative results do not preclude SARS-CoV-2 infection, do not rule out co-infections with other pathogens, and should not be used as the sole basis for treatment or other patient management decisions. Negative results must be combined with clinical observations, patient history, and epidemiological information. The expected result is Negative. Fact Sheet for Patients: SugarRoll.be Fact Sheet for Healthcare Providers: https://www.woods-mathews.com/ This test is not yet approved or cleared by the [place] FDA and  has been authorized for detection and/or diagnosis of SARS-CoV-2 by FDA under an Emergency Use Authorization (EUA). This EUA will remain  in effect (meaning this test can be used) for the duration of the COVID-19 declaration under Section 56 4(b)(1) of the Act, 21 U.S.C. section 360bbb-3(b)(1), unless the authorization is terminated or revoked sooner. Performed at [facility], [place] [address], [place], [place] [address]     Labs:  Results for orders placed or performed during the hospital encounter of 07/12/19 (from the past 48 hour(s))  Basic metabolic panel     Status: Abnormal   Collection Time: 07/16/19  8:09 AM  Result Value Ref Range   Sodium 129 (L) 135 - 145 mmol/L   Potassium 4.4 3.5 - 5.1 mmol/L   Chloride 87 (L) 98 - 111 mmol/L   CO2 30 22 - 32 mmol/L   Glucose, Bld 130 (H) 70 - 99 mg/dL   BUN 9 8 - 23 mg/dL   Creatinine, Ser 0.46 0.44 - 1.00 mg/dL   Calcium 9.1 8.9 - 10.3 mg/dL   GFR calc non Af Amer >60 >60 mL/min   GFR calc Af Amer >60 >60 mL/min    Anion gap 12 5 - 15    Comment: Performed at [facility] [place] [address], [place], [place] [address]  Magnesium     Status: None   Collection Time: 07/16/19  8:09 AM  Result Value Ref Range   Magnesium 1.8 1.7 - 2.4 mg/dL    Comment: Performed at [facility], [place] [address], [place], [place] [address]  Phosphorus     Status: Abnormal   Collection Time: 07/16/19  8:09 AM  Result Value Ref Range   Phosphorus 4.8 (H) 2.5 - 4.6 mg/dL    Comment: Performed at [facility], [place] [address] [place], [place] [address]  CBC     Status: Abnormal   Collection Time: 07/16/19  8:09 AM  Result Value Ref Range   WBC 7.1 4.0 - 10.5 K/uL   RBC 3.27 (L) 3.87 - 5.11 MIL/uL   Hemoglobin 10.2 (L) 12.0 - 15.0 g/dL   HCT 31.2 (L) 36.0 - 46.0 %   MCV 95.4 80.0 - 100.0 fL   MCH 31.2 26.0 - 34.0 pg   MCHC 32.7 30.0 - 36.0 g/dL   RDW 11.8 11.5 - 15.5 %   Platelets 273 150 - 400 K/uL   nRBC 0.0 0.0 - 0.2 %    Comment: Performed at [facility], [place] [address], [place], [place] [address]  Basic metabolic panel     Status: Abnormal   Collection Time: 07/17/19  3:39 AM  Result Value Ref Range   Sodium 129 (L) 135 - 145 mmol/L   Potassium 4.4 3.5 - 5.1 mmol/L   Chloride 87 (L) 98 - 111 mmol/L   CO2 31 22 - 32 mmol/L   Glucose, Bld 127 (H) 70 - 99 mg/dL   BUN 11 8 - 23 mg/dL   Creatinine, Ser 0.55 0.44 - 1.00 mg/dL   Calcium 9.0 8.9 - 10.3 mg/dL   GFR calc non Af Amer >60 >60 mL/min   GFR calc Af Amer >60 >60 mL/min   Anion gap 11 5 - 15    Comment: Performed at [facility], [place] [address] [place], [place] [address]  Magnesium     Status: None   Collection Time: 07/17/19  3:39 AM  Result Value Ref Range   Magnesium 2.3 1.7 - 2.4 mg/dL    Comment: Performed at [facility], [place] [address], [place], [place] [address]    Imaging / Studies: [place] Ekg Site Rite  Result Date: 07/16/2019 If  [facility] image not attached, placement could not be confirmed due to current cardiac rhythm.   Medications / Allergies: per chart  Antibiotics: Anti-infectives (From admission, onward)   Start  Dose/Rate Route Frequency Ordered Stop   07/12/19 1800  cefoTEtan (CEFOTAN) 2 g in sodium chloride 0.9 % 100 mL IVPB     2 g 200 mL/hr over 30 Minutes Intravenous Every 12 hours 07/12/19 1636 07/12/19 1835   07/12/19 1400  neomycin (MYCIFRADIN) tablet 1,000 mg  Status:  Discontinued     1,000 mg Oral 3 times per day 07/12/19 0535 07/12/19 0537   07/12/19 1400  metroNIDAZOLE (FLAGYL) tablet 1,000 mg  Status:  Discontinued     1,000 mg Oral 3 times per day 07/12/19 0535 07/12/19 0537   07/12/19 1127  clindamycin (CLEOCIN) 900 mg, gentamicin (GARAMYCIN) 240 mg in sodium chloride 0.9 % 1,000 mL for intraperitoneal lavage  Status:  Discontinued       As needed 07/12/19 1128 07/12/19 1233   07/12/19 0600  clindamycin (CLEOCIN) 900 mg, gentamicin (GARAMYCIN) 240 mg in sodium chloride 0.9 % 1,000 mL for intraperitoneal lavage  Status:  Discontinued      Irrigation To Surgery 07/11/19 1005 07/12/19 1612   07/12/19 0600  cefoTEtan (CEFOTAN) 2 g in sodium chloride 0.9 % 100 mL IVPB     2 g 200 mL/hr over 30 Minutes Intravenous On call to O.R. 07/12/19 0535 07/12/19 0751        Note: Portions of this report may have been transcribed using voice recognition software. Every effort was made to ensure accuracy; however, inadvertent computerized transcription errors may be present.   Any transcriptional errors that result from this process are unintentional.     Adin Hector, MD, FACS, MASCRS Gastrointestinal and Minimally Invasive Surgery    [address], [place] [employer], [place] [address] [phone] Main / Paging [phone] Fax

## 2019-07-18 LAB — BASIC METABOLIC PANEL
Anion gap: 8 (ref 5–15)
BUN: 10 mg/dL (ref 8–23)
CO2: 31 mmol/L (ref 22–32)
Calcium: 8.5 mg/dL — ABNORMAL LOW (ref 8.9–10.3)
Chloride: 89 mmol/L — ABNORMAL LOW (ref 98–111)
Creatinine, Ser: 0.62 mg/dL (ref 0.44–1.00)
GFR calc Af Amer: >60 mL/min (ref >60)
GFR calc non Af Amer: >60 mL/min (ref >60)
Glucose, Bld: 121 mg/dL — ABNORMAL HIGH (ref 70–99)
Potassium: 3.9 mmol/L (ref 3.5–5.1)
Sodium: 128 mmol/L — ABNORMAL LOW (ref 135–145)

## 2019-07-18 MED ORDER — LACTATED RINGERS IV BOLUS: 1000.0000 mL | Freq: Three times a day (TID) | INTRAVENOUS | Status: DC | PRN

## 2019-07-18 MED ORDER — LACTATED RINGERS IV BOLUS
1000.0000 mL | Freq: Every day | INTRAVENOUS | Status: DC
  Administered 2019-07-18 – 2019-07-19 (×2): 1000 mL via INTRAVENOUS

## 2019-07-18 MED ORDER — METOCLOPRAMIDE HCL 5 MG/ML IJ SOLN: 5.0000 mg | Freq: Three times a day (TID) | INTRAMUSCULAR | Status: DC | PRN

## 2019-07-18 MED ORDER — VITAMIN C 500 MG PO TABS
1000.0000 mg | ORAL_TABLET | Freq: Every day | ORAL | Status: DC
  Administered 2019-07-18 – 2019-07-19 (×2): 1000 mg via ORAL
  Filled 2019-07-18 (×2): qty 2

## 2019-07-18 NOTE — TOC Initial Note (Addendum)
Transition of Care [facility]) - Initial/Assessment Note    Patient Details  Name: Kathleen Flores MRN: [id number] Date of Birth: Jul 07, 1946  Transition of Care [facility]) CM/SW Contact:    Chancelor Hardrick, Marjie Skiff, RN Phone Number: 07/18/2019, 3:45 PM  Clinical Narrative:                 Pt to dc with new ostomy and IV infusion services. Pt offered choice for home infusion and [facility] chosen. [facility] to provide [facility] services.  Expected Discharge Plan: [place] Barriers to Discharge: Continued Medical Work up   Patient Goals and CMS Choice Patient states their goals for this hospitalization and ongoing recovery are:: feel better      Expected Discharge Plan and Services Expected Discharge Plan: [place]   Discharge Planning Services: CM Consult   Living arrangements for the past 2 months: Single Family Home Expected Discharge Date: 07/16/19                         HH Arranged: (IV infusion) HH Agency: [facility] Date HH Agency Contacted: 07/18/19 Time HH Agency Contacted: 1000 Representative spoke with at [place]: [place]  Prior Living Arrangements/Services Living arrangements for the past 2 months: [place] with:: Spouse Patient language and need for interpreter reviewed:: Yes Do you feel safe going back to the place where you live?: Yes      Need for Family Participation in Patient Care: Yes (Comment) Care giver support system in place?: Yes (comment)   Criminal Activity/Legal Involvement Pertinent to Current Situation/Hospitalization: No - Comment as needed  Activities of Daily Living Home Assistive Devices/Equipment: Eyeglasses ADL Screening (condition at time of admission) Patient's cognitive ability adequate to safely complete daily activities?: Yes Is the patient deaf or have difficulty hearing?: No Does the patient have difficulty seeing, even when wearing glasses/contacts?: No Does the patient have difficulty  concentrating, remembering, or making decisions?: No Patient able to express need for assistance with ADLs?: Yes Does the patient have difficulty dressing or bathing?: No Independently performs ADLs?: Yes (appropriate for developmental age) Does the patient have difficulty walking or climbing stairs?: No Weakness of Legs: None Weakness of Arms/Hands: None  Permission Sought/Granted Permission sought to share information with : Chartered certified accountant granted to share information with : Yes, Verbal Permission Granted     Permission granted to share info w AGENCY: [facility]        Emotional Assessment Appearance:: Appears stated age Attitude/Demeanor/Rapport: Apprehensive Affect (typically observed): Grieving, Anxious Orientation: : Oriented to Self, Oriented to Place, Oriented to  Time, Oriented to Situation      Admission diagnosis:  Mid Rectal Cancer Patient Active Problem List   Diagnosis Date Noted  . High output ileostomy ([place]) 07/16/2019  . Hypokalemia 07/13/2019  . Hypomagnesemia 07/13/2019  . Ileostomy in place [facility]) 07/12/2019  . Anxiety state 07/12/2019  . Abdominal pain, epigastric   . 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 Hyperglycemia 10/17/2013  . Vitamin D deficiency 10/17/2013  . Acquired hypothyroidism 12/29/2011  . Essential hypertension 12/29/2011  . Mixed hyperlipidemia 12/29/2011   PCP:  [name], [place] Pharmacy:   [place] [address] [place] [address] Phone: [phone] Fax: [place], [place] - [phone]  [facility] [place] [address] Phone: [phone] Fax: [phone]  [place], [place] - [address]  [place] [address] Phone: [phone] Fax: [phone]     Social Determinants of Health (SDOH) Interventions    Readmission Risk Interventions Readmission Risk Prevention Plan 07/18/2019  Transportation Screening Complete  PCP or Specialist Appt within 3-5 Days Not Complete  Not Complete comments surgeon in 3 weeks  [place] or [place] Complete  Social Work Consult for [place] Planning/Counseling Not Complete  SW consult not completed comments na  Palliative Care Screening Not Complete  Palliative Care Screening Not Complete Comments na  Medication Review Press photographer) Complete  Some recent data might be hidden

## 2019-07-18 NOTE — Progress Notes (Addendum)
Kathleen Flores [id number] 05/15/1946  CARE TEAM:  PCP: [name], FNP  Outpatient Care Team: Patient Care Team: [name], [place] as PCP - General (Nurse Practitioner) Michael Boston, MD as Consulting Physician (General Surgery) Milus Banister, MD as Consulting Physician (Gastroenterology) Arna Snipe, RN as Oncology Nurse Navigator Ladell Pier, MD as Consulting Physician (Oncology)  Inpatient Treatment Team: Treatment Team: Attending Provider: Michael Boston, MD; [place] Nurse: Tenna Child, RN; Technician: Abbe Amsterdam, NT; Consulting Physician: Edison Pace, Md, MD; Utilization Review: Orlean Bradford, RN; Consulting Physician: Ladell Pier, MD; Registered Nurse: Sunny Schlein, RN; Utilization Review: Miringu, Mikael Spray, RN   Problem List:   Principal Problem:   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  PROCEDURE:  ROBOTIC ULTRA LOW ANTERIOR RECTOSIGMOID RESECTION WITH COLOANAL ANASTOMOSIS DIVERTING LOOP ILEOSTOMY ILEOCECECTOMY OMENTAL PEDICLE FLAP ASSESSMENT OF TISSUE PERFUSION BY FIREFLY IMMUNOFLUORESENCE RIGID PROCTOSCOPY BILATERAL TAP BLOCK  SURGEON:  Adin Hector, MD  OR FINDINGS:   Patient had persistent bulky scarring tumor at the mid/low rectal junction.  No obvious metastatic disease on visceral parietal peritoneum or liver.  The anastomosis rests 1-2 cm from the anal verge by rigid proctoscopy.  It is a side colon to end 31 coloanal stapled anastomosis.  Ileal thickening  x15 cm very close to the terminal ileum.  Ileocecectomy done  Diverting loop ileostomy proximal to be colonic anastomosis.  Proximal end cephalad   Assessment  High output ileostomy    [facility] Stay = 6 days) Plan:  Retry solid diet.  Do not use Phenergan.  That was not my option anyway.  Try Zofran or Compazine or Reglan in that order.  Pathology consistent with ypT1ypN1 (1/22 lymph nodes).  She was already seen by Dr. Gearldine Shown morning.  They benefit from post adjuvant chemotherapy.  Most likely oral Xeloda but will discuss at tumor board.  Copy pathology left with the patient.    IV fluid bolus in the morning to avoid getting dehydrated.  Transition to Monday Wednesday Friday at discharge  High output ileostomy.  Slow down diarrhea.  Increase Lomotil to 4 times daily.  Switch to different fiber supplement.  Continue iron.  Suspect she would benefit from home IV fluids x 6 weeks to lower readmission rate.  Will have PICC line set up.  Plan IV fluid bolus (1 L lactated Ringer's over 4 hours q. Monday Wednesday Friday) for the next 6 weeks until the small bowel can better adapt.  Try and set up home health versus infusion therapy center.  Anxiolysis.  Ileostomy care.  Patient learning to self empty in bag.  Continue help with wound ostomy team.  Hypertension.  Holding on blood pressure medicine for now with PRN backup since most likely very dehydrated and acting like she is on high-dose diuretics.  Hypothyroidism.  Levothyroxine  VTE prophylaxis- SCDs, etc  Mobilize as tolerated to help recovery  Home health request to set up for ileostomy care, PICC line care, home IV fluid therapies for 6 weeks.   D/C patient from  hospital when patient meets criteria (anticipate in 1-2 day(s)):  Tolerating oral intake well Ambulating well Adequate pain control without IV medications Urinating  Having flatus Disposition planning in place   30 minutes spent in review, evaluation,  examination, counseling, and coordination of care.  More than 50% of that time was spent in counseling.  I discussed operative findings, updated the patient's status, discussed probable steps to recovery, and gave postoperative recommendations to the patient.  Recommendations were made.  Questions were answered.  She expressed understanding & appreciation.   07/18/2019    Subjective: (Chief complaint)  Trying to take Phenergan to eat regular food and felt nauseated and threw up.  Has felt better since then.  Ileostomy output slowing down the last 12 hours and thicker.  Denies pain.  Worried about going home but feels like she is close.  Occasional tachycardia with anxiety last night  Objective:  Vital signs:  Vitals:   07/17/19 1610 07/17/19 2204 07/17/19 2310 07/18/19 0540  BP: 138/62 (!) 152/63  (!) 142/62  Pulse: 70 (!) 121 (!) 119 90  Resp:  18  18  Temp:  98.4 F (36.9 C) 99.6 F (37.6 C) 98.6 F (37 C)  TempSrc:  Oral Oral Oral  SpO2:  98% 96% 96%  Weight:      Height:        Last BM Date: 07/17/19  Intake/Output   Yesterday:  08/04 0701 - 08/05 0700 In: 1723.5 [P.O.:1420; I.V.:120; IV Piggyback:183.5] Out: 9675 [Urine:1106; Emesis/NG output:125; FFMBW:4665] This shift:  No intake/output data recorded.  Bowel function:  Flatus: YES  BM:  YES -oatmeal consistency effluent.  Thicker.  Drain: (No drain)   Physical Exam:  General: Pt awake/alert/oriented x4 in no acute distress.  Tired but not sickly/toxic. Eyes: PERRL, normal EOM.  Sclera clear.  No icterus Neuro: CN II-XII intact w/o focal sensory/motor deficits. Lymph: No head/neck/groin lymphadenopathy Psych:  No delerium/psychosis/paranoia.  Anxious but consolable.   HENT: Normocephalic, Mucus membranes moist.  No thrush Neck: Supple, No tracheal deviation Chest: No chest wall pain w good excursion CV:  Pulses intact.  Regular rhythm MS: Normal AROM mjr joints.  No obvious deformity  Abdomen:  Soft.  Nondistended.  Nontender.  Right upper quadrant loop ileostomy pink with gas and thicker effluent in bag.  No evidence of peritonitis.  No incarcerated hernias.  Ext:  No deformity.  No mjr edema.  No cyanosis Skin: No petechiae / purpura  Results:   Cultures: Recent Results (from the past 720 hour(s))  SARS Coronavirus 2 (Performed in [place] hospital lab)     Status: None   Collection Time: 07/09/19  2:15 PM   Specimen: Nasal Swab  Result Value Ref Range Status   SARS Coronavirus 2 NEGATIVE NEGATIVE Final    Comment: (NOTE) SARS-CoV-2 target nucleic acids are NOT DETECTED. The SARS-CoV-2 RNA is generally detectable in upper and lower respiratory specimens during the acute phase of infection. Negative results do not preclude SARS-CoV-2 infection, do not rule out co-infections with other pathogens, and should not be used as the sole basis for treatment or other patient management decisions. Negative results must be combined with clinical observations, patient history, and epidemiological information. The expected result is Negative. Fact Sheet for Patients: SugarRoll.be Fact Sheet for Healthcare Providers: https://www.woods-mathews.com/ This test is not yet approved or cleared by the [place] FDA and  has been authorized for detection and/or diagnosis of SARS-CoV-2 by FDA under an Emergency Use Authorization (EUA). This  EUA will remain  in effect (meaning this test can be used) for the duration of the COVID-19 declaration under Section 56 4(b)(1) of the Act, 21 U.S.C. section 360bbb-3(b)(1), unless the authorization is terminated or revoked sooner. Performed at [facility], [place] [address], [place], [place] [address]     Labs: Results for orders placed or performed during the hospital encounter of 07/12/19 (from the past 48 hour(s))  Basic metabolic panel     Status: Abnormal   Collection Time: 07/17/19  3:39 AM   Result Value Ref Range   Sodium 129 (L) 135 - 145 mmol/L   Potassium 4.4 3.5 - 5.1 mmol/L   Chloride 87 (L) 98 - 111 mmol/L   CO2 31 22 - 32 mmol/L   Glucose, Bld 127 (H) 70 - 99 mg/dL   BUN 11 8 - 23 mg/dL   Creatinine, Ser 0.55 0.44 - 1.00 mg/dL   Calcium 9.0 8.9 - 10.3 mg/dL   GFR calc non Af Amer >60 >60 mL/min   GFR calc Af Amer >60 >60 mL/min   Anion gap 11 5 - 15    Comment: Performed at [facility], [place] [address], [place], [place] [address]  Magnesium     Status: None   Collection Time: 07/17/19  3:39 AM  Result Value Ref Range   Magnesium 2.3 1.7 - 2.4 mg/dL    Comment: Performed at [facility], [place] [address] [place], [place] [address]  Basic metabolic panel     Status: Abnormal   Collection Time: 07/18/19  4:02 AM  Result Value Ref Range   Sodium 128 (L) 135 - 145 mmol/L   Potassium 3.9 3.5 - 5.1 mmol/L   Chloride 89 (L) 98 - 111 mmol/L   CO2 31 22 - 32 mmol/L   Glucose, Bld 121 (H) 70 - 99 mg/dL   BUN 10 8 - 23 mg/dL   Creatinine, Ser 0.62 0.44 - 1.00 mg/dL   Calcium 8.5 (L) 8.9 - 10.3 mg/dL   GFR calc non Af Amer >60 >60 mL/min   GFR calc Af Amer >60 >60 mL/min   Anion gap 8 5 - 15    Comment: Performed at [facility], [place] [address] [place], [place] [address]    Imaging / Studies: No results found.  Medications / Allergies: per chart  Antibiotics: Anti-infectives (From admission, onward)   Start     Dose/Rate Route Frequency Ordered Stop   07/12/19 1800  cefoTEtan (CEFOTAN) 2 g in sodium chloride 0.9 % 100 mL IVPB     2 g 200 mL/hr over 30 Minutes Intravenous Every 12 hours 07/12/19 1636 07/12/19 1835   07/12/19 1400  neomycin (MYCIFRADIN) tablet 1,000 mg  Status:  Discontinued     1,000 mg Oral 3 times per day 07/12/19 0535 07/12/19 0537   07/12/19 1400  metroNIDAZOLE (FLAGYL) tablet 1,000 mg  Status:  Discontinued     1,000 mg Oral 3 times per day 07/12/19 0535 07/12/19 0537   07/12/19  1127  clindamycin (CLEOCIN) 900 mg, gentamicin (GARAMYCIN) 240 mg in sodium chloride 0.9 % 1,000 mL for intraperitoneal lavage  Status:  Discontinued       As needed 07/12/19 1128 07/12/19 1233   07/12/19 0600  clindamycin (CLEOCIN) 900 mg, gentamicin (GARAMYCIN) 240 mg in sodium chloride 0.9 % 1,000 mL for intraperitoneal lavage  Status:  Discontinued      Irrigation To Surgery 07/11/19 1005 07/12/19 1612  07/12/19 0600  cefoTEtan (CEFOTAN) 2 g in sodium chloride 0.9 % 100 mL IVPB     2 g 200 mL/hr over 30 Minutes Intravenous On call to O.R. 07/12/19 0535 07/12/19 0751        Note: Portions of this report may have been transcribed using voice recognition software. Every effort was made to ensure accuracy; however, inadvertent computerized transcription errors may be present.   Any transcriptional errors that result from this process are unintentional.     Adin Hector, MD, FACS, MASCRS Gastrointestinal and Minimally Invasive Surgery    [address], [place], [place] [address] [phone] Main / Paging [phone] Fax

## 2019-07-18 NOTE — Consult Note (Signed)
[place] Nurse ostomy follow up Stoma type/location: RLQ ileostomy. States pouch was changed yesterday due to leakage but spouse is here and she agrees to a pouch change.  The pouch has once again leaked .  Switching to a convex system makes sense.   Stomal assessment/size: 1 1/4" pink moist. Producing liquid brown stool.  Suture line extends beyond peristomal at 9 o'clock and this area is protected with a barrier ring.  Peristomal assessment: see above Treatment options for stomal/peristomal skin: barrier ring, implement 1 piece convex pouch and recommending a belt be mailed to patient.  Output liquid brown stool Ostomy pouching: 1pc. Convex with belt Education provided: Pouch change performed.  Explained rationale for change to 1 piece convex.  Patient is anxious regarding leaks and odor and is hopeful this will help.  She cuts barrier to fit with minimal coaching  She demonstrate open and close of pouch.  She understands to empty when 1/3 full. She is eager to go home and understands that [facility] teaching will be ongoing.  She has supplies but I have ordered additional convex bags,  Will get secure start to mail her a belt as well.  Enrolled patient in [facility] Start Discharge program: Yes  See changes above.  I will update secure start.  [place] team will follow.  Domenic Moras MSN, RN, FNP-BC CWON Wound, Ostomy, Continence Nurse Pager [phone]

## 2019-07-19 LAB — CREATININE, SERUM
Creatinine, Ser: 0.5 mg/dL (ref 0.44–1.00)
GFR calc Af Amer: >60 mL/min (ref >60)
GFR calc non Af Amer: >60 mL/min (ref >60)

## 2019-07-19 LAB — POTASSIUM: Potassium: 3.8 mmol/L (ref 3.5–5.1)

## 2019-07-19 MED ORDER — FERROUS SULFATE 325 (65 FE) MG PO TABS: 325.0000 mg | ORAL_TABLET | Freq: Two times a day (BID) | ORAL | 1 refills | Status: DC

## 2019-07-19 MED ORDER — DIPHENOXYLATE-ATROPINE 2.5-0.025 MG PO TABS: 2.0000 | ORAL_TABLET | Freq: Four times a day (QID) | ORAL | 3 refills | Status: DC

## 2019-07-19 MED ORDER — POLYETHYLENE GLYCOL 3350 17 G PO PACK: 17.0000 g | PACK | Freq: Two times a day (BID) | ORAL | 5 refills | Status: DC

## 2019-07-19 MED ORDER — LOPERAMIDE HCL 2 MG PO CAPS: 4.0000 mg | ORAL_CAPSULE | Freq: Three times a day (TID) | ORAL | 5 refills | Status: DC | PRN

## 2019-07-19 MED ORDER — HEPARIN SOD (PORK) LOCK FLUSH 100 UNIT/ML IV SOLN
250.0000 [IU] | INTRAVENOUS | Status: AC | PRN
  Administered 2019-07-19: 250 [IU]

## 2019-07-19 MED ORDER — AMLODIPINE BESYLATE 5 MG PO TABS
2.5000 mg | ORAL_TABLET | Freq: Every day | ORAL | Status: DC
  Administered 2019-07-19: 5 mg via ORAL
  Filled 2019-07-19: qty 1
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
## 2019-07-19 NOTE — Discharge Instructions (Signed)
Hold your losartan blood pressure medicine.  If you are feeling lightheaded, hold your amlodipine as well.  Drink plenty of liquids.  [place] instructions below for ostomy care.  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  Youe should be getting IV fluid boluses Monday, Wednesday, Friday for the next 6 weeks to avoid dehydration.  Ostomy Support Information  Theresia Majors heard that people get along just fine with only one of their eyes, or one of their lungs, or one of their kidneys. But you also know that you have only one intestine and only one bladder, and that leaves you feeling awfully empty, both physically and emotionally: You think no other people go around without part of their intestine with the ends of their intestines sticking out through their abdominal walls.   YOU ARE NOT ALONE.  There are nearly three quarters of a million people in the [place] who have an ostomy; people who have had surgery to remove all or part of their colons or bladders.   There is even a national association, the [place] [employer] [place] with over 350 local affiliated support groups that are organized by volunteers who provide peer support and counseling. Juan Quam has a toll free telephone num-ber, [phone] and an educational, interactive website, www.ostomy.org   An ostomy is an opening in the belly (abdominal wall) made by surgery. Ostomates are people who have had this procedure. The opening (stoma) allows the kidney or bowel to discharge waste. An external pouch covers the stoma to collect waste. Pouches are are a simple bag and are odor free. Different companies have disposable or reusable pouches to fit one's lifestyle. An ostomy can either be temporary or permanent.   THERE ARE THREE MAIN TYPES OF OSTOMIES  Colostomy. A colostomy is a surgically created opening in the large intestine (colon).  Ileostomy. An ileostomy is a surgically created opening  in the small intestine.  Urostomy. A urostomy is a surgically created opening to divert urine away from the bladder.  OSTOMY Care  The following guidelines will make care of your colostomy easier. Keep this information close by for quick reference.  Helpful DIET hints Eat a well-balanced diet including vegetables and fresh fruits. Eat on a regular schedule. Drink at least 6 to 8 glasses of fluids daily. Eat slowly in a relaxed atmosphere. Chew your food thoroughly. Avoid chewing gum, smoking, and drinking from a straw. This will help decrease the amount of air you swallow, which may help reduce gas. Eating yogurt or drinking buttermilk may help reduce gas.  To control gas at night, do not eat after 8 p.m. This will give your bowel time to quiet down before you go to bed.  If gas is a problem, you can purchase Beano. Sprinkle Beano on the first bite of food before eating to reduce gas. It has no flavor and should not change the taste of your food. You can buy Beano over the counter at your local drugstore.  Foods like fish, onions, garlic, broccoli, asparagus, and cabbage produce odor. Although your pouch is odor-proof, if you eat these foods you may notice a stronger odor when emptying your pouch. If this is a concern, you may want to limit these foods in your diet.  If you have an ileostomy, you will have chronic diarrhea & need to drink more liquids to avoid getting dehydrated.  Consider antidiarrheal medicine like imodium (loperamide) or Lomotil to help slow down bowel  movements / diarrhea into your ileostomy bag.  GETTING TO GOOD BOWEL HEALTH WITH AN ILEOSTOMY . Irregular bowel habits such as constipation and diarrhea can lead to many problems over time.  The goal: 3-6 small BOWEL MOVEMENTS A DAY!  To have soft, regular bowel movements:   Drink plenty of fluids, consider 4-6 tall glasses of water a day.    Controlling diarrheA  o Switch to liquids and simpler foods for a few days to  avoid stressing your intestines further. o Avoid dairy products (especially milk & ice cream) for a short time.  The intestines often can lose the ability to digest lactose when stressed. o Avoid foods that cause gassiness or bloating.  Typical foods include beans and other legumes, cabbage, broccoli, and dairy foods.  Every person has some sensitivity to other foods, so listen to our body and avoid those foods that trigger problems for you. o Adding fiber (Citrucel, Metamucil, psyllium, Miralax) gradually can help thicken stools by absorbing excess fluid and retrain the intestines to act more normally.  Slowly increase the dose over a few weeks.  Too much fiber too soon can backfire and cause cramping & bloating. o Probiotics (such as active yogurt, Align, etc) may help repopulate the intestines and colon with normal bacteria and calm down a sensitive digestive tract.  Most studies show it to be of mild help, though, and such products can be costly. o Medicines: - Bismuth subsalicylate (ex. Kayopectate, Pepto Bismol) every 30 minutes for up to 6 doses can help control diarrhea.  Avoid if pregnant. - Loperamide (Immodium) can slow down diarrhea.  Start with two tablets (64m total) first and then try one tablet every 6 hours.  Avoid if you are having fevers or severe pain.  If you are not better or start feeling worse, stop all medicines and call your doctor for advice o Call your doctor if you are getting worse or not better.  Sometimes further testing (cultures, endoscopy, X-ray studies, bloodwork, etc) may be needed to help diagnose and treat the cause of the diarrhea.  TROUBLESHOOTING IRREGULAR BOWELS 1) Avoid extremes of bowel movements (no bad constipation/diarrhea) 2) Miralax 17gm mixed in 8oz. water or juice-daily. May use twice a day as needed  3) Gas-x,Phazyme, etc. as needed for gas & bloating.  4) Soft,bland diet. No spicy,greasy,fried foods.  5) Prilosec (omeprazole) over-the-counter as  needed  6) May hold gluten/wheat products from diet to see if symptoms improve.  7)  May try probiotics (Align, Activa, etc) to help calm the bowels down 7) If symptoms become worse call back immediately.   Applying the pouching system To apply your pouch, follow these steps:  Place all your equipment close at hand before removing your pouch.  Wash your hands.  Stand or sit in front of a mirror. Use the position that works best for you. Remember that you must keep the skin around the stoma wrinkle-free for a good seal.  Gently remove the used pouch (1-piece system) or the pouch and old wafer (2-piece system). Empty the pouch into the toilet. Save the closure clip to use again.  Wash the stoma itself and the skin around the stoma. Your stoma may bleed a little when being washed. This is normal. Rinse and pat dry. You may use a wash cloth or soft paper towels (like Bounty), mild soap (like Dial, Safeguard, or [place], and water. Avoid soaps that contain perfumes or lotions.  For a new pouch (1-piece system) or a  new wafer (2-piece system), measure your stoma using the stoma guide in each box of supplies.  Trace the shape of your stoma onto the back of the new pouch or the back of the new wafer. Cut out the opening. Remove the paper backing and set it aside.  Optional: Apply a skin barrier powder to surrounding skin if it is irritated (bare or weeping), and dust off the excess. Optional: Apply a skin-prep wipe (such as Skin Prep or All-Kare) to the skin around the stoma, and let it dry. Do not apply this solution if the skin is irritated (red, tender, or broken) or if you have shaved around the stoma. Optional: Apply a skin barrier paste (such as Stomahesive, Coloplast, or Premium) around the opening cut in the back of the pouch or wafer. Allow it to dry for 30 to 60 seconds.  Hold the pouch (1-piece system) or wafer (2-piece system) with the sticky side toward your body. Make sure the skin  around the stoma is wrinkle-free. Center the opening on the stoma, then press firmly to your abdomen (Fig. 4). Look in the mirror to check if you are placing the pouch, or wafer, in the right position. For a 2-piece system, snap the pouch onto the wafer. Make sure it snaps into place securely.  Place your hand over the stoma and the pouch or wafer for about 30 seconds. The heat from your hand can help the pouch or wafer stick to your skin.  Add deodorant (such as Super Banish or Nullo) to your pouch. Other options include food extracts such as vanilla oil and peppermint extract. Add about 10 drops of the deodorant to the pouch. Then apply the closure clamp. Note: Do not use toxic  chemicals or commercial cleaning agents in your pouch. These substances may harm the stoma.  Optional: For extra seal, apply tape to all 4 sides around the pouch or wafer, as if you were framing a picture. You may use any brand of medical adhesive tape. Change your pouch every 5 to 7 days. Change it immediately if a leak occurs.  Wash your hands afterwards.  If you are wearing a 2-piece system, you may use 2 new pouches per week and alternate them. Rinse the pouch with mild soap and warm water and hang it to dry for the next day. Apply the fresh pouch. Alternate the 2 pouches like this for a week. After a week, change the wafer and begin with 2 new pouches. Place the old pouches in a plastic bag, and put them in the trash.    Tips for colostomy care  Applying Your Pouch You may stand or sit to apply your pouch.  Keep the skin where you apply the pouch wrinkle-free. If the skin around the pouch is wrinkled, the seal may break when your skin stretches.  If hair grows close to your stoma, you may trim off the hair with scissors, an electric razor, or a safety razor.  Always have a mirror nearby so you can get a better view of your stoma.  When you apply a new pouch, write the date on the adhesive tape. This will  remind you of when you last changed your pouch.  Changing Your Pouch The best time to change your pouch is in the morning, before eating or drinking anything. Your stoma can function at any time, but it will function more after eating or drinking.  Emptying Your Pouch Empty your pouch when it is one-third full (  of urine, stool, and/or gas). If you wait until your pouch is fuller than this, it will be more difficult to empty and more noticeable. When you empty your pouch, either put toilet paper in the toilet bowl first, or flush the toilet while you empty the pouch. This will reduce splashing. You can empty the pouch between your legs or to one side while sitting, or while standing or stooping. If you have a 2-piece system, you can snap off the pouch to empty it. Remember that your stoma may function during this time. If you wish to rinse your pouch after you empty it, a [place] baster can be helpful. When using a baster, squirt water up into the pouch through the opening at the bottom. With a 2-piece system, you can snap off the pouch to rinse it. After rinsing  your pouch, empty it into the toilet. When rinsing your pouch at home, put a few granules of Dreft soap in the rinse water. This helps lubricate and freshen your pouch. The inside of your pouch can be sprayed with non-stick cooking oil (Pam spray). This may help reduce stool sticking to the inside of the pouch.  Bathing You may shower or bathe with your pouch on or off. Remember that your stoma may function during this time.  The materials you use to wash your stoma and the skin around it should be clean, but they do not need to be sterile.  Wearing Your Pouch During hot weather, or if you perspire a lot in general, wear a cover over your pouch. This may prevent a rash on your skin under the pouch. Pouch covers are sold at ostomy supply stores. Wear the pouch inside your underwear for better support. Watch your weight. Any gain or loss  of 10 to 15 pounds or more can change the way your pouch fits.  Going Away From Home A collapsible cup (like those that come in travel kits) or a soft plastic squirt bottle with a pull-up top (like a travel bottle for shampoo) can be used for rinsing your pouch when you are away from home. Tilt the opening of the pouch at an upward angle when using a cup to rinse.  Carry wet wipes or extra tissues to use in public bathrooms.  Carry an extra pouching system with you at all times.  Never keep ostomy supplies in the glove compartment of your car. Extreme heat or cold can damage the skin barriers and adhesive wafers on the pouch.  When you travel, carry your ostomy supplies with you at all times. Keep them within easy reach. Do not pack ostomy supplies in baggage that will be checked or otherwise separated from you, because your baggage might be lost. If youre traveling out of the country, it is helpful to have a letter stating that you are carrying ostomy supplies as a medical necessity.  If you need ostomy supplies while traveling, look in the yellow pages of the telephone book under Surgical Supplies. Or call the local ostomy organization to find out where supplies are available.  Do not let your ostomy supplies get low. Always order new pouches before you use the last one.  Reducing Odor Limit foods such as broccoli, cabbage, onions, fish, and garlic in your diet to help reduce odor. Each time you empty your pouch, carefully clean the opening of the pouch, both inside and outside, with toilet paper. Rinse your pouch 1 or 2 times daily after you empty it (see directions  for emptying your pouch and going away from home). Add deodorant (such as Super Banish or Nullo) to your pouch. Use air deodorizers in your bathroom. Do not add aspirin to your pouch. Even though aspirin can help prevent odor, it could cause ulcers on your stoma.  When to call the doctor Call the doctor if you have any of  the following symptoms: Purple, black, or white stoma Severe cramps lasting more than 6 hours Severe watery discharge from the stoma lasting more than 6 hours No output from the colostomy for 3 days Excessive bleeding from your stoma Swelling of your stoma to more than 1/2-inch larger than usual Pulling inward of your stoma below skin level Severe skin irritation or deep ulcers Bulging or other changes in your abdomen  When to call your ostomy nurse Call your ostomy/enterostomal therapy (ET) nurse if any of the following occurs: Frequent leaking of your pouching system Change in size or appearance of your stoma, causing discomfort or problems with your pouch Skin rash or rawness Weight gain or loss that causes problems with your pouch      FREQUENTLY ASKED QUESTIONS   Why havent you met any of these folks who have an ostomy?  Well, maybe you have! You just did not recognize them because an ostomy doesn't show. It can be kept secret if you wish. Why, maybe some of your best friends, office associates or neighbors have an ostomy ... you never can tell.   People facing ostomy surgery have many quality-of-life questions like:  Will you bulge? Smell? Make noises? Will you feel waste leaving your body? Will you be a captive of the toilet? Will you starve? Be a social outcast? Get/stay married? Have babies? Easily bathe, go swimming, bend over?  OK, lets look at what you can expect:   Will you bulge?  Remember, without part of the intestine or bladder, and its contents, you should have a flatter tummy than before. You can expect to wear, with little exception, what you wore before surgery ... and this in-cludes tight clothing and bathing suits.   Will you smell?  Today, thanks to modern odor proof pouching systems, you can walk into an ostomy support group meeting and not smell anything that is foul or offensive. And, for those with an ileostomy or colostomy who are concerned about  odor when emptying their pouch, there are in-pouch deodorants that can be used to eliminate any waste odors that may exist.   Will you make noises?  Everyone produces gas, especially if they are an air-swallower. But intestinal sounds that occur from time to time are no differ-ent than a gurgling tummy, and quite often your clothing will muffle any sounds.   Will you feel the waste discharges?  For those with a colostomy or ileostomy there might be a slight pressure when waste leaves your body, but understand that the intestines have no nerve endings, so there will be no unpleasant sensations. Those with a urostomy will probably be unaware of any kidney drainage.   Will you be a captive of the toilet?  Immediately post-op you will spend more time in the bathroom than you will after your body recovers from surgery. Every person is different, but on average those with an ileostomy or urostomy may empty their pouches 4 to 6 times a day; a little  less if you have a colostomy. The average wear time between pouch system changes is 3 to 5 days and the changing process should  take less than 30 minutes.   Will I need to be on a special diet? Most people return to their normal diet when they have recovered from surgery. Be sure to chew your food well, eat a well-balanced diet and drink plenty of fluids. If you experience problems with a certain food, wait a couple of weeks and try it again.  Will there be odor and noises? Pouching systems are designed to be odor-proof or odor-resistant. There are deodorants that can be used in the pouch. Medications are also available to help reduce odor. Limit gas-producing foods and carbonated beverages. You will experience less gas and fewer noises as you heal from surgery.  How much time will it take to care for my ostomy? At first, you may spend a lot of time learning about your ostomy and how to take care of it. As you become more comfortable and skilled at changing  the pouching system, it will take very little time to care for it.   Will I be able to return to work? People with ostomies can perform most jobs. As soon as you have healed from surgery, you should be able to return to work. Heavy lifting (more than 10 pounds) may be discouraged.   What about intimacy? Sexual relationships and intimacy are important and fulfilling aspects of your life. They should continue after ostomy surgery. Intimacy-related concerns should be discussed openly between you and your partner.   Can I wear regular clothing? You do not need to wear special clothing. Ostomy pouches are fairly flat and barely noticeable. Elastic undergarments will not hurt the stoma or prevent the ostomy from functioning.   Can I participate in sports? An ostomy should not limit your involvement in sports. Many people with ostomies are runners, skiers, swimmers or participate in other active lifestyles. Talk with your caregiver first before doing heavy physical activity.  Will you starve?  Not if you follow doctors orders at each stage of your post-op adjustment. There is no such thing as an ostomy diet. Some people with an ostomy will be able to eat and tolerate anything; others may find diffi-culty with some foods. Each person is an individual and must determine, by trial, what is best for them. A good practice for all is to drink plenty of water.   Will you be a social outcast?  Have you met anyone who has an ostomy and is a social outcast? Why should you be the first? Only your attitude and self image will effect how you are treated. No confi-dent person is an Occupational psychologist.     PROFESSIONAL HELP  Resources are available if you need help or have questions about your ostomy.    Specially trained nurses called Wound, Ostomy Continence Nurses (WOCN) are available for consultation in most major medical centers.   Consider getting an ostomy consult at one of the outpatient ostomy clinics.   [facility] one in [place] at this time   The [employer] (UOA) is a group made up of many local chapters throughout the [place]. These local groups hold meetings and provide support to prospective and existing ostomates. They sponsor educational events and have qualified visitors to make personal or telephone visits. Contact the UOA for the chapter nearest you and for other educational publications.   More detailed information can be found in Colostomy Guide, a publication of the [employer] (UOA). Contact UOA at 1-6603593817 or visit their web site at [email or web address]/. The website contains links  to other sites, suppliers and resources.   Tree surgeon Start Services:  Start at the website to enlist for support.  Your Wound Ostomy (WOCN) nurse may have started this process. https://www.hollister.com/en/securestart  Secure Start services are designed to support people as they live their lives with an ostomy or neurogenic bladder. Enrolling is easy and at no cost to the patient. We realize that each person's needs and life journey are different. Through Secure Start services, we want to help people live their life, their way.   SURGERY: POST OP INSTRUCTIONS (Surgery for small bowel obstruction, colon resection, etc)   ######################################################################  EAT Gradually transition to a high fiber diet with a fiber supplement over the next few days after discharge  WALK Walk an hour a day.  Control your pain to do that.    CONTROL PAIN Control pain so that you can walk, sleep, tolerate sneezing/coughing, go up/down stairs.  HAVE A BOWEL MOVEMENT DAILY Keep your bowels regular to avoid problems.  OK to try a laxative to override constipation.  OK to use an antidairrheal to slow down diarrhea.  Call if not better after 2 tries  CALL IF YOU HAVE PROBLEMS/CONCERNS Call if you are still struggling despite following these  instructions. Call if you have concerns not answered by these instructions  ######################################################################   DIET Follow a light diet the first few days at home.  Start with a bland diet such as soups, liquids, starchy foods, low fat foods, etc.  If you feel full, bloated, or constipated, stay on a ful liquid or pureed/blenderized diet for a few days until you feel better and no longer constipated. Be sure to drink plenty of fluids every day to avoid getting dehydrated (feeling dizzy, not urinating, etc.). Gradually add a fiber supplement to your diet over the next week.  Gradually get back to a regular solid diet.  Avoid fast food or heavy meals the first week as you are more likely to get nauseated. It is expected for your digestive tract to need a few months to get back to normal.  It is common for your bowel movements and stools to be irregular.  You will have occasional bloating and cramping that should eventually fade away.  Until you are eating solid food normally, off all pain medications, and back to regular activities; your bowels will not be normal. Focus on eating a low-fat, high fiber diet the rest of your life (See Getting to [place], below).  CARE of your INCISION or WOUND It is good for closed incision and even open wounds to be washed every day.  Shower every day.  Short baths are fine.  Wash the incisions and wounds clean with soap & water.    If you have a closed incision(s), wash the incision with soap & water every day.  You may leave closed incisions open to air if it is dry.   You may cover the incision with clean gauze & replace it after your daily shower for comfort. If you have skin tapes (Steristrips) or skin glue (Dermabond) on your incision, leave them in place.  They will fall off on their own like a scab.  You may trim any edges that curl up with clean scissors.  If you have staples, set up an appointment for them to be  removed in the office in 10 days after surgery.  If you have a drain, wash around the skin exit site with soap & water and place a  new dressing of gauze or band aid around the skin every day.  Keep the drain site clean & dry.    If you have an open wound with packing, see wound care instructions.  In general, it is encouraged that you remove your dressing and packing, shower with soap & water, and replace your dressing once a day.  Pack the wound with clean gauze moistened with normal (0.9%) saline to keep the wound moist & uninfected.  Pressure on the dressing for 30 minutes will stop most wound bleeding.  Eventually your body will heal & pull the open wound closed over the next few months.  Raw open wounds will occasionally bleed or secrete yellow drainage until it heals closed.  Drain sites will drain a little until the drain is removed.  Even closed incisions can have mild bleeding or drainage the first few days until the skin edges scab over & seal.   If you have an open wound with a wound vac, see wound vac care instructions.     ACTIVITIES as tolerated Start light daily activities --- self-care, walking, climbing stairs-- beginning the day after surgery.  Gradually increase activities as tolerated.  Control your pain to be active.  Stop when you are tired.  Ideally, walk several times a day, eventually an hour a day.   Most people are back to most day-to-day activities in a few weeks.  It takes 4-8 weeks to get back to unrestricted, intense activity. If you can walk 30 minutes without difficulty, it is safe to try more intense activity such as jogging, treadmill, bicycling, low-impact aerobics, swimming, etc. Save the most intensive and strenuous activity for last (Usually 4-8 weeks after surgery) such as sit-ups, heavy lifting, contact sports, etc.  Refrain from any intense heavy lifting or straining until you are off narcotics for pain control.  You will have off days, but things should improve  week-by-week. DO NOT PUSH THROUGH PAIN.  Let pain be your guide: If it hurts to do something, don't do it.  Pain is your body warning you to avoid that activity for another week until the pain goes down. You may drive when you are no longer taking narcotic prescription pain medication, you can comfortably wear a seatbelt, and you can safely make sudden turns/stops to protect yourself without hesitating due to pain. You may have sexual intercourse when it is comfortable. If it hurts to do something, stop.  MEDICATIONS Take your usually prescribed home medications unless otherwise directed.   Blood thinners:  Usually you can restart any strong blood thinners after the second postoperative day.  It is OK to take aspirin right away.     If you are on strong blood thinners (warfarin/Coumadin, Plavix, Xerelto, Eliquis, Pradaxa, etc), discuss with your surgeon, medicine PCP, and/or cardiologist for instructions on when to restart the blood thinner & if blood monitoring is needed (PT/INR blood check, etc).     PAIN CONTROL Pain after surgery or related to activity is often due to strain/injury to muscle, tendon, nerves and/or incisions.  This pain is usually short-term and will improve in a few months.  To help speed the process of healing and to get back to regular activity more quickly, DO THE FOLLOWING THINGS TOGETHER: 1. Increase activity gradually.  DO NOT PUSH THROUGH PAIN 2. Use Ice and/or Heat 3. Try Gentle Massage and/or Stretching 4. Take over the counter pain medication 5. Take Narcotic prescription pain medication for more severe pain  Good pain  control = faster recovery.  It is better to take more medicine to be more active than to stay in bed all day to avoid medications. 1.  Increase activity gradually Avoid heavy lifting at first, then increase to lifting as tolerated over the next 6 weeks. Do not push through the pain.  Listen to your body and avoid positions and maneuvers than  reproduce the pain.  Wait a few days before trying something more intense Walking an hour a day is encouraged to help your body recover faster and more safely.  Start slowly and stop when getting sore.  If you can walk 30 minutes without stopping or pain, you can try more intense activity (running, jogging, aerobics, cycling, swimming, treadmill, sex, sports, weightlifting, etc.) Remember: If it hurts to do it, then dont do it! 2. Use Ice and/or Heat You will have swelling and bruising around the incisions.  This will take several weeks to resolve. Ice packs or heating pads (6-8 times a day, 30-60 minutes at a time) will help sooth soreness & bruising. Some people prefer to use ice alone, heat alone, or alternate between ice & heat.  Experiment and see what works best for you.  Consider trying ice for the first few days to help decrease swelling and bruising; then, switch to heat to help relax sore spots and speed recovery. Shower every day.  Short baths are fine.  It feels good!  Keep the incisions and wounds clean with soap & water.   3. Try Gentle Massage and/or Stretching Massage at the area of pain many times a day Stop if you feel pain - do not overdo it 4. Take over the counter pain medication This helps the muscle and nerve tissues become less irritable and calm down faster Choose ONE of the following over-the-counter anti-inflammatory medications: Acetaminophen 560m tabs (Tylenol) 1-2 pills with every meal and just before bedtime (avoid if you have liver problems or if you have acetaminophen in you narcotic prescription) Naproxen 2219mtabs (ex. Aleve, Naprosyn) 1-2 pills twice a day (avoid if you have kidney, stomach, IBD, or bleeding problems) Ibuprofen 20090mabs (ex. Advil, Motrin) 3-4 pills with every meal and just before bedtime (avoid if you have kidney, stomach, IBD, or bleeding problems) Take with food/snack several times a day as directed for at least 2 weeks to help keep pain /  soreness down & more manageable. 5. Take Narcotic prescription pain medication for more severe pain A prescription for strong pain control is often given to you upon discharge (for example: oxycodone/Percocet, hydrocodone/Norco/Vicodin, or tramadol/Ultram) Take your pain medication as prescribed. Be mindful that most narcotic prescriptions contain Tylenol (acetaminophen) as well - avoid taking too much Tylenol. If you are having problems/concerns with the prescription medicine (does not control pain, nausea, vomiting, rash, itching, etc.), please call us [phone] see if we need to switch you to a different pain medicine that will work better for you and/or control your side effects better. If you need a refill on your pain medication, you must call the office before 4 pm and on weekdays only.  By federal law, prescriptions for narcotics cannot be called into a pharmacy.  They must be filled out on paper & picked up from our office by the patient or authorized caretaker.  Prescriptions cannot be filled after 4 pm nor on weekends.    WHEN TO CALL US [phone] uncontrolled or worsening pain  Fever over 101 F (38.5 C) Concerns with  the incision: Worsening pain, redness, rash/hives, swelling, bleeding, or drainage Reactions / problems with new medications (itching, rash, hives, nausea, etc.) Nausea and/or vomiting Difficulty urinating Difficulty breathing Worsening fatigue, dizziness, lightheadedness, blurred vision Other concerns If you are not getting better after two weeks or are noticing you are getting worse, contact our office [phone] for further advice.  We may need to adjust your medications, re-evaluate you in the office, send you to the emergency room, or see what other things we can do to help. The clinic staff is available to answer your questions during regular business hours (8:30am-5pm).  Please dont hesitate to call and ask to speak to one of our nurses for  clinical concerns.    A surgeon from [facility] is always on call at the hospitals 24 hours/day If you have a medical emergency, go to the nearest emergency room or call 911.  FOLLOW UP in our office One the day of your discharge from the hospital (or the next business weekday), please call [facility] to set up or confirm an appointment to see your surgeon in the office for a follow-up appointment.  Usually it is 2-3 weeks after your surgery.   If you have skin staples at your incision(s), let the office know so we can set up a time in the office for the nurse to remove them (usually around 10 days after surgery). Make sure that you call for appointments the day of discharge (or the next business weekday) from the hospital to ensure a convenient appointment time. IF YOU HAVE DISABILITY OR FAMILY LEAVE FORMS, BRING THEM TO THE OFFICE FOR PROCESSING.  DO NOT GIVE THEM TO YOUR DOCTOR.  [facility] [address] [place], [place], [place]  [address] ? [phone] - Main [phone] - [place],  [phone] - Fax [email or web address]  GETTING TO GOOD BOWEL HEALTH. It is expected for your digestive tract to need a few months to get back to normal.  It is common for your bowel movements and stools to be irregular.  You will have occasional bloating and cramping that should eventually fade away.  Until you are eating solid food normally, off all pain medications, and back to regular activities; your bowels will not be normal.   Avoiding constipation The goal: ONE SOFT BOWEL MOVEMENT A DAY!    Drink plenty of fluids.  Choose water first. TAKE A FIBER SUPPLEMENT EVERY DAY THE REST OF YOUR LIFE During your first week back home, gradually add back a fiber supplement every day Experiment which form you can tolerate.   There are many forms such as powders, tablets, wafers, gummies, etc Psyllium bran (Metamucil), methylcellulose  (Citrucel), Miralax or Glycolax, Benefiber, Flax Seed.  Adjust the dose week-by-week (1/2 dose/day to 6 doses a day) until you are moving your bowels 1-2 times a day.  Cut back the dose or try a different fiber product if it is giving you problems such as diarrhea or bloating. Sometimes a laxative is needed to help jump-start bowels if constipated until the fiber supplement can help regulate your bowels.  If you are tolerating eating & you are farting, it is okay to try a gentle laxative such as double dose MiraLax, prune juice, or Milk of Magnesia.  Avoid using laxatives too often. Stool softeners can sometimes help counteract the constipating effects of narcotic pain medicines.  It can also cause diarrhea, so avoid using for too long. If you are still  constipated despite taking fiber daily, eating solids, and a few doses of laxatives, call our office. Controlling diarrhea Try drinking liquids and eating bland foods for a few days to avoid stressing your intestines further. Avoid dairy products (especially milk & ice cream) for a short time.  The intestines often can lose the ability to digest lactose when stressed. Avoid foods that cause gassiness or bloating.  Typical foods include beans and other legumes, cabbage, broccoli, and dairy foods.  Avoid greasy, spicy, fast foods.  Every person has some sensitivity to other foods, so listen to your body and avoid those foods that trigger problems for you. Probiotics (such as active yogurt, Align, etc) may help repopulate the intestines and colon with normal bacteria and calm down a sensitive digestive tract Adding a fiber supplement gradually can help thicken stools by absorbing excess fluid and retrain the intestines to act more normally.  Slowly increase the dose over a few weeks.  Too much fiber too soon can backfire and cause cramping & bloating. It is okay to try and slow down diarrhea with a few doses of antidiarrheal medicines.   Bismuth  subsalicylate (ex. Kayopectate, Pepto Bismol) for a few doses can help control diarrhea.  Avoid if pregnant.   Loperamide (Imodium) can slow down diarrhea.  Start with one tablet (61m) first.  Avoid if you are having fevers or severe pain.  ILEOSTOMY PATIENTS WILL HAVE CHRONIC DIARRHEA since their colon is not in use.    Drink plenty of liquids.  You will need to drink even more glasses of water/liquid a day to avoid getting dehydrated. Record output from your ileostomy.  Expect to empty the bag every 3-4 hours at first.  Most people with a permanent ileostomy empty their bag 4-6 times at the least.   Use antidiarrheal medicine (especially Imodium) several times a day to avoid getting dehydrated.  Start with a dose at bedtime & breakfast.  Adjust up or down as needed.  Increase antidiarrheal medications as directed to avoid emptying the bag more than 8 times a day (every 3 hours). Work with your wound ostomy nurse to learn care for your ostomy.  See ostomy care instructions. TROUBLESHOOTING IRREGULAR BOWELS 1) Start with a soft & bland diet. No spicy, greasy, or fried foods.  2) Avoid gluten/wheat or dairy products from diet to see if symptoms improve. 3) Miralax 17gm or flax seed mixed in 8Boyle water or juice-daily. May use 2-4 times a day as needed. 4) Gas-X, Phazyme, etc. as needed for gas & bloating.  5) Prilosec (omeprazole) over-the-counter as needed 6)  Consider probiotics (Align, Activa, etc) to help calm the bowels down  Call your doctor if you are getting worse or not getting better.  Sometimes further testing (cultures, endoscopy, X-ray studies, CT scans, bloodwork, etc.) may be needed to help diagnose and treat the cause of the diarrhea. [facility] [place]  [phone] Main.    [phone] - Toll Free.   [phone] Fax [email or web address]   Ileostomy Nutrition Therapy   During ileostomy  surgery, the entire colon, rectum, and anus are removed or bypassed. The part of the small intestine called the ileum is brought through the abdominal wall, creating a stoma. The stoma is an opening in the abdomen that must be covered with a bag to collect stools at all times. It can be temporary or permanent depending on the surgery.   After  surgery, your bowel will be swollen. Your eating plan will begin with a diet of clear liquids. As you recover, you will start eating solid foods, beginning with foods that are low in fiber (see Recommended Foods). You should avoid high-fiber foods because they are harder to digest. Avoiding high-fiber foods will allow the bowel to heal and prevent blockage of the ileostomy.  You should have less than 8 grams of fiber per day when you move from liquids to solid foods and then transition to less than 13 grams of fiber for the whole day in the next few days as your symptoms decrease. Most patients begin to eat more normally 6 weeks after surgery.  The changes to your diet recommended on this handout can help reduce symptoms such as diarrhea, odor, and gas; help you avoid a blockage; and help your body get more nutrients from your food as you heal from surgery.   Tips   You may not feel like eating much, so eat small amounts every 2 to 4 hours. Keep a regular schedule for meals and snacks to help reduce gas and help your body absorb nutrients from foods.   Let your health care provider know if you see whole foods or pills in your ostomy bag.   Do not use time-released, enteric-coated medications or very large tablets. Also avoid laxatives, which can cause dehydration.   Take a chewable (non-gummy) multivitamin with minerals daily.   Take a chewable or liquid calcium supplement. Liquid calcium citrate can be taken with food or without food.   Have your largest meal in the middle of the day. Do not eat large amounts in the evening. This can help decrease stool  output at night, so that you can limit emptying the ostomy bag then.   Eat foods that may thicken stool several times a day. Some foods can change the color of the stool. (See the Food Selection Guide.)   When you begin to add more variety back into your diet, add only 1 new food every few days. If there are foods that bothered you before surgery, add other foods first. Eat only a small amount when you re-try foods. If a food makes you feel sick, wait a few weeks and then re-try it. Keep a log of foods you try and how you feel when eating them.   To reduce gas, avoid chewing gum, drinking with straws and drinking carbonated beverages, smoking or chewing tobacco, eating too fast, and skipping meals. Missing meals can increase gas and watery stools.   Some foods may cause blockages (see Food Selection Guide). Use caution when eating these foods. Eat small amounts and chew your foods well to prevent blockage.   Get enough fluids. Aim for at least 8 to 10 cups of liquid per day. Drink liquids 30 minutes after meals or snacks to avoid flushing foods through your system too quickly.   During times of higher output (1,800 milliliters per day or more) or heavy sweating, you need to drink more fluids. You may need to actually measure how much you are drinking and your output from your ostomy when it is high:  o 1 ounce is 30 milliliters  o 1 cup is 8 ounces  o 8 cups is 64 ounces or 2 quarts or 2 liters   Watch for signs and symptoms of fluid-electrolyte imbalance. If symptoms occur, seek treatment right away. Symptoms include:  o Dry mouth  o Reduced urine output (not as much urine  or urinating less often than normal)  o Dark-colored urine  o Feeling dizzy when you stand up  o Noticeable fatigue (feeling extremely tired)  o Abdominal cramping  If you have a high output ostomy, you may need to use an oral rehydration solution (ORS) to replace fluid loss. The World Health Organization has a solution in  powder form you can buy (called Oral Rehydration Salts). Some sports drinks can increase stoma output, so pediatric electrolyte solutions, such as Pedialyte, are recommended instead. A less expensive option is to make the oral rehydration solution yourself using one of the following recipes:  o 2 cups Gatorade + 2 cups water +  teaspoon salt  o 3 cups water + 1 cup orange juice +  teaspoon salt +  teaspoon baking soda  o  cup grape juice or cranberry juice + 3 cups water +  teaspoon salt  o 1 cup apple juice + 3 cups water +  teaspoon salt  o 4 cups (1 liter) water +  teaspoon table salt + 6 level teaspoons sugar ([place] ORS recipe)  Your registered dietitian nutritionist or doctor may suggest increasing foods that are higher in sodium and potassium. Remember to choose lower fiber foods that are high in potassium. Some examples of high-potassium foods include soy milk, yogurt, cottage cheese, potatoes without skin, liquid supplements (such as Ensure Muscle Health), orange juice or tomato juice (if these do not cause reflux or other symptoms), smooth peanut butter, and baked or broiled salmon or [place]. Salt substitute that contains potassium chloride can also be used, but be careful not to overuse it. Higher-sodium foods added to the diet should also be lower fiber and not fried.   Recommended Foods  If particular foods make you feel unwell, stop eating them and try again in 2 to 3 weeks. Everyones tolerance to foods is different. Finding foods that are best for you may require some trial and error.   Recommended Foods Dairy Foods    Fat-free (skim) or low-fat (1%) milk*  Soy milk, rice milk, or almond milk  Lactose-free milk  Yogurt*  Powdered milk  Cheese*  Buttermilk  Low-fat ice cream* or sherbet    Protein Foods    Very tender, well-cooked meats and poultry prepared without added fat  Fish  Smooth nut butter (limit to 1 to 2 tablespoons a day)  Eggs  (scrambled eggs are easiest to digest)    Grains    Grain foods made from white or refined flour, including bread, bagels, rolls; crackers; pasta; and cereal  White rice  Cream of wheat or cream of rice  Refined grits  Cereals made from refined grains, without added fiber, such as rice chex or cornflakes    Vegetables     Well-cooked vegetables without seeds or skins, such as green beans or carrots  Potatoes without skin  Shredded lettuce on sandwich  Strained vegetable juice    Fruits     Pulp-free fruit juices (except prune juice)  Ripe banana  Soft melons (watermelon or honeydew)  Peeled and cooked apple  Canned fruits (except pineapple), but avoid heavy syrup, which can make diarrhea worse  These can be added later with doctors OK to increase fiber:    Avocado   Orange or grapefruit without membrane        Beverages    Water  Decaffeinated coffee or tea  Noncarbonated beverages  Rehydration beverages    Foods Not Recommended   When  you first start eating solid foods, avoid foods that are high in fiber, such as whole grains, dried beans, and most raw vegetables and fruits. (See Foods Recommended list for low-fiber foods. See Food Selection Guide lists for foods that cause symptoms.)    Avoid acidic, spicy, fried, and greasy foods and foods high in sugar.   Limit food and drinks that contain sugar substitutes. Often diluted sugar-containing drinks do better. Cut fruit juice in half by adding an equal amount of water or more.   While you heal, avoid any foods that cause you to have odor, gas, diarrhea, or obstruction.   You should not have any salad or raw vegetables.   If you have kidney stones, you should try to avoid foods that are high in oxalate. Your RDN may give you a more detailed list of high-oxalate foods if you are at risk. Some foods that are high in oxalate include:  o Grains: Wheat bran, wheat germ, whole wheat flour  o Protein Foods: Beans, tofu,  nuts  o Vegetables: Beets, dark leafy greens, sweet potato  Beverages: Beer, cocoa, instant tea, instant coffee  o Other: Carob, chocolate   Food Selection Guide for Ileostomy Patients   Foods That May Cause Blockage  Apples, unpeeled  Bean sprouts  Cabbage, raw  Casing on sausage  Celery  Chinese vegetables  Coconut  Coleslaw  Corn  Cucumbers  Dried fruit, raisins  Grapes  Green peppers  Mushrooms  Nuts  Peas  Pickles  Pineapple  Popcorn  Relishes and olives  Salad greens  Seeds and nuts  Spinach  Tough, fibrous meats (for example, steak on grill)  Vegetable and fruit skins  Whole grains   Foods That May Cause Gas or Odor  Alcohol  Apples  Asparagus  Bananas  Beer  Broccoli  Brussels sprouts  Cabbage  Carbonated beverages  Cauliflower  Cheese, some types  Corn  Cucumber  Dairy products  Dried beans and peas  Eggs  Fatty foods  Fish  Grapes  Green pepper  Melons  Onions  Peanuts  Prunes  Radishes  Turnips   Foods That May Help Relieve Gas and Odor  Buttermilk  Cranberry juice  Parsley  Yogurt with active cultures   Diarrhea  Initially after surgery, the stool will be liquid and watery because of where the ileostomy is located in the intestine. The stool will gradually become thicker over the next few weeks (more like a consistency of pudding or oatmeal). There are some foods that can help make the stool thicker or thinner, but if changes to your diet do not improve stools, then your doctor may be able to recommend some medications to help.  Foods That May Cause Diarrhea (looser or more frequent stool)  Alcohol (including beer)  Apricots (and stone fruits)  Beans, baked or legumes  Bran  Broccoli  Brussels sprouts  Cabbage  Caffeinated drinks (especially hot)  Chocolate  Corn  Fried meats, fish poultry  Fruit juice: apple, grape, orange  Fruit: fresh, canned, or dried  Glucose-free foods containing mannitol or sorbitol  Gum, sugar  free  High-fat foods  High-sugar foods  Licorice  Milk and dairy foods  Nuts or seeds  Peaches (stone fruit)  Peas  Plums (stone fruit)  Prune juice or prunes  Soup  Spicy foods  Sugar-free substitutes  Tomatoes  Turnip greens/green leafy vegetables, raw  Wheat/whole grains  Wine  Foods That May Help Thicken Stool  Applesauce  Bananas  Barley (  when OK to have fiber)  Cheese  Marshmallows  Oatmeal (when OK to have fiber)  Pasta (sauces may increase symptoms)  Peanut butter, creamy  Potatoes, no skin  Pretzels  Saltines  Tapioca  White bread (not high fiber)  White rice, boiled  Yogurt  Foods That May Discolor Stool  Turn stool red  Beets  Foods with red dye  Darken stool  Asparagus  Broccoli  Spinach   -From the Academy of Nutrition and Dietetics

## 2019-07-19 NOTE — Progress Notes (Signed)
Assessment unchanged. Pt verbalized understanding of dc instructions through teach back. HH to follow at home. Discharged via wc to front entrance accompanied by husband and nt.

## 2019-07-20 DIAGNOSIS — E782 Mixed hyperlipidemia: Secondary | ICD-10-CM | POA: Diagnosis not present

## 2019-07-20 DIAGNOSIS — C20 Malignant neoplasm of rectum: Secondary | ICD-10-CM | POA: Diagnosis not present

## 2019-07-20 DIAGNOSIS — E559 Vitamin D deficiency, unspecified: Secondary | ICD-10-CM | POA: Diagnosis not present

## 2019-07-20 DIAGNOSIS — Z432 Encounter for attention to ileostomy: Secondary | ICD-10-CM | POA: Diagnosis not present

## 2019-07-20 DIAGNOSIS — Z87891 Personal history of nicotine dependence: Secondary | ICD-10-CM | POA: Diagnosis not present

## 2019-07-20 DIAGNOSIS — I10 Essential (primary) hypertension: Secondary | ICD-10-CM | POA: Diagnosis not present

## 2019-07-20 DIAGNOSIS — E039 Hypothyroidism, unspecified: Secondary | ICD-10-CM | POA: Diagnosis not present

## 2019-07-20 DIAGNOSIS — F411 Generalized anxiety disorder: Secondary | ICD-10-CM | POA: Diagnosis not present

## 2019-07-20 DIAGNOSIS — Z483 Aftercare following surgery for neoplasm: Secondary | ICD-10-CM | POA: Diagnosis not present

## 2019-07-20 DIAGNOSIS — Z452 Encounter for adjustment and management of vascular access device: Secondary | ICD-10-CM | POA: Diagnosis not present

## 2019-07-20 DIAGNOSIS — K219 Gastro-esophageal reflux disease without esophagitis: Secondary | ICD-10-CM | POA: Diagnosis not present

## 2019-07-30 DIAGNOSIS — K219 Gastro-esophageal reflux disease without esophagitis: Secondary | ICD-10-CM | POA: Diagnosis not present

## 2019-07-30 DIAGNOSIS — Z452 Encounter for adjustment and management of vascular access device: Secondary | ICD-10-CM | POA: Diagnosis not present

## 2019-07-30 DIAGNOSIS — E782 Mixed hyperlipidemia: Secondary | ICD-10-CM | POA: Diagnosis not present

## 2019-07-30 DIAGNOSIS — E039 Hypothyroidism, unspecified: Secondary | ICD-10-CM | POA: Diagnosis not present

## 2019-07-30 DIAGNOSIS — I10 Essential (primary) hypertension: Secondary | ICD-10-CM | POA: Diagnosis not present

## 2019-07-30 DIAGNOSIS — Z483 Aftercare following surgery for neoplasm: Secondary | ICD-10-CM | POA: Diagnosis not present

## 2019-07-30 DIAGNOSIS — F411 Generalized anxiety disorder: Secondary | ICD-10-CM | POA: Diagnosis not present

## 2019-07-30 DIAGNOSIS — Z87891 Personal history of nicotine dependence: Secondary | ICD-10-CM | POA: Diagnosis not present

## 2019-07-30 DIAGNOSIS — E559 Vitamin D deficiency, unspecified: Secondary | ICD-10-CM | POA: Diagnosis not present

## 2019-07-30 DIAGNOSIS — C20 Malignant neoplasm of rectum: Secondary | ICD-10-CM | POA: Diagnosis not present

## 2019-07-30 DIAGNOSIS — Z432 Encounter for attention to ileostomy: Secondary | ICD-10-CM | POA: Diagnosis not present

## 2019-08-01 ENCOUNTER — Inpatient Hospital Stay: Payer: PPO | Attending: Oncology | Admitting: Oncology

## 2019-08-01 ENCOUNTER — Other Ambulatory Visit: Payer: Self-pay

## 2019-08-01 VITALS — BP 146/80 | HR 112 | Temp 98.7°F | Resp 18 | Ht 67.0 in | Wt 126.9 lb

## 2019-08-01 DIAGNOSIS — I10 Essential (primary) hypertension: Secondary | ICD-10-CM | POA: Insufficient documentation

## 2019-08-01 DIAGNOSIS — Z9221 Personal history of antineoplastic chemotherapy: Secondary | ICD-10-CM | POA: Diagnosis not present

## 2019-08-01 DIAGNOSIS — Z923 Personal history of irradiation: Secondary | ICD-10-CM | POA: Diagnosis not present

## 2019-08-01 DIAGNOSIS — R918 Other nonspecific abnormal finding of lung field: Secondary | ICD-10-CM | POA: Diagnosis not present

## 2019-08-01 DIAGNOSIS — C20 Malignant neoplasm of rectum: Secondary | ICD-10-CM | POA: Diagnosis not present

## 2019-08-01 DIAGNOSIS — F419 Anxiety disorder, unspecified: Secondary | ICD-10-CM | POA: Diagnosis not present

## 2019-08-01 DIAGNOSIS — E860 Dehydration: Secondary | ICD-10-CM | POA: Insufficient documentation

## 2019-08-01 NOTE — Progress Notes (Signed)
[facility] OFFICE PROGRESS NOTE   Diagnosis: Rectal cancer  INTERVAL HISTORY:   Kathleen Flores underwent a low anterior resection with coloanal anastomosis, diverting loop ileostomy, and ileocecectomy on 07/12/2019.  Tumor was noted at the mid/low rectum with no evidence of metastatic disease.  The anastomosis is at 1-2 cm from the anal verge.  Ileal thickening was noted close to the terminal ileum.  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  Tumor invaded into the colorectal adipose.  Negative resection margins.  Metastatic carcinoma was noted in 1 of 22 lymph nodes.  There was a satellite nodule.  The small intestine resection revealed ischemic change and ulcer.  The rectal tumor was noted to have treatment effect present (TRS 2).  No lymphovascular perineural invasion.  She reports feeling "weak ".  She is taking iron twice daily and Lomotil 4 times per day.  She empties the ileostomy back up to 8 times per day, but the bag is not full.  She is eating and drinking.  She receives home IV fluids 3 days/week.  She is taking MiraLAX twice daily.   Objective:  Vital signs in last 24 hours:  Blood pressure (!) 146/80, pulse (!) 112, temperature 98.7 F (37.1 C), temperature source Oral, resp. rate 18, height 5\' 7"  (1.702 m), weight 126 lb 14.4 oz (57.6 kg), SpO2 100 %.    HEENT: No thrush, mild dryness of the buccal mucosa Resp: Clear bilaterally Cardio: Regular rhythm, tachycardia GI: Soft, healed surgical incisions, right abdomen ileostomy with liquid stool Vascular: No leg edema  Skin: Diminished skin turgor    Lab Results:  Lab Results  Component Value Date   WBC 7.1 07/16/2019   HGB 10.2 (L) 07/16/2019   HCT 31.2 (L) 07/16/2019   MCV 95.4 07/16/2019   PLT 273 07/16/2019   NEUTROABS 2.7 04/25/2019    CMP  Lab Results  Component Value Date   NA 128 (L) 07/18/2019   K 3.8 07/19/2019   CL 89 (L) 07/18/2019   CO2  31 07/18/2019   GLUCOSE 121 (H) 07/18/2019   BUN 10 07/18/2019   CREATININE 0.50 07/19/2019   CALCIUM 8.5 (L) 07/18/2019   PROT 7.1 04/25/2019   ALBUMIN 3.4 (L) 04/25/2019   AST 15 04/25/2019   ALT 11 04/25/2019   ALKPHOS 80 04/25/2019   BILITOT 0.8 04/25/2019   GFRNONAA >60 07/19/2019   GFRAA >60 07/19/2019     Medications: I have reviewed the patient's current medications.   Assessment/Plan: 1. Rectal cancer ? Nonobstructing mass in the mid rectum on colonoscopy 02/28/2019, 7 cm from the anal verge, biopsy confirmed adenocarcinoma-at least intramucosal ? CTs 03/05/2019- eccentric soft tissue thickening at the low rectum, multiple tiny lung nodules-nonspecific, 1 cm left adrenal nodule, no definite evidence of metastatic disease ? MR pelvis 03/08/2019- tumor at 6.5 cm from the internal anal sphincter, T3b,N1(single 7 mm right perirectal lymph node) ? Radiation/Xeloda 03/19/2019-04/27/2019, Xeloda completed 04/25/2019 ? CT abdomen/pelvis 06/22/2019- dilated ileum with diffuse wall thickening and mucosal enhancement, no rectal tumor seen, 0.9 cm right lower quadrant ileocolic node, no pelvic or inguinal adenopathy ? Low anterior resection, ileocecectomy, and diverting loop ileostomy 07/12/2019,[id number] well-differentiated adenocarcinoma of the rectum, 1/22 lymph nodes positive, one tumor deposit, negative margins 2. Hypertension 3. Anxiety   Disposition: Kathleen Flores is recovering from the low anterior resection.  She has a high output ileostomy and appears to have mild dehydration today.  She will continue home IV fluids  and management of the ileostomy by Dr. Johney Maine.  I will contact Dr. gross regarding his recommendation on continuing MiraLAX.  I reviewed details of the surgical pathology report with Kathleen Flores.  I recommend she complete additional systemic therapy.  We discussed the expected benefit with the addition of oxaliplatin.  She understands the small absolute predicted benefit of  oxaliplatin.  The benefit with oxaliplatin is less clear in her age group and her performance status is borderline to receive FOLFOX at present.  She prefers proceeding with a course of adjuvant capecitabine.  She will return for an office visit and repeat assessment in 2 weeks.  We will consider beginning capecitabine after the next office visit. 25 minutes were spent with the patient today.  The majority of the time was used for counseling and coordination of care.  Betsy Coder, MD  08/01/2019  11:21 AM

## 2019-08-02 ENCOUNTER — Telehealth: Payer: Self-pay | Admitting: Oncology

## 2019-08-02 NOTE — Telephone Encounter (Signed)
Called and spoke with patient. Confirmed 9/1 app

## 2019-08-06 DIAGNOSIS — Z483 Aftercare following surgery for neoplasm: Secondary | ICD-10-CM | POA: Diagnosis not present

## 2019-08-07 ENCOUNTER — Other Ambulatory Visit (HOSPITAL_COMMUNITY)
Admission: RE | Admit: 2019-08-07 | Discharge: 2019-08-07 | Disposition: A | Discharge status: Home / Self Care | Payer: PPO | Source: Ambulatory Visit | Attending: Oncology | Admitting: Oncology

## 2019-08-07 DIAGNOSIS — C189 Malignant neoplasm of colon, unspecified: Secondary | ICD-10-CM | POA: Insufficient documentation

## 2019-08-14 ENCOUNTER — Inpatient Hospital Stay: Payer: PPO

## 2019-08-14 ENCOUNTER — Other Ambulatory Visit: Payer: Self-pay

## 2019-08-14 ENCOUNTER — Telehealth: Payer: Self-pay | Admitting: Nurse Practitioner

## 2019-08-14 ENCOUNTER — Inpatient Hospital Stay: Payer: PPO | Attending: Nurse Practitioner | Admitting: Nurse Practitioner

## 2019-08-14 ENCOUNTER — Encounter: Payer: Self-pay | Admitting: Nurse Practitioner

## 2019-08-14 VITALS — BP 136/79 | HR 127 | Temp 98.7°F | Resp 17 | Ht 67.0 in | Wt 127.6 lb

## 2019-08-14 DIAGNOSIS — Z79899 Other long term (current) drug therapy: Secondary | ICD-10-CM | POA: Diagnosis not present

## 2019-08-14 DIAGNOSIS — Z932 Ileostomy status: Secondary | ICD-10-CM | POA: Diagnosis not present

## 2019-08-14 DIAGNOSIS — F419 Anxiety disorder, unspecified: Secondary | ICD-10-CM | POA: Insufficient documentation

## 2019-08-14 DIAGNOSIS — R198 Other specified symptoms and signs involving the digestive system and abdomen: Secondary | ICD-10-CM | POA: Diagnosis not present

## 2019-08-14 DIAGNOSIS — C20 Malignant neoplasm of rectum: Secondary | ICD-10-CM | POA: Insufficient documentation

## 2019-08-14 DIAGNOSIS — I10 Essential (primary) hypertension: Secondary | ICD-10-CM | POA: Diagnosis not present

## 2019-08-14 DIAGNOSIS — R000 Tachycardia, unspecified: Secondary | ICD-10-CM | POA: Diagnosis not present

## 2019-08-14 LAB — CBC WITH DIFFERENTIAL (CANCER CENTER ONLY)
Abs Immature Granulocytes: 0.01 10*3/uL (ref 0.00–0.07)
Basophils Absolute: 0 10*3/uL (ref 0.0–0.1)
Basophils Relative: 0 %
Eosinophils Absolute: 0.1 10*3/uL (ref 0.0–0.5)
Eosinophils Relative: 1 %
HCT: 35.6 % — ABNORMAL LOW (ref 36.0–46.0)
Hemoglobin: 11.5 g/dL — ABNORMAL LOW (ref 12.0–15.0)
Immature Granulocytes: 0 %
Lymphocytes Relative: 10 %
Lymphs Abs: 0.7 10*3/uL (ref 0.7–4.0)
MCH: 28.3 pg (ref 26.0–34.0)
MCHC: 32.3 g/dL (ref 30.0–36.0)
MCV: 87.5 fL (ref 80.0–100.0)
Monocytes Absolute: 0.4 10*3/uL (ref 0.1–1.0)
Monocytes Relative: 7 %
Neutro Abs: 5.3 10*3/uL (ref 1.7–7.7)
Neutrophils Relative %: 82 %
Platelet Count: 569 10*3/uL — ABNORMAL HIGH (ref 150–400)
RBC: 4.07 MIL/uL (ref 3.87–5.11)
RDW: 12.1 % (ref 11.5–15.5)
WBC Count: 6.6 10*3/uL (ref 4.0–10.5)
nRBC: 0 % (ref 0.0–0.2)

## 2019-08-14 LAB — CMP (CANCER CENTER ONLY)
ALT: 36 U/L (ref 0–44)
AST: 24 U/L (ref 15–41)
Albumin: 3.4 g/dL — ABNORMAL LOW (ref 3.5–5.0)
Alkaline Phosphatase: 173 U/L — ABNORMAL HIGH (ref 38–126)
Anion gap: 9 (ref 5–15)
BUN: 13 mg/dL (ref 8–23)
CO2: 29 mmol/L (ref 22–32)
Calcium: 9.9 mg/dL (ref 8.9–10.3)
Chloride: 95 mmol/L — ABNORMAL LOW (ref 98–111)
Creatinine: 0.7 mg/dL (ref 0.44–1.00)
GFR, Est AFR Am: >60 mL/min (ref >60)
GFR, Estimated: >60 mL/min (ref >60)
Glucose, Bld: 123 mg/dL — ABNORMAL HIGH (ref 70–99)
Potassium: 4.5 mmol/L (ref 3.5–5.1)
Sodium: 133 mmol/L — ABNORMAL LOW (ref 135–145)
Total Bilirubin: 0.2 mg/dL — ABNORMAL LOW (ref 0.3–1.2)
Total Protein: 8.1 g/dL (ref 6.5–8.1)

## 2019-08-14 LAB — MAGNESIUM: Magnesium: 1.8 mg/dL (ref 1.7–2.4)

## 2019-08-14 NOTE — Telephone Encounter (Signed)
Called and spoke with patient. Confirmed appt  °

## 2019-08-14 NOTE — Progress Notes (Addendum)
[facility] OFFICE PROGRESS NOTE   Diagnosis: Rectal cancer  INTERVAL HISTORY:   Ms. Fredricks returns as scheduled.  Energy level is poor.  She continues to have significant output from the ileostomy.  The output tends to decrease in the late afternoon hours.  She takes MiraLAX and Lomotil daily.  She notes stool consistency is "pudding like" late in the day.  No nausea or vomiting.  Appetite is "fair".  She continues IV fluids on a Monday Wednesday Friday schedule.  Objective:  Vital signs in last 24 hours:  Blood pressure (!) 148/77, pulse (!) 116, temperature 98.7 F (37.1 C), resp. rate 17, height 5\' 7"  (1.702 m), weight 127 lb 9.6 oz (57.9 kg), SpO2 100 %.    HEENT: Tongue appears somewhat dry. Cardio: Regular, tachycardic. GI: Abdomen soft and nontender.  No hepatomegaly.  Right abdomen ileostomy. Vascular: No leg edema. Neuro: Alert and oriented. Skin: Decreased skin turgor.   Lab Results:  Lab Results  Component Value Date   WBC 7.1 07/16/2019   HGB 10.2 (L) 07/16/2019   HCT 31.2 (L) 07/16/2019   MCV 95.4 07/16/2019   PLT 273 07/16/2019   NEUTROABS 2.7 04/25/2019    Imaging:  No results found.  Medications: I have reviewed the patient's current medications.  Assessment/Plan: 1. Rectal cancer ? Nonobstructing mass in the mid rectum on colonoscopy 02/28/2019, 7 cm from the anal verge, biopsy confirmed adenocarcinoma-at least intramucosal ? CTs 03/05/2019-eccentric soft tissue thickening at the low rectum, multiple tiny lung nodules-nonspecific, 1 cm left adrenal nodule, no definite evidence of metastatic disease ? MR pelvis 03/08/2019-tumor at 6.5 cm from the internal anal sphincter, T3b,N1(single 7 mm right perirectal lymph node) ? Radiation/Xeloda 03/19/2019-04/27/2019, Xeloda completed 04/25/2019 ? CT abdomen/pelvis 06/22/2019- dilated ileum with diffuse wall thickening and mucosal enhancement, no rectal tumor seen, 0.9 cm right lower quadrant  ileocolic node, no pelvic or inguinal adenopathy ? Low anterior resection, ileocecectomy, and diverting loop ileostomy 07/12/2019,[id number] well-differentiated adenocarcinoma of the rectum, 1/22 lymph nodes positive, one tumor deposit, negative margins 2. Hypertension 3. Anxiety  Disposition: Ms. Deshotel appears unchanged.  Energy level remains poor.  She has a high output ileostomy.  She is likely mildly dehydrated.  She is currently receiving IV fluids on a Monday Wednesday Friday schedule.  She is taking MiraLAX and Lomotil.  We will contact Dr. Johney Maine regarding increasing IV fluids to daily or every other day and discontinuing MiraLAX.  We discussed beginning adjuvant Xeloda.  She feels she is ready to begin treatment.  We reviewed potential toxicities including mouth sores, diarrhea, increase sensitivity to sun, skin rash, hand-foot syndrome.  She agrees to proceed.  She will begin cycle 1 on 08/20/2019.  She understands to discontinue Xeloda and contact the office if the diarrhea worsens.  We are obtaining baseline labs today to include a CBC and chemistry panel.  She will return for lab and follow-up on 09/03/2019.  She will contact the office in the interim as outlined above or with any other problems.  Patient seen with Dr. Benay Spice.  25 minutes were spent face-to-face at today's visit with the majority of that time involved in counseling/coordination of care.    Ned Card ANP/GNP-BC   08/14/2019  12:32 PM  This was a shared visit with Ned Card.  Ms. Ozog was interviewed and examined.  She continues to have loose stool and empties the ileostomy bag frequently.  She receives IV fluids 3 days/week.  I will discuss increasing the IV  fluids with Dr. Johney Maine.  We will check a chemistry panel today.  The plan is to begin adjuvant Xeloda 08/20/2019.  We reviewed potential toxicities associated with Xeloda.  She agrees to proceed.  She will discontinue Xeloda and contact us for increased  diarrhea.    [name], MD

## 2019-08-15 ENCOUNTER — Telehealth: Payer: Self-pay | Admitting: Pharmacist

## 2019-08-15 ENCOUNTER — Telehealth: Payer: Self-pay | Admitting: *Deleted

## 2019-08-15 DIAGNOSIS — C20 Malignant neoplasm of rectum: Secondary | ICD-10-CM

## 2019-08-15 MED ORDER — CAPECITABINE 500 MG PO TABS: ORAL_TABLET | ORAL | 0 refills | Status: DC

## 2019-08-15 MED ORDER — CAPECITABINE 500 MG PO TABS: 1500.0000 mg | ORAL_TABLET | Freq: Two times a day (BID) | ORAL | 0 refills | Status: DC

## 2019-08-15 NOTE — Telephone Encounter (Signed)
Per discussion by Dr. Benay Spice and Dr. Johney Maine: called patient and instructed her to decrease her Miralax to daily and [facility] nurse will be arranging for her IVF to increase to 2 liters NS every MWF X 4 weeks.

## 2019-08-15 NOTE — Telephone Encounter (Addendum)
Oral Chemotherapy Pharmacist Encounter   I spoke with patient for overview of: Xeloda (capecitabine) for the adjuvant treatment of early stage rectal cancer, planned duration 4-6 months of adjuvant therapy.  Counseled patient on administration, dosing, side effects, monitoring, drug-food interactions, safe handling, storage, and disposal.  Patient will take Xeloda 500mg  tablets, 3 tablets (1500mg ) by mouth in AM and 2 tabs (1000mg ) by mouth in PM, within 30 minutes of finishing meals, on days 1-14 of each 21 day cycle.  She understands her Xeloda dose may increase for cycle 2 and onward of she tolerates cycle 1 very well.  Xeloda start date: 08/20/2019  Adverse effects include but are not limited to: fatigue, decreased blood counts, GI upset, diarrhea, mouth sores, and hand-foot syndrome.  Patient has anti-emetic on hand and knows to take it if nausea develops.   She will take 1/2 prochlorperazine tablet, she states 10mg  is "too strong" for her. She will alert the office if she needs a different anti-emetic.  She will alert the office to significant changes in ileostomy output.  Reviewed with patient importance of keeping a medication schedule and plan for any missed doses.  Medication reconciliation performed and medication/allergy list updated.  Insurance authorization for Xeloda was not required at this time. Test claim at the pharmacy revealed copayment $0 for 1st fill of Xeloda. This will ship from the [facility] on 08/16/19 to deliver to patient's home on 9/4.  Patient informed the pharmacy will reach out 5-7 days prior to needing next fill of Xeloda to coordinate continued medication acquisition to prevent break in therapy.  All questions answered.  Kathleen Flores voiced understanding and appreciation.   Patient knows to call the office with questions or concerns.  Kathleen Flores, PharmD, BCPS, BCOP  08/15/2019   11:13 AM Oral Oncology Clinic [phone]

## 2019-08-15 NOTE — Telephone Encounter (Signed)
Oral Oncology Pharmacist Encounter  Discussed capecitabine dosing with MD, dose reduction ok. New Capecitabine prescription for 500mg  tablets, take 3 talets in AM and 2 tabs in PM, quantity #70, refills=0, for ~757 mg/m2 BID for 14 days on, 7 days off, repeated every 21 days, has been e-scribed to the pharmacy.  Johny Drilling, PharmD, BCPS, BCOP  08/15/2019 11:11 AM Oral Oncology Clinic [phone]

## 2019-08-15 NOTE — Telephone Encounter (Signed)
Oral Oncology Pharmacist Encounter  Received new prescription for Xeloda (capecitabine) for the adjuvant treatment of early stage rectal cancer, planned duration 6 months of adjuvant therapy.  Patient received neoadjuvant radiation with capecitabine sensitization 03/19/19 - 04/27/19 (capecitabine taken 03/19/19-04/25/19), tolerated fairly well Anterior resection with placement of diverting loop ileostomy performed 07/12/19 Patient was noted with 1/22 lymph nodes positive for metastatic disease and is under evaluation for adjuvant capecitabine at ~909 mg/m2 (1500 mg) by mouth twice daily for 14 days on, 7 days off, repeated every 21 days x 6 months total (8 planned cycles)  Labs from 08/14/19 assessed, OK for treatment initiation.  SCr=0.7, round to 1.0 per institutional policy for age > 17, est CrCl ~ 45 mL/min Manufacturer recommends dose reduction to 75% of original prescribed dose for CrCl 30-50 mL/min, will discuss with MD- patient tolerated this same dose previously with radiation but has lost ~19 lbs since that time  Current medication list in Epic reviewed, no DDIs with capecitabine identified.  Prescription has been e-scribed to the [facility] for benefits analysis and approval.  Oral Oncology Clinic will continue to follow for insurance authorization, copayment issues, initial counseling and start date.  Johny Drilling, PharmD, BCPS, BCOP  08/15/2019 8:24 AM Oral Oncology Clinic [phone]

## 2019-08-16 MED FILL — CAPECITABINE 500 MG TABS: 500 | 21 days supply | Qty: 70 | Fill #0

## 2019-08-16 NOTE — Telephone Encounter (Signed)
Oral Oncology Patient Advocate Encounter  Confirmed with [facility] that Xeloda was shipped on 9/3 with a $0 copay.   [facility] [place] Phone [phone] Fax [phone] 08/16/2019   1:52 PM

## 2019-08-20 DIAGNOSIS — E782 Mixed hyperlipidemia: Secondary | ICD-10-CM | POA: Diagnosis not present

## 2019-08-20 DIAGNOSIS — Z452 Encounter for adjustment and management of vascular access device: Secondary | ICD-10-CM | POA: Diagnosis not present

## 2019-08-20 DIAGNOSIS — Z483 Aftercare following surgery for neoplasm: Secondary | ICD-10-CM | POA: Diagnosis not present

## 2019-08-20 DIAGNOSIS — C20 Malignant neoplasm of rectum: Secondary | ICD-10-CM | POA: Diagnosis not present

## 2019-08-20 DIAGNOSIS — K219 Gastro-esophageal reflux disease without esophagitis: Secondary | ICD-10-CM | POA: Diagnosis not present

## 2019-08-20 DIAGNOSIS — F411 Generalized anxiety disorder: Secondary | ICD-10-CM | POA: Diagnosis not present

## 2019-08-20 DIAGNOSIS — E559 Vitamin D deficiency, unspecified: Secondary | ICD-10-CM | POA: Diagnosis not present

## 2019-08-20 DIAGNOSIS — Z432 Encounter for attention to ileostomy: Secondary | ICD-10-CM | POA: Diagnosis not present

## 2019-08-20 DIAGNOSIS — E039 Hypothyroidism, unspecified: Secondary | ICD-10-CM | POA: Diagnosis not present

## 2019-08-20 DIAGNOSIS — Z87891 Personal history of nicotine dependence: Secondary | ICD-10-CM | POA: Diagnosis not present

## 2019-08-20 DIAGNOSIS — I10 Essential (primary) hypertension: Secondary | ICD-10-CM | POA: Diagnosis not present

## 2019-09-02 DIAGNOSIS — C20 Malignant neoplasm of rectum: Secondary | ICD-10-CM | POA: Diagnosis not present

## 2019-09-02 DIAGNOSIS — F411 Generalized anxiety disorder: Secondary | ICD-10-CM | POA: Diagnosis not present

## 2019-09-02 DIAGNOSIS — Z452 Encounter for adjustment and management of vascular access device: Secondary | ICD-10-CM | POA: Diagnosis not present

## 2019-09-02 DIAGNOSIS — I10 Essential (primary) hypertension: Secondary | ICD-10-CM | POA: Diagnosis not present

## 2019-09-02 DIAGNOSIS — Z483 Aftercare following surgery for neoplasm: Secondary | ICD-10-CM | POA: Diagnosis not present

## 2019-09-02 DIAGNOSIS — E782 Mixed hyperlipidemia: Secondary | ICD-10-CM | POA: Diagnosis not present

## 2019-09-02 DIAGNOSIS — E039 Hypothyroidism, unspecified: Secondary | ICD-10-CM | POA: Diagnosis not present

## 2019-09-02 DIAGNOSIS — Z432 Encounter for attention to ileostomy: Secondary | ICD-10-CM | POA: Diagnosis not present

## 2019-09-02 DIAGNOSIS — K219 Gastro-esophageal reflux disease without esophagitis: Secondary | ICD-10-CM | POA: Diagnosis not present

## 2019-09-02 DIAGNOSIS — Z87891 Personal history of nicotine dependence: Secondary | ICD-10-CM | POA: Diagnosis not present

## 2019-09-02 DIAGNOSIS — E559 Vitamin D deficiency, unspecified: Secondary | ICD-10-CM | POA: Diagnosis not present

## 2019-09-03 ENCOUNTER — Other Ambulatory Visit: Payer: PPO

## 2019-09-03 ENCOUNTER — Ambulatory Visit: Payer: PPO | Admitting: Nurse Practitioner

## 2019-09-04 ENCOUNTER — Other Ambulatory Visit: Payer: Self-pay | Admitting: Oncology

## 2019-09-04 ENCOUNTER — Encounter: Payer: Self-pay | Admitting: Nurse Practitioner

## 2019-09-04 ENCOUNTER — Inpatient Hospital Stay (HOSPITAL_BASED_OUTPATIENT_CLINIC_OR_DEPARTMENT_OTHER): Payer: PPO | Admitting: Nurse Practitioner

## 2019-09-04 ENCOUNTER — Other Ambulatory Visit: Payer: Self-pay

## 2019-09-04 ENCOUNTER — Inpatient Hospital Stay: Payer: PPO

## 2019-09-04 ENCOUNTER — Other Ambulatory Visit: Payer: Self-pay | Admitting: *Deleted

## 2019-09-04 VITALS — BP 168/80 | HR 85 | Temp 98.7°F | Wt 131.1 lb

## 2019-09-04 DIAGNOSIS — C20 Malignant neoplasm of rectum: Secondary | ICD-10-CM | POA: Diagnosis not present

## 2019-09-04 LAB — CBC WITH DIFFERENTIAL (CANCER CENTER ONLY)
Abs Immature Granulocytes: 0.01 10*3/uL (ref 0.00–0.07)
Basophils Absolute: 0 10*3/uL (ref 0.0–0.1)
Basophils Relative: 0 %
Eosinophils Absolute: 0.1 10*3/uL (ref 0.0–0.5)
Eosinophils Relative: 2 %
HCT: 33.2 % — ABNORMAL LOW (ref 36.0–46.0)
Hemoglobin: 10.6 g/dL — ABNORMAL LOW (ref 12.0–15.0)
Immature Granulocytes: 0 %
Lymphocytes Relative: 17 %
Lymphs Abs: 0.8 10*3/uL (ref 0.7–4.0)
MCH: 27.5 pg (ref 26.0–34.0)
MCHC: 31.9 g/dL (ref 30.0–36.0)
MCV: 86.2 fL (ref 80.0–100.0)
Monocytes Absolute: 0.4 10*3/uL (ref 0.1–1.0)
Monocytes Relative: 8 %
Neutro Abs: 3.2 10*3/uL (ref 1.7–7.7)
Neutrophils Relative %: 73 %
Platelet Count: 335 10*3/uL (ref 150–400)
RBC: 3.85 MIL/uL — ABNORMAL LOW (ref 3.87–5.11)
RDW: 14.2 % (ref 11.5–15.5)
WBC Count: 4.4 10*3/uL (ref 4.0–10.5)
nRBC: 0 % (ref 0.0–0.2)

## 2019-09-04 LAB — CMP (CANCER CENTER ONLY)
ALT: 22 U/L (ref 0–44)
AST: 23 U/L (ref 15–41)
Albumin: 3.7 g/dL (ref 3.5–5.0)
Alkaline Phosphatase: 96 U/L (ref 38–126)
Anion gap: 11 (ref 5–15)
BUN: 11 mg/dL (ref 8–23)
CO2: 29 mmol/L (ref 22–32)
Calcium: 9.6 mg/dL (ref 8.9–10.3)
Chloride: 97 mmol/L — ABNORMAL LOW (ref 98–111)
Creatinine: 0.69 mg/dL (ref 0.44–1.00)
GFR, Est AFR Am: >60 mL/min (ref >60)
GFR, Estimated: >60 mL/min (ref >60)
Glucose, Bld: 115 mg/dL — ABNORMAL HIGH (ref 70–99)
Potassium: 3.6 mmol/L (ref 3.5–5.1)
Sodium: 137 mmol/L (ref 135–145)
Total Bilirubin: 0.4 mg/dL (ref 0.3–1.2)
Total Protein: 7.5 g/dL (ref 6.5–8.1)

## 2019-09-04 LAB — MAGNESIUM: Magnesium: 1.8 mg/dL (ref 1.7–2.4)

## 2019-09-04 MED ORDER — CAPECITABINE 500 MG PO TABS: ORAL_TABLET | ORAL | 0 refills | Status: DC

## 2019-09-04 NOTE — Progress Notes (Signed)
  Kathleen Flores   Diagnosis: Rectal cancer  INTERVAL HISTORY:   Kathleen Flores returns as scheduled.  She completed cycle 1 adjuvant Xeloda beginning 08/20/2019.  She denies nausea/vomiting.  No mouth sores.  No diarrhea.  She has discontinued MiraLAX.  She continues Lomotil 4 times a day.  She is "weaning off" of IV fluids.  No hand or foot pain or redness.  Objective:  Vital signs in last 24 hours:  Blood pressure (!) 168/80, pulse 85, temperature 98.7 F (37.1 C), temperature source Temporal, weight 131 lb 1.6 oz (59.5 kg), SpO2 100 %.    HEENT: No thrush or ulcers. GI: Abdomen soft and nontender.  No hepatomegaly.  Right abdomen ileostomy.  Small amount of liquid stool in the collection bag. Vascular: No leg edema. Skin: Palms and soles with mild erythema. Right upper extremity PICC without erythema.  Lab Results:  Lab Results  Component Value Date   WBC 4.4 09/04/2019   HGB 10.6 (L) 09/04/2019   HCT 33.2 (L) 09/04/2019   MCV 86.2 09/04/2019   PLT 335 09/04/2019   NEUTROABS 3.2 09/04/2019    Imaging:  No results found.  Medications: I have reviewed the patient's current medications.  Assessment/Plan: 1. Rectal cancer ? Nonobstructing mass in the mid rectum on colonoscopy 02/28/2019, 7 cm from the anal verge, biopsy confirmed adenocarcinoma-at least intramucosal ? CTs 03/05/2019-eccentric soft tissue thickening at the low rectum, multiple tiny lung nodules-nonspecific, 1 cm left adrenal nodule, no definite evidence of metastatic disease ? MR pelvis 03/08/2019-tumor at 6.5 cm from the internal anal sphincter, T3b,N1(single 7 mm right perirectal lymph node) ? Radiation/Xeloda 03/19/2019-04/27/2019, Xeloda completed 04/25/2019 ? CT abdomen/pelvis 06/22/2019-dilated ileum with diffuse wall thickening and mucosal enhancement, no rectal tumor seen, 0.9 cm right lower quadrant ileocolic node, no pelvic or inguinal adenopathy ? Low anterior  resection, ileocecectomy, and diverting loop ileostomy 07/12/2019,[id number]-differentiated adenocarcinoma of the rectum, 1/22 lymph nodes positive, one tumor deposit, negative margins ? Cycle 1 Xeloda 08/20/2019 ? Cycle 2 Xeloda 09/10/2019 2. Hypertension 3. Anxiety   Disposition: Kathleen Flores appears stable.  She has completed 1 cycle of adjuvant Xeloda.  Overall she tolerated well.  Plan to proceed with cycle 2 beginning 09/10/2019.    We discussed the anticipated completion date of chemotherapy if there are no delays.  She would begin the fifth and final cycle 11/12/2019 with the final dose of Xeloda on 11/25/2019.  We reviewed the labs from today, adequate to proceed with treatment as above.  She will return for lab and follow-up in 3 weeks.  She will contact the office in the interim with any problems.  We specifically discussed poorly controlled diarrhea, and adequate fluid intake.  Plan reviewed with Dr. Benay Spice.      Ned Card ANP/GNP-BC   09/04/2019  11:48 AM

## 2019-09-05 MED FILL — CAPECITABINE 500 MG TABS: 500 | 21 days supply | Qty: 70 | Fill #0

## 2019-09-06 ENCOUNTER — Telehealth: Payer: Self-pay | Admitting: Nurse Practitioner

## 2019-09-06 NOTE — Telephone Encounter (Signed)
Called and left msg. Mailed printout  °

## 2019-09-11 ENCOUNTER — Telehealth: Payer: Self-pay

## 2019-09-11 NOTE — Telephone Encounter (Signed)
Returned TC to pt in regard to question about wanting to know if Ned Card NP could work out a schedule for her to be finished with chemo by December 6th. This way she will be done 3 weeks before her reversal surgery on the 29th. I let patient know that Lattie Haw said that she could discuss this with Dr Benay Spice when she sees him on Tuesday 10/13.

## 2019-09-13 ENCOUNTER — Ambulatory Visit: Payer: Self-pay | Admitting: Surgery

## 2019-09-13 NOTE — H&P (Signed)
Kathleen Flores Documented: 09/03/2019 11:47 AM Location: [facility] Patient #: [id number] DOB: 06-14-1946 Married / Language: [name] / Race: White Female   History of Present Illness Adin Hector MD; 09/03/2019 12:16 PM) The patient is a 73 year old female who presents with colorectal cancer. Note for "Colorectal cancer": ` ` ` The patient returns s/p robotically assisted ultra-low anterior resection with coloanal stapled anastomosis and diverting loop ileostomy 07/12/2019  Pathology c/w [id number] (1/22 LN) well differentiated rectal adenoCA   The patient returns to clinic by herself after surgery, gradually improving. She continues iron twice a day along with Lomotil 4 times a day. She backed off on the MiraLAX to once a day since it seemed to be loose. She is emptying her ileostomy bag about every 3 hours. She notes is not a high volume, she dislikes to keep it empty. The appliance bag is staying on for up to 3 days. Her husband changes in appliance. Her appetite is much better. She is not lightheaded or dizzy. She is drinking plenty liquids. She continues on the IV fluids through her PICC line. There is concern of worsening diarrhea off on starting Xeloda, so she was increased to 6 L a week. She has not noticed any difference in ileostomy output or concerns of diarrhea on the Xeloda with the first 2 week course. He gets a little looser output after taking the MiraLAX. Definitely thicker at night. She definitely feels much more optimistic and positive. While the ileostomy can be annoying/difficult, it is getting better tolerated.    `    07/12/2019  12:38 PM  PATIENT: Kathleen Flores 73 y.o. 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  PROCEDURE: ROBOTIC ULTRA LOW ANTERIOR RECTOSIGMOID RESECTION WITH COLOANAL ANASTOMOSIS DIVERTING LOOP ILEOSTOMY ILEOCECECTOMY OMENTAL PEDICLE FLAP ASSESSMENT OF TISSUE PERFUSION BY FIREFLY IMMUNOFLUORESENCE RIGID PROCTOSCOPY BILATERAL TAP BLOCK  SURGEON: Adin Hector, MD  ASSISTANT: Nadeen Landau, MD An experienced assistant was required given the standard of surgical care given the complexity of the case. This assistant was needed for exposure, dissection, suctioning, retraction, instrument exchange, etc.    ANESTHESIA: General and Local anesthetic as a field block  Nerve block provided with liposomal bupivacaine (Experel) mixed with 0.25% bupivacaine as a Bilateral TAP block x 47mL each side at the level of the transverse abdominis & preperitoneal spaces along the flank at the anterior axillary line, from subcostal ridge to iliac crest under laparoscopic guidance  EBL: Total I/O In: 2000 [I.V.:2000] Out: 500 [Urine:450; Blood:50] "see anesthesia record"  Delay start of Pharmacological VTE agent (>24hrs) due to surgical blood loss or risk of bleeding: no  DRAINS: 19 Fr Blake drain in the pelvis  SPECIMEN: Source of Specimen: ILEOCECAL REGION and "RECTOSIGMOID COLON DISTAL ANASTOMOTIC RING  DISPOSITION OF SPECIMEN: PATHOLOGY  COUNTS: YES  PLAN OF CARE: Admit to inpatient  PATIENT DISPOSITION: PACU - hemodynamically stable.  INDICATION:   Pleasant woman found to have bulky tumor in mid/lower rectum. Underwent chemoradiation therapy. Developed partial small bowel obstruction due to ileal thickening suspicious for radiation ileitis stricture. I recommended segmental resection:  The anatomy &  physiology  of the digestive tract was discussed. The pathophysiology was discussed. Natural history risks without surgery was discussed. I worked to give an overview of the disease and the frequent need to have multispecialty involvement. I feel the risks of no intervention will lead to serious problems that outweigh the operative risks; therefore, I recommended a partial colectomy to remove the pathology. Laparoscopic & open techniques were discussed.  Risks such as bleeding, infection, abscess, leak, reoperation, possible ostomy, hernia, heart attack, death, and other risks were discussed. I noted a good likelihood this will help address the problem. Goals of post-operative recovery were discussed as well. We will work to minimize complications. Educational materials on the pathology had been given in the office. Questions were answered.   The patient expressed understanding & wished to proceed with surgery.  OR FINDINGS:  Patient had persistent bulky scarring tumor at the mid/low rectal junction. No obvious metastatic disease on visceral parietal peritoneum or liver.  The anastomosis rests 1-2 cm from the anal verge by rigid proctoscopy. It is a side colon to end 31 coloanal stapled anastomosis.  Ileal thickening x15 cm very close to the terminal ileum. Ileocecectomy done  Diverting loop ileostomy proximal to be colonic anastomosis. Proximal end cephalad  PROCEDURE:  Informed consent was confirmed. The patient underwent general anaesthesia without difficulty. The patient was positioned appropriately. VTE prevention in place. The patient's abdomen was clipped, prepped, & draped in a sterile fashion. Surgical timeout confirmed our plan.  The patient was positioned in reverse Trendelenburg. Abdominal entry was gained using optical entry technique in the right upper abdomen. Entry was clean. I induced carbon dioxide insufflation. Camera inspection revealed no injury. Extra ports  were carefully placed under direct laparoscopic visualization. I reflected the greater omentum and the upper abdomen the small bowel in the upper abdomen. The patient was carefully positioned. The Intuitive daVinci robot was carefully docked with camera & instruments carefully placed.  The patient had No evidence of visceral or parietal peritoneal metastatic disease. No evidence of metastasis. The terminal ileum was obviously thickened and there was some proximal small bowel dilatation consistent with her prior CAT scan implying partial small bowel obstruction. 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  I focused on rectosigmoid resection. I scored the base of peritoneum of the medial side of the mesentery of the left colon from the ligament of Treitz to the peritoneal reflection of the mid rectum. I elevated the sigmoid mesentery and entered into the retro-mesenteric plane. We were able to identify the left ureter and gonadal vessels. We kept those posterior within the retroperitoneum and elevated the left colon mesentery off that. I did isolated IMA pedicle but did not ligate it yet. I continued distally and got into the avascular plane posterior to the mesorectum. This allowed me to help mobilize the rectum as well by freeing the mesorectum off the sacrum. I mobilized the peritoneal coverings towards the peritoneal reflection on both the right and left sides of the rectum. I stayed away from the right and left ureters. I kept the lateral vascular pedicles to the rectum intact.  I dissected around inferior mesenteric arterial and venous pedicles. After getting good skeletonization I did a high ligation using the robotic vessel sealer to good result. Mobilized the left colon mesentery off the retroperitoneum. Kidney 1 to come up with it as well but gradually able to help free to keep it in the retroperitoneal position. Because she had rather redundant stretched out sigmoid  colon,  held off on splenic flexure mobilization.  Proceed with total mesorectal excision. Gradually exposed the rectum and freed it off its attachments to the pelvis. Continued on the presacral plane down to the pelvic floor. Repositioned anteriorly. I was able to score around the peritoneal reflection which was rather low. She had obvious radiation change and inflammation. The anterior rectal wall off the rectovaginal septum to find tattooing at the low rectum. Digital rectal examination to confirm the distal scarring in the low rectum. Further skeletonization in anticipation of resecting at the low rectum. He around the posterior was very thin-walled poor quality. I decided transected at the low rectum to preserve a decent rectal cuff. With that I could eviscerate the rectosigmoid out of the pelvis. We will that clamped and elevated. I did a pursestring stretch around the remaining anal rectal stump with a 2-0 V lock running suture.  Then chose an area in the descending colon that usually reach down to the very low pelvis. I transected the mesentery radially, including the inferior mesenteric venous arterial pedicles to be part of the specimen. We asked anesthesia to dilute the indocyanine green (ICG) to 10 mL and inject 3 mL intravenously with IV flush. I switched to the NIR fluorescence (Firefly mode) imaging window on the daVinci platform. I was able to see good light green visualization of blood vessels with good perfusion of tissues, confirming good tissue perfusion of both ends planned for anastomosis. Stapled off green load robotic stapler at the descending/sigmoid junction where it reached well.  I then mobilized the terminal ileum and cecum in a lateral medial fashion to help make sure he could mobilize for extracorporeal extraction and inspection. Created a Pfannenstiel type extraction incision in the right upper quadrant premarked ileostomy site. Placed wound protector.  Eviscerated the specimen to confirm good distal margins and good high ligation. Sent for pathology. Open and is actually distal. I made a colotomy 6 cm proximal to the staple line and placed a 31 EEA stapler anvil into it. Made a pursestring around it to tied that down. Returned out into the pelvis.  History of the ileocecal region. He did have an open fashion a little bit to do that. Terminated was obviously very thickened with a minimally palpable lumen. Because of its persistent thickening per quite some time the patient essentially having to be on a blenderized liquid diet, I felt it was not a good idea to leave that in place. Therefore I did an ileal cecal resection. Stapled off distal ileum to mid ascending colon side to side fashion. Stapled off the common defect with a TX 90. Transected the mesentery with clamps and silk ties. Close mesenteric defect plane over the TX staple line. Barcelona style anastomosis. Specimen sent off. Placed silk stitches on the soft non-radiated ileum proximal out in anticipation of loop ileostomy.  Brought up the greater omentum and transected it off the right colon to the mid transverse colon to have a nice long omental pedicle flap that can easily reach down the pelvis. Dr. Dema Severin scrubbed down and did gentle anal dilatation. He brought the spike of the EEA stapler about the center of the pursestring rectal stump. Attached the anvil to that he tied down. Held the clamp for 90 seconds. Fired. Held for 30 seconds. Released. 2 good anastomotic rings. The distal ring sent for the final distal margin. He conditionally palpate the anastomosis that was very low. Closest posterior midline. He did not feel any defects. I placed antibiotic irrigation.  He insufflated with a rigid proctoscopy and there was no evidence of any air leak. There is no active bleeding. Consistent with an airtight anastomosis. I brought the greater omentum down to help fill up  the pelvis. I placed a drain as noted above around around the very low anastomosis.  The retroperitoneum was evacuated. While I brought up the distal ileum proximal to the prior ileocecal resection anticipation diverting loop ileostomy. Good mobility. Protected the ports removed. I changed gloves. And closed the port sites with 0 Vicryl on the suprapubic stapler port site fascia. Skin closed with 4-0 Monocryl. Sterile dressings applied. I tied down the fascia around the right upper quadrant premarked ileostomy region using interrupted #1 PDS at the corners until it was more snug. I then created a loop ileostomy with the proximal end cephalad at 12:00 to have a smaller distal end is a pseudo-mucous fistula. With 3-0 Vicryl interrupted sutures to get a good 2 cm Brooke ileostomy. Did close the lateral corner of the wound down a little bit to have more of a circular ostomy defect. Appliance placed.  Patient is being extubated go to recovery room. I discussed postop care with the patient in detail the office & in the holding area. Instructions are written. I updated the status of the patient to the patient's spouse. I made recommendations. I answered questions. Understanding & appreciation was expressed.  Adin Hector, M.D., F.A.C.S. Gastrointestinal and Minimally Invasive Surgery [facility] [address], [place], [place] [address] [phone] Main / Paging    Pathology: ` ` Diagnosis 1. Colon, segmental resection for tumor, rectosigmoid, open-ended distal - INVASIVE ADENOCARCINOMA, WELL DIFFERENTIATED, SPANNING 1.4 CM. - TUMOR INVADES THROUGH MUSCULARIS PROPRIA INTO COLORECTAL ADIPOSE. - RESECTION MARGINS ARE NEGATIVE. - METASTATIC CARCINOMA IN ONE OF TWENTY-TWO LYMPH NODES (1/22). - SATELLITE NODULE. - SEE ONCOLOGY TABLE. 2. Colon, resection margin (donut), final distal margin ring - BENIGN SQUAMOUS AND COLONIC MUCOSA. - NO DYSPLASIA  OR MALIGNANCY. 3. Small intestine, resection, ileocecum, distal ileal stricture - BENIGN SMALL BOWEL WITH ISCHEMIC CHANGE AND ULCER. - BENIGN COLONIC MUCOSA. - BENIGN APPENDIX. - SEVEN OF SEVEN LYMPH NODES NEGATIVE FOR CARCINOMA (0/7). - NO DYSPLASIA OR MALIGNANCY. Microscopic Comment 1. COLON AND RECTUM: Resection, Including Transanal Disk Excision of Rectal Neoplasms Procedure: Rectosigmoid resection. Tumor Site: Mid rectum per requistion. Tumor Size: 1.4 cm. Macroscopic Tumor Perforation: Not identified. Histologic Type: Invasive adenocarcinoma. Histologic Grade: G1, Well-differentiated. Tumor Extension: Through muscularis propria into pericolorectal tissue. Margins: Negative. Treatment Effect: Present (TRS 2). Lymphovascular Invasion: Not identified. Perineural Invasion: Not identified. Tumor Deposits: Present (x1). 1 of 3 FINAL for Strycharz, Dustina G [id number]) Microscopic Comment(continued) Regional Lymph Nodes: Number of Lymph Nodes Involved: 1 Number of Lymph Nodes Examined: 22 (regional) Pathologic Stage Classification (pTNM, AJCC 8th Edition): ypT3, ypN1a Ancillary Studies: Can be performed upon request. Representative tumor block: 1E Comments: There is a positive lymph node (1I) with extracapsular extension and the tumor extends to within 2 mm of the radial margin. (v4.0.1.0) Vicente Males MD Pathologist, Electronic Signature (Case signed 07/16/2019) Specimen Ned Kakar and Clinical Information Specimen(s) Obtained: 1. Colon, segmental resection for tumor, rectosigmoid, open-ended distal 2. Colon, resection margin (donut), final distal margin ring 3. Small intestine, resection, ileocecum, distal ileal stricture Specimen Clinical Information 1. mid rectal cancer [rd] Devron Cohick 1. Specimen: Received fresh labeled rectosigmoid. Specimen integrity: An intact portion of colon with one stapled and one opened resection margin. Per the requistion, the open end is  distal. Specimen length: 26.5 cm. Mesorectal intactness: Focally disrupted but appears complete. Tumor location: Within the distal aspect of this specimen along the right posterior aspect. Tumor size: There is a 1.4 x 1.2 cm tan white, firm, centrally depressed scar identified. Percent of bowel circumference involved: Approximately 20%. Tumor distance to margins: Proximal: 22.0 cm. Distal: 2.1 cm. Radial (posterior ascending, posterior descending; lateral and posterior mid-rectum; and entire lower 1/3 rectum): 3.5 cm. Macroscopic extent of tumor invasion: Sectioning through the scar reveals tan white, slightly fibrotic cut surface which focally extends into the underlying adipose tissue. Total presumed lymph nodes: Twenty-four tan pink to gray possible lymph nodes are identified, ranging from 0.1 to 0.4 cm in greatest dimension. Extramural satellite tumor nodules: Within the mesorectum, approximately 6.0 cm proximal to the mucosal lesion there is a 0.7 cm in greatest dimension, tan white, firm, ill defined possible identified. The possible tumor nodule measures 0.1 cm from the radial resection margin (inked black). Mucosal polyp(s): No other mucosal lesions are grossly identified. Additional findings: The uninvolved mucosa is tan pink with normal folding. The mucosa surrounding the scar displays gray-blue discoloration, suggestive of tattoo powder. Block summary: A= proximal margin. 2 of 3 FINAL for Surgeon, Charee G ([id number]) Archer Vise(continued) B= distal margin, perpendicular to mucosal scar. C-G= mucosal scar, entirely submitted. H= uninvolved mucosa. I= possible extramural tumor nodule. J-L= five possible lymph nodes, each. M= four possible lymph nodes. N= three possible lymph nodes. O= two possible lymph nodes. 2. Received fresh and consists of a circular portion of tan gray mucosa, measuring 2.6 x 1.9 cm in diameter and up to 1.4 cm in length. The exposed mucosa is tan gray  and focally hemorrhagic. Representative sections are submitted in one cassette. 3. Received fresh labeled ileocecum with distal ileal stricture, and consists of a looped portion of bowel stapled together. The ileum measures 23.0 cm in length, and the cecum and righ colon measures 7.5 cm in length. The serosal surface of the ileum is tan gray to purple, with attached tan yellow adipose tissue. A length of approximately 7.0 cm is gray purple and slightly hyperemic, with indurated wall. The area of interest measures 9.5 cm from the proximal margin. The lumen is filled with a moderate amount of tan brown partially digested material. The mucosa within the ileum ranges from tan pink with normal folding to tan gray, hyperemic, with mild flattening of the mucosal folds and tan white proximal exudate along the area of stricture. Ileal wall ranges from 0.2 to 0.6 cm in thickness. The mucosa of the colon is tan pink with normal folding, and no mucosal lesion are grossly identified. The appendix is present, measuring 3.2 cm in length x 0.6 cm in diameter. The serosal surface is tan and smooth. The mucosa of the appendix is tan, the wall measures 0.2 cm in thickness , and the lumen is pinpoint. Six tan-pink to gray possible lymph nodes are identified, ranging from 0.2 to 0.3 cm in greatest dimension. Representative sections are submitted in seven cassettes. A= proximal margin. B= distal margin. C,D= representative sections of ileum. E= ileocecal valve and cecum. F= appendix. G= six possible lymph nodes. (KL:gt, 07/13/19) Report signed out from the following location(s) Technical component and interpretation was performed at [facility], [place], [place] [address]. 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 Allergies Reconciled   Medication History Nance Pew, CMA; 09/03/2019 11:48 AM) Lomotil (2.5-0.025MG  Tablet, 2 (two) Oral four times daily, Taken starting 08/13/2019) Active. Xanax (0.5MG  Tablet, 1-2 Oral 30 minutes prior to MRI, Taken starting 03/07/2019) Active. amLODIPine Besylate (5MG  Tablet, Oral) Active. ALPRAZolam (0.25MG  Tablet, Oral) Active. amLODIPine Besylate (Oral) Specific strength unknown - Active. Losartan Potassium (Oral) Specific strength unknown - Active. Synthroid (125MCG Tablet, Oral) Active. Vitamin C (Oral) Specific strength unknown - Active. Vitamin D3 (Oral) Specific strength unknown - Active. Calcium (Oral) Specific strength unknown - Active. Zinc (Oral) Specific strength unknown - Active. Elderberry (Oral) Specific strength unknown - Active. Biotin (Oral) Specific strength unknown - Active. Medications Reconciled  Social History Adin Hector, MD; 09/03/2019 11:50 AM) Alcohol use  Moderate alcohol use. Caffeine use  Carbonated beverages. No drug use  Tobacco use  Former smoker.  Family History Adin Hector, MD; 09/03/2019 11:50 AM) Alcohol Abuse  Father. Cancer  Mother. Heart Disease  Father. Heart disease in female family member before age 54  Hypertension  Mother.  Pregnancy / Birth History Adin Hector, MD; 09/03/2019 11:50 AM) Age at menarche  27  years. Age of menopause  65-55 Gravida  1 Maternal age  50-25 Para  51  Other Problems Adin Hector, MD; 09/03/2019 11:50 AM) High blood pressure  Hypercholesterolemia  Lump In Breast  Oophorectomy  Bilateral. 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 General Appearance-Not in acute distress. Voice-Normal. Note: Alert. Well-groomed. Moving normally. Smiling. This is the best I have seen her.   Integumentary Global Assessment Normal Exam - Distribution of scalp and body hair is normal. General Characteristics Overall Skin Surface - no rashes and no suspicious lesions.  Head and Neck Head-normocephalic, atraumatic with no lesions or palpable masses. Face Global Assessment - atraumatic, no absence of expression. Neck Global Assessment - no abnormal movements, no decreased range of motion. Trachea-midline. Thyroid Gland Characteristics - non-tender.  Eye Eyeball - Left-Extraocular movements intact, No Nystagmus - Left. Eyeball - Right-Extraocular movements intact, No Nystagmus - Right. Upper Eyelid - Left-No Cyanotic - Left. Upper Eyelid - Right-No Cyanotic - Right. Note: Wears glasses. Vision corrected   Chest and Lung Exam Inspection Accessory muscles - No use of accessory muscles in breathing.  Abdomen Note: Ileostomy RUQ in place  Incisions with normal healing ridges. No active bleeding. No cellulitis. No guarding/rebound tenderness   Rectal Note: Deferred for now   Peripheral Vascular Upper Extremity Inspection - Not Gangrenous, No Petechiae. Inspection - Not Gangrenous, No  Petechiae.  Neurologic Neurologic evaluation reveals -normal attention span and ability to concentrate, able to name objects and  repeat phrases. Appropriate fund of knowledge and normal coordination.  Neuropsychiatric Mental status exam performed with findings of-able to articulate well with normal speech/language, rate, volume and coherence and no evidence of hallucinations, delusions, obsessions or homicidal/suicidal ideation. Orientation-oriented X3.  Musculoskeletal Global Assessment Gait and Station - normal gait and station.  Lymphatic General Lymphatics Description - No Generalized lymphadenopathy.    Assessment & Plan Adin Hector MD; 09/03/2019 12:14 PM)  ILEOSTOMY IN PLACE (Z93.2) Impression: Doing better with ileostomy. Husband is doing the changing's.  Current Plans Pt Education - [facility] Free Text Education/Instructions: discussed with patient and provided information. The patient was instructed to call back in 2 weeks with progress The anatomy & physiology of the digestive tract was discussed. The pathophysiology was discussed. Possibility of remaining with an ostomy permanently was discussed. I offered ostomy takedown. Laparoscopic & open techniques were discussed.  Risks such as bleeding, infection, abscess, leak, reoperation, possible re-ostomy, injury to other organs, hernia, heart attack, death, and other risks were discussed. I noted a good likelihood this will help address the problem. Goals of post-operative recovery were discussed as well. We will work to minimize complications. Questions were answered. The patient expresses understanding & wishes to proceed with surgery.  Pt Education - Good bowel health Pt Education - CCS Ostomy HCI (Albertia Carvin): discussed with patient and provided information.  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 Doing relatively well. Seems to be low volumes and thicker overall. Her appetite and thirst are improved, so I think she is keeping up with things better.  Consider stopping the MiraLAX totally since it seems to be making her bowels looser. Continue Lomotil 4 times a day. Continue iron twice a day.  Her oncologist wanted to increase the amount of IV fluids. She is getting 6 L a week (once a day 6 times a week) 6. Because she is feeling better, think is reasonable to back off to 3 times a week and see how things go. She would finish the remaining bags over the next 3 weeks. 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 Side: 2 and anal step anastomosis and diverting loop ileostomy.  Pathology is lymph node positive, so she would benefit from post-adjuvant chemotherapy. She is due to follow up with Dr. Malachy Mood with medical oncology look at options.  Once it is been greater than 3 months from surgery and she is more in 3 weeks off of post-adjuvant chemotherapy if she chooses that; then, plan Loop ileostomy takedown.  Did explain to her that it is common to have old blood/stool/mucous drain out the anus from the diverted colon. That usually backs off after the first few weeks. She notes aside from the larger superior bowel movement, but has not been an issue.  Current Plans Pt Education - CCS Colorectal Cancer (AT): discussed with patient and provided information. Consider follow up colonoscopy by your gastroenterologist, depending on your diagnosis. Call your gastroenterologist for advice  If you had a colon or rectal cancer resected by surgery, you should strongly consider getting a colonoscopy by your gastroenterologict one year after the colon cancer was removed. If it was a benign polyp that was  removed by colon  resection, consider follow-up colonoscopy in about 3 years.   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, [place], [place] [id number] Main / Paging [phone] Fax

## 2019-09-17 DIAGNOSIS — E559 Vitamin D deficiency, unspecified: Secondary | ICD-10-CM | POA: Diagnosis not present

## 2019-09-17 DIAGNOSIS — E039 Hypothyroidism, unspecified: Secondary | ICD-10-CM | POA: Diagnosis not present

## 2019-09-17 DIAGNOSIS — Z432 Encounter for attention to ileostomy: Secondary | ICD-10-CM | POA: Diagnosis not present

## 2019-09-17 DIAGNOSIS — I10 Essential (primary) hypertension: Secondary | ICD-10-CM | POA: Diagnosis not present

## 2019-09-17 DIAGNOSIS — F411 Generalized anxiety disorder: Secondary | ICD-10-CM | POA: Diagnosis not present

## 2019-09-17 DIAGNOSIS — C20 Malignant neoplasm of rectum: Secondary | ICD-10-CM | POA: Diagnosis not present

## 2019-09-17 DIAGNOSIS — Z483 Aftercare following surgery for neoplasm: Secondary | ICD-10-CM | POA: Diagnosis not present

## 2019-09-17 DIAGNOSIS — E782 Mixed hyperlipidemia: Secondary | ICD-10-CM | POA: Diagnosis not present

## 2019-09-17 DIAGNOSIS — Z87891 Personal history of nicotine dependence: Secondary | ICD-10-CM | POA: Diagnosis not present

## 2019-09-17 DIAGNOSIS — Z452 Encounter for adjustment and management of vascular access device: Secondary | ICD-10-CM | POA: Diagnosis not present

## 2019-09-17 DIAGNOSIS — K219 Gastro-esophageal reflux disease without esophagitis: Secondary | ICD-10-CM | POA: Diagnosis not present

## 2019-09-25 ENCOUNTER — Inpatient Hospital Stay: Payer: PPO

## 2019-09-25 ENCOUNTER — Other Ambulatory Visit: Payer: Self-pay

## 2019-09-25 ENCOUNTER — Other Ambulatory Visit: Payer: Self-pay | Admitting: *Deleted

## 2019-09-25 ENCOUNTER — Inpatient Hospital Stay: Payer: PPO | Attending: Nurse Practitioner | Admitting: Oncology

## 2019-09-25 VITALS — BP 139/63 | HR 107 | Temp 98.7°F | Resp 19 | Ht 67.0 in | Wt 129.6 lb

## 2019-09-25 DIAGNOSIS — C20 Malignant neoplasm of rectum: Secondary | ICD-10-CM

## 2019-09-25 DIAGNOSIS — I10 Essential (primary) hypertension: Secondary | ICD-10-CM | POA: Insufficient documentation

## 2019-09-25 DIAGNOSIS — F419 Anxiety disorder, unspecified: Secondary | ICD-10-CM | POA: Insufficient documentation

## 2019-09-25 DIAGNOSIS — E039 Hypothyroidism, unspecified: Secondary | ICD-10-CM

## 2019-09-25 LAB — CBC WITH DIFFERENTIAL (CANCER CENTER ONLY)
Abs Immature Granulocytes: 0.01 10*3/uL (ref 0.00–0.07)
Basophils Absolute: 0 10*3/uL (ref 0.0–0.1)
Basophils Relative: 0 %
Eosinophils Absolute: 0.1 10*3/uL (ref 0.0–0.5)
Eosinophils Relative: 1 %
HCT: 35.6 % — ABNORMAL LOW (ref 36.0–46.0)
Hemoglobin: 11.6 g/dL — ABNORMAL LOW (ref 12.0–15.0)
Immature Granulocytes: 0 %
Lymphocytes Relative: 16 %
Lymphs Abs: 0.8 10*3/uL (ref 0.7–4.0)
MCH: 28.2 pg (ref 26.0–34.0)
MCHC: 32.6 g/dL (ref 30.0–36.0)
MCV: 86.6 fL (ref 80.0–100.0)
Monocytes Absolute: 0.4 10*3/uL (ref 0.1–1.0)
Monocytes Relative: 8 %
Neutro Abs: 3.9 10*3/uL (ref 1.7–7.7)
Neutrophils Relative %: 75 %
Platelet Count: 320 10*3/uL (ref 150–400)
RBC: 4.11 MIL/uL (ref 3.87–5.11)
RDW: 17.3 % — ABNORMAL HIGH (ref 11.5–15.5)
WBC Count: 5.3 10*3/uL (ref 4.0–10.5)
nRBC: 0 % (ref 0.0–0.2)

## 2019-09-25 LAB — CMP (CANCER CENTER ONLY)
ALT: 15 U/L (ref 0–44)
AST: 20 U/L (ref 15–41)
Albumin: 3.9 g/dL (ref 3.5–5.0)
Alkaline Phosphatase: 95 U/L (ref 38–126)
Anion gap: 10 (ref 5–15)
BUN: 18 mg/dL (ref 8–23)
CO2: 29 mmol/L (ref 22–32)
Calcium: 9.9 mg/dL (ref 8.9–10.3)
Chloride: 95 mmol/L — ABNORMAL LOW (ref 98–111)
Creatinine: 0.74 mg/dL (ref 0.44–1.00)
GFR, Est AFR Am: >60 mL/min (ref >60)
GFR, Estimated: >60 mL/min (ref >60)
Glucose, Bld: 104 mg/dL — ABNORMAL HIGH (ref 70–99)
Potassium: 4.7 mmol/L (ref 3.5–5.1)
Sodium: 134 mmol/L — ABNORMAL LOW (ref 135–145)
Total Bilirubin: 0.7 mg/dL (ref 0.3–1.2)
Total Protein: 7.8 g/dL (ref 6.5–8.1)

## 2019-09-25 MED ORDER — CAPECITABINE 500 MG PO TABS: ORAL_TABLET | ORAL | 0 refills | Status: DC

## 2019-09-25 NOTE — Progress Notes (Signed)
MD has changed xeloda to 14 days on and 5 days off to allow her to complete all cycles in time for surgery for reversal of ileostomy around 12/28

## 2019-09-25 NOTE — Progress Notes (Signed)
  [facility] OFFICE PROGRESS NOTE   Diagnosis: Rectal cancer  INTERVAL HISTORY:   Kathleen Flores completed another cycle of Xeloda beginning 09/10/2019.  No mouth sores, diarrhea, or hand/foot pain.  She reports the ileostomy output has decreased since discontinuing the MiraLAX and IV fluids.  She stopped IV fluids approximately 2 weeks ago.  Objective:  Vital signs in last 24 hours:  Blood pressure 139/63, pulse (!) 107, temperature 98.7 F (37.1 C), temperature source Temporal, resp. rate 19, height 5\' 7"  (1.702 m), weight 129 lb 9.6 oz (58.8 kg), SpO2 100 %.    HEENT: No thrush or ulcers, mucous membranes are moist Cardio: Regular rate and rhythm, tachycardia, 2/6 systolic murmur GI: No hepatomegaly, soft, nontender, liquid stool Vascular: No leg edema  Skin: Palms and soles without   Lab Results:  Lab Results  Component Value Date   WBC 5.3 09/25/2019   HGB 11.6 (L) 09/25/2019   HCT 35.6 (L) 09/25/2019   MCV 86.6 09/25/2019   PLT 320 09/25/2019   NEUTROABS 3.9 09/25/2019    CMP  Lab Results  Component Value Date   NA 134 (L) 09/25/2019   K 4.7 09/25/2019   CL 95 (L) 09/25/2019   CO2 29 09/25/2019   GLUCOSE 104 (H) 09/25/2019   BUN 18 09/25/2019   CREATININE 0.74 09/25/2019   CALCIUM 9.9 09/25/2019   PROT 7.8 09/25/2019   ALBUMIN 3.9 09/25/2019   AST 20 09/25/2019   ALT 15 09/25/2019   ALKPHOS 95 09/25/2019   BILITOT 0.7 09/25/2019   GFRNONAA >60 09/25/2019   GFRAA >60 09/25/2019    Medications: I have reviewed the patient's current medications.   Assessment/Plan: 1. Rectal cancer ? Nonobstructing mass in the mid rectum on colonoscopy 02/28/2019, 7 cm from the anal verge, biopsy confirmed adenocarcinoma-at least intramucosal ? CTs 03/05/2019-eccentric soft tissue thickening at the low rectum, multiple tiny lung nodules-nonspecific, 1 cm left adrenal nodule, no definite evidence of metastatic disease ? MR pelvis 03/08/2019-tumor at 6.5 cm  from the internal anal sphincter, T3b,N1(single 7 mm right perirectal lymph node) ? Radiation/Xeloda 03/19/2019-04/27/2019, Xeloda completed 04/25/2019 ? CT abdomen/pelvis 06/22/2019-dilated ileum with diffuse wall thickening and mucosal enhancement, no rectal tumor seen, 0.9 cm right lower quadrant ileocolic node, no pelvic or inguinal adenopathy ? Low anterior resection, ileocecectomy, and diverting loop ileostomy 07/12/2019,[id number]-differentiated adenocarcinoma of the rectum, 1/22 lymph nodes positive, one tumor deposit, negative margins ? Cycle 1 Xeloda 08/20/2019 ? Cycle 2 Xeloda 09/10/2019 ? Cycle 3 Xeloda 09/29/2019 2. Hypertension 3. Anxiety     Disposition: Kathleen Flores appears well.  She is tolerating Xeloda without significant toxicity.  She would like to change to a 5-day treatment break between cycles in order to have the ileostomy reversed this year.  I think this is reasonable since she is tolerating the treatment well.  She will begin the next cycle of Xeloda on 09/29/2019.  Kathleen Flores will return for an office visit on 10/17/2019. She requested we check the TSH today. Betsy Coder, MD  09/25/2019  10:15 AM

## 2019-09-26 ENCOUNTER — Telehealth: Payer: Self-pay | Admitting: Oncology

## 2019-09-26 MED FILL — CAPECITABINE 500 MG TABS: 500 | 19 days supply | Qty: 70 | Fill #0

## 2019-09-26 NOTE — Telephone Encounter (Signed)
Called and spoke with patient. Confirmed appt  °

## 2019-09-28 DIAGNOSIS — Z432 Encounter for attention to ileostomy: Secondary | ICD-10-CM | POA: Diagnosis not present

## 2019-10-05 ENCOUNTER — Telehealth: Payer: Self-pay | Admitting: *Deleted

## 2019-10-05 NOTE — Telephone Encounter (Signed)
Called to report she has had sight shortness of breath w/exertion on intermittent basis, but no chest pain or swelling. Wanted to report this since MD asks her with every visit if she is having shortness of breath. Informed her this is a standard question MD asks to assess performance status. Most likely this is due to her treatment and her body's reaction to it. As long as she does not develop persistent SOB, chest pain or swelling in legs she is OK. Informed her that the TSH could not be added to last visit labs and will be checked on 11/3.

## 2019-10-08 ENCOUNTER — Other Ambulatory Visit: Payer: Self-pay | Admitting: Nurse Practitioner

## 2019-10-08 DIAGNOSIS — Z1231 Encounter for screening mammogram for malignant neoplasm of breast: Secondary | ICD-10-CM

## 2019-10-11 ENCOUNTER — Other Ambulatory Visit: Payer: Self-pay | Admitting: Oncology

## 2019-10-11 DIAGNOSIS — C20 Malignant neoplasm of rectum: Secondary | ICD-10-CM

## 2019-10-15 MED FILL — CAPECITABINE 500 MG TABS: 500 | 19 days supply | Qty: 70 | Fill #0

## 2019-10-16 ENCOUNTER — Inpatient Hospital Stay: Payer: PPO

## 2019-10-16 ENCOUNTER — Inpatient Hospital Stay: Payer: PPO | Attending: Nurse Practitioner | Admitting: Nurse Practitioner

## 2019-10-16 ENCOUNTER — Other Ambulatory Visit: Payer: Self-pay

## 2019-10-16 ENCOUNTER — Encounter: Payer: Self-pay | Admitting: Nurse Practitioner

## 2019-10-16 VITALS — BP 153/62 | HR 94 | Temp 98.7°F | Resp 18 | Ht 67.0 in | Wt 127.0 lb

## 2019-10-16 DIAGNOSIS — C20 Malignant neoplasm of rectum: Secondary | ICD-10-CM

## 2019-10-16 DIAGNOSIS — Z79899 Other long term (current) drug therapy: Secondary | ICD-10-CM | POA: Diagnosis not present

## 2019-10-16 DIAGNOSIS — I10 Essential (primary) hypertension: Secondary | ICD-10-CM | POA: Diagnosis not present

## 2019-10-16 DIAGNOSIS — F419 Anxiety disorder, unspecified: Secondary | ICD-10-CM | POA: Insufficient documentation

## 2019-10-16 DIAGNOSIS — E860 Dehydration: Secondary | ICD-10-CM | POA: Insufficient documentation

## 2019-10-16 DIAGNOSIS — R0609 Other forms of dyspnea: Secondary | ICD-10-CM | POA: Diagnosis not present

## 2019-10-16 DIAGNOSIS — E039 Hypothyroidism, unspecified: Secondary | ICD-10-CM

## 2019-10-16 LAB — CBC WITH DIFFERENTIAL (CANCER CENTER ONLY)
Abs Immature Granulocytes: 0.02 10*3/uL (ref 0.00–0.07)
Basophils Absolute: 0 10*3/uL (ref 0.0–0.1)
Basophils Relative: 0 %
Eosinophils Absolute: 0.1 10*3/uL (ref 0.0–0.5)
Eosinophils Relative: 1 %
HCT: 31.5 % — ABNORMAL LOW (ref 36.0–46.0)
Hemoglobin: 10.9 g/dL — ABNORMAL LOW (ref 12.0–15.0)
Immature Granulocytes: 0 %
Lymphocytes Relative: 10 %
Lymphs Abs: 0.7 10*3/uL (ref 0.7–4.0)
MCH: 29.5 pg (ref 26.0–34.0)
MCHC: 34.6 g/dL (ref 30.0–36.0)
MCV: 85.4 fL (ref 80.0–100.0)
Monocytes Absolute: 0.4 10*3/uL (ref 0.1–1.0)
Monocytes Relative: 7 %
Neutro Abs: 5.4 10*3/uL (ref 1.7–7.7)
Neutrophils Relative %: 82 %
Platelet Count: 320 10*3/uL (ref 150–400)
RBC: 3.69 MIL/uL — ABNORMAL LOW (ref 3.87–5.11)
RDW: 20.1 % — ABNORMAL HIGH (ref 11.5–15.5)
WBC Count: 6.7 10*3/uL (ref 4.0–10.5)
nRBC: 0 % (ref 0.0–0.2)

## 2019-10-16 LAB — CMP (CANCER CENTER ONLY)
ALT: 14 U/L (ref 0–44)
AST: 20 U/L (ref 15–41)
Albumin: 4.1 g/dL (ref 3.5–5.0)
Alkaline Phosphatase: 98 U/L (ref 38–126)
Anion gap: 13 (ref 5–15)
BUN: 37 mg/dL — ABNORMAL HIGH (ref 8–23)
CO2: 29 mmol/L (ref 22–32)
Calcium: 9.8 mg/dL (ref 8.9–10.3)
Chloride: 83 mmol/L — ABNORMAL LOW (ref 98–111)
Creatinine: 1.19 mg/dL — ABNORMAL HIGH (ref 0.44–1.00)
GFR, Est AFR Am: 53 mL/min — ABNORMAL LOW (ref >60)
GFR, Estimated: 46 mL/min — ABNORMAL LOW (ref >60)
Glucose, Bld: 128 mg/dL — ABNORMAL HIGH (ref 70–99)
Potassium: 3.4 mmol/L — ABNORMAL LOW (ref 3.5–5.1)
Sodium: 125 mmol/L — ABNORMAL LOW (ref 135–145)
Total Bilirubin: 0.8 mg/dL (ref 0.3–1.2)
Total Protein: 7.6 g/dL (ref 6.5–8.1)

## 2019-10-16 LAB — MAGNESIUM: Magnesium: 1.7 mg/dL (ref 1.7–2.4)

## 2019-10-16 LAB — TSH: TSH: 2.618 u[IU]/mL (ref 0.308–3.960)

## 2019-10-16 NOTE — Progress Notes (Signed)
  [facility] OFFICE PROGRESS NOTE   Diagnosis: Rectal cancer  INTERVAL HISTORY:   Ms. Salyards returns as scheduled.  She completed cycle 3 adjuvant Xeloda beginning 09/29/2019.  She denies nausea/vomiting.  No mouth sores.  No change in watery output from the ileostomy.  She notes that stool "bulkss up" during the course of the day.  No hand or foot redness.  Last week she had pain over the soles.  This has resolved.  She continues to have intermittent dyspnea on exertion.  No fever, cough, chest pain.  She denies leg swelling and calf pain.  Objective:  Vital signs in last 24 hours:  Blood pressure (!) 153/62, pulse 94, temperature 98.7 F (37.1 C), temperature source Temporal, resp. rate 18, height 5\' 7"  (1.702 m), weight 127 lb (57.6 kg), SpO2 97 %.    HEENT: No thrush or ulcers. Resp: Lungs clear bilaterally. Cardio: Regular rate and rhythm. GI: Abdomen soft and nontender.  No hepatomegaly.  Right abdomen ileostomy.  No stool in the collection bag. Vascular: No leg edema.  Calves soft and nontender. Neuro: Alert and oriented. Skin: Palms and soles with mild erythema.  No skin breakdown.   Lab Results:  Lab Results  Component Value Date   WBC 6.7 10/16/2019   HGB 10.9 (L) 10/16/2019   HCT 31.5 (L) 10/16/2019   MCV 85.4 10/16/2019   PLT 320 10/16/2019   NEUTROABS 5.4 10/16/2019    Imaging:  No results found.  Medications: I have reviewed the patient's current medications.  Assessment/Plan: 1. Rectal cancer ? Nonobstructing mass in the mid rectum on colonoscopy 02/28/2019, 7 cm from the anal verge, biopsy confirmed adenocarcinoma-at least intramucosal ? CTs 03/05/2019-eccentric soft tissue thickening at the low rectum, multiple tiny lung nodules-nonspecific, 1 cm left adrenal nodule, no definite evidence of metastatic disease ? MR pelvis 03/08/2019-tumor at 6.5 cm from the internal anal sphincter, T3b,N1(single 7 mm right perirectal lymph node) ?  Radiation/Xeloda 03/19/2019-04/27/2019, Xeloda completed 04/25/2019 ? CT abdomen/pelvis 06/22/2019-dilated ileum with diffuse wall thickening and mucosal enhancement, no rectal tumor seen, 0.9 cm right lower quadrant ileocolic node, no pelvic or inguinal adenopathy ? Low anterior resection, ileocecectomy, and diverting loop ileostomy 07/12/2019,[id number]-differentiated adenocarcinoma of the rectum, 1/22 lymph nodes positive, one tumor deposit, negative margins ? Cycle 1 Xeloda 08/20/2019 ? Cycle 2 Xeloda 09/10/2019 ? Cycle 3 Xeloda 09/29/2019 ? Cycle 4 Xeloda 10/18/2019 2. Hypertension 3. Anxiety  Disposition: Ms. Lariviere appears stable.  She has completed 3 cycles of adjuvant Xeloda.  Plan to proceed with cycle 4 as scheduled beginning 10/18/2019.  We reviewed the CBC from today.  Counts adequate to proceed with treatment.  We reviewed the chemistry panel results.  She appears dehydrated.  We forwarded a copy of today's labs to Dr. Johney Maine, question resume IV fluids.  She has intermittent dyspnea on exertion, no associated symptoms.  She will contact the office if this worsens or she develops new symptoms.    She will return for lab and follow-up on 11/02/2019.  She will contact the office in the interim with any problems.  Plan reviewed with Dr. Benay Spice.    Ned Card ANP/GNP-BC   10/16/2019  2:13 PM

## 2019-10-17 ENCOUNTER — Telehealth: Payer: Self-pay | Admitting: Oncology

## 2019-10-17 NOTE — Telephone Encounter (Signed)
Scheduled per los. Called and spoke with patient. Confirmed appt 

## 2019-10-22 ENCOUNTER — Telehealth: Payer: Self-pay | Admitting: Nurse Practitioner

## 2019-10-22 ENCOUNTER — Telehealth: Payer: Self-pay | Admitting: *Deleted

## 2019-10-22 NOTE — Telephone Encounter (Signed)
Scheduled appt per 1/19 sch message - pt is aware of appt date and time   

## 2019-10-22 NOTE — Telephone Encounter (Signed)
Per Ned Card, NP, called pt and made aware to stop Xeloda until office visit on 11/10. Pt verbalized understanding

## 2019-10-23 ENCOUNTER — Inpatient Hospital Stay (HOSPITAL_BASED_OUTPATIENT_CLINIC_OR_DEPARTMENT_OTHER): Payer: PPO | Admitting: Nurse Practitioner

## 2019-10-23 ENCOUNTER — Other Ambulatory Visit: Payer: Self-pay

## 2019-10-23 VITALS — BP 149/69 | HR 95 | Temp 98.7°F | Resp 17 | Ht 67.0 in | Wt 125.4 lb

## 2019-10-23 DIAGNOSIS — C20 Malignant neoplasm of rectum: Secondary | ICD-10-CM | POA: Diagnosis not present

## 2019-10-23 DIAGNOSIS — Z432 Encounter for attention to ileostomy: Secondary | ICD-10-CM | POA: Diagnosis not present

## 2019-10-23 NOTE — Progress Notes (Addendum)
  [facility] OFFICE PROGRESS NOTE   Diagnosis: Rectal cancer  INTERVAL HISTORY:   Kathleen Flores returns prior to scheduled follow-up.  She began cycle 4 adjuvant Xeloda beginning 10/18/2019.  Hands became very red and painful on 10/21/2019.  She stopped Xeloda beginning 10/22/2019.  Hands are better today.  She denies nausea/vomiting.  No mouth sores.  No diarrhea.  Objective:  Vital signs in last 24 hours:  Blood pressure (!) 149/69, pulse 95, temperature 98.7 F (37.1 C), temperature source Temporal, resp. rate 17, height 5\' 7"  (1.702 m), weight 125 lb 6.4 oz (56.9 kg), SpO2 100 %.    HEENT: No thrush or ulcers. GI: Abdomen soft and nontender.  No hepatomegaly.  Right abdomen ileostomy. Vascular: No leg edema. Neuro: Alert and oriented. Skin: Palms with erythema, skin thickening.  No skin breakdown.  Soles are dry appearing, mild erythema.   Lab Results:  Lab Results  Component Value Date   WBC 6.7 10/16/2019   HGB 10.9 (L) 10/16/2019   HCT 31.5 (L) 10/16/2019   MCV 85.4 10/16/2019   PLT 320 10/16/2019   NEUTROABS 5.4 10/16/2019    Imaging:  No results found.  Medications: I have reviewed the patient's current medications.  Assessment/Plan: 1. Rectal cancer ? Nonobstructing mass in the mid rectum on colonoscopy 02/28/2019, 7 cm from the anal verge, biopsy confirmed adenocarcinoma-at least intramucosal ? CTs 03/05/2019-eccentric soft tissue thickening at the low rectum, multiple tiny lung nodules-nonspecific, 1 cm left adrenal nodule, no definite evidence of metastatic disease ? MR pelvis 03/08/2019-tumor at 6.5 cm from the internal anal sphincter, T3b,N1(single 7 mm right perirectal lymph node) ? Radiation/Xeloda 03/19/2019-04/27/2019, Xeloda completed 04/25/2019 ? CT abdomen/pelvis 06/22/2019-dilated ileum with diffuse wall thickening and mucosal enhancement, no rectal tumor seen, 0.9 cm right lower quadrant ileocolic node, no pelvic or inguinal adenopathy ?  Low anterior resection, ileocecectomy, and diverting loop ileostomy 07/12/2019,[id number]-differentiated adenocarcinoma of the rectum, 1/22 lymph nodes positive, one tumor deposit, negative margins ? Cycle 1 Xeloda 08/20/2019 ? Cycle 2 Xeloda 09/10/2019 ? Cycle 3 Xeloda 09/29/2019 ? Cycle 4 Xeloda 10/18/2019, placed on hold beginning 10/22/2019 due to hand-foot syndrome, Xeloda resumed 10/23/2019 at a reduced dose of 1000 mg twice daily for the remainder of the cycle 2. Hypertension 3. Anxiety  Disposition: Kathleen Flores has completed 3 cycles of adjuvant Xeloda.  She began cycle 4 on 10/18/2019.  She developed symptoms of worsening hand-foot syndrome characterized by hand redness and pain on 10/21/2019.  Xeloda on hold beginning 10/22/2019.  Symptoms are better today.  She will resume Xeloda at a reduced dose of 1000 mg twice daily for the remainder of the cycle.  She understands to discontinue Xeloda and contact the office if symptoms progress.  She will follow-up as scheduled.  Patient seen with Dr. Benay Spice.    Ned Card ANP/GNP-BC   10/23/2019  10:56 AM This was a shared visit with Ned Card.  Kathleen Flores was interviewed and examined.  She has developed worsened hand-foot syndrome.  The capecitabine he will be dose reduced. We encouraged her to push fluids and to call for symptoms of dehydration. [name], MD

## 2019-10-29 ENCOUNTER — Telehealth: Payer: Self-pay | Admitting: *Deleted

## 2019-10-29 NOTE — Telephone Encounter (Signed)
Pt called with concerns on hands/feet continuing to feel "firey and scalding," due to Xeloda. Per Ned Card, NP, advised pt to stop dosage altogether and f/u with Friday's appt. Pt verbalized understanding.

## 2019-10-30 DIAGNOSIS — Z432 Encounter for attention to ileostomy: Secondary | ICD-10-CM | POA: Diagnosis not present

## 2019-11-02 ENCOUNTER — Inpatient Hospital Stay (HOSPITAL_BASED_OUTPATIENT_CLINIC_OR_DEPARTMENT_OTHER): Payer: PPO | Admitting: Oncology

## 2019-11-02 ENCOUNTER — Inpatient Hospital Stay: Payer: PPO

## 2019-11-02 ENCOUNTER — Other Ambulatory Visit: Payer: Self-pay

## 2019-11-02 VITALS — BP 150/70 | HR 113 | Temp 98.2°F | Resp 17 | Ht 67.0 in | Wt 127.4 lb

## 2019-11-02 DIAGNOSIS — C20 Malignant neoplasm of rectum: Secondary | ICD-10-CM

## 2019-11-02 LAB — CBC WITH DIFFERENTIAL (CANCER CENTER ONLY)
Abs Immature Granulocytes: 0.02 10*3/uL (ref 0.00–0.07)
Basophils Absolute: 0 10*3/uL (ref 0.0–0.1)
Basophils Relative: 0 %
Eosinophils Absolute: 0 10*3/uL (ref 0.0–0.5)
Eosinophils Relative: 1 %
HCT: 30.7 % — ABNORMAL LOW (ref 36.0–46.0)
Hemoglobin: 10.8 g/dL — ABNORMAL LOW (ref 12.0–15.0)
Immature Granulocytes: 0 %
Lymphocytes Relative: 11 %
Lymphs Abs: 0.6 10*3/uL — ABNORMAL LOW (ref 0.7–4.0)
MCH: 31.2 pg (ref 26.0–34.0)
MCHC: 35.2 g/dL (ref 30.0–36.0)
MCV: 88.7 fL (ref 80.0–100.0)
Monocytes Absolute: 0.4 10*3/uL (ref 0.1–1.0)
Monocytes Relative: 8 %
Neutro Abs: 4.4 10*3/uL (ref 1.7–7.7)
Neutrophils Relative %: 80 %
Platelet Count: 343 10*3/uL (ref 150–400)
RBC: 3.46 MIL/uL — ABNORMAL LOW (ref 3.87–5.11)
RDW: 21.3 % — ABNORMAL HIGH (ref 11.5–15.5)
WBC Count: 5.5 10*3/uL (ref 4.0–10.5)
nRBC: 0 % (ref 0.0–0.2)

## 2019-11-02 LAB — CMP (CANCER CENTER ONLY)
ALT: 17 U/L (ref 0–44)
AST: 24 U/L (ref 15–41)
Albumin: 4.3 g/dL (ref 3.5–5.0)
Alkaline Phosphatase: 118 U/L (ref 38–126)
Anion gap: 13 (ref 5–15)
BUN: 17 mg/dL (ref 8–23)
CO2: 27 mmol/L (ref 22–32)
Calcium: 9.3 mg/dL (ref 8.9–10.3)
Chloride: 83 mmol/L — ABNORMAL LOW (ref 98–111)
Creatinine: 0.91 mg/dL (ref 0.44–1.00)
GFR, Est AFR Am: >60 mL/min (ref >60)
GFR, Estimated: >60 mL/min (ref >60)
Glucose, Bld: 113 mg/dL — ABNORMAL HIGH (ref 70–99)
Potassium: 3.6 mmol/L (ref 3.5–5.1)
Sodium: 123 mmol/L — ABNORMAL LOW (ref 135–145)
Total Bilirubin: 0.9 mg/dL (ref 0.3–1.2)
Total Protein: 7.9 g/dL (ref 6.5–8.1)

## 2019-11-02 LAB — MAGNESIUM: Magnesium: 1.6 mg/dL — ABNORMAL LOW (ref 1.7–2.4)

## 2019-11-02 NOTE — Progress Notes (Signed)
[facility] OFFICE PROGRESS NOTE   Diagnosis: Rectal cancer  INTERVAL HISTORY:   Kathleen Flores resume Xeloda on 10/23/2019.  She developed recurrent erythema and a burning sensation at the palms and soles after approximately 2 days.  She discontinued Xeloda 10/29/2019.  The symptoms have improved.  She reports mild dyspnea with activity.  No mouth sores.  No change in stool output.  Objective:  Vital signs in last 24 hours:  Blood pressure (!) 150/70, pulse (!) 113, temperature 98.2 F (36.8 C), temperature source Temporal, resp. rate 17, height 5\' 7"  (1.702 m), weight 127 lb 6.4 oz (57.8 kg), SpO2 100 %.    HEENT: The mucous membranes are moist, no thrush or ulcers Resp: Lungs clear bilaterally Cardio: Regular rate and rhythm, 2/6 systolic murmur GI: Nontender, right abdomen ileostomy with a small amount of liquid stool Vascular: No leg edema  Skin: Erythema and dryness of the palms, mild erythema and dryness of the soles    Lab Results:  Lab Results  Component Value Date   WBC 5.5 11/02/2019   HGB 10.8 (L) 11/02/2019   HCT 30.7 (L) 11/02/2019   MCV 88.7 11/02/2019   PLT 343 11/02/2019   NEUTROABS 4.4 11/02/2019    CMP  Lab Results  Component Value Date   NA 123 (L) 11/02/2019   K 3.6 11/02/2019   CL 83 (L) 11/02/2019   CO2 27 11/02/2019   GLUCOSE 113 (H) 11/02/2019   BUN 17 11/02/2019   CREATININE 0.91 11/02/2019   CALCIUM 9.3 11/02/2019   PROT 7.9 11/02/2019   ALBUMIN 4.3 11/02/2019   AST 24 11/02/2019   ALT 17 11/02/2019   ALKPHOS 118 11/02/2019   BILITOT 0.9 11/02/2019   GFRNONAA >60 11/02/2019   GFRAA >60 11/02/2019    Medications: I have reviewed the patient's current medications.   Assessment/Plan: 1. Rectal cancer ? Nonobstructing mass in the mid rectum on colonoscopy 02/28/2019, 7 cm from the anal verge, biopsy confirmed adenocarcinoma-at least intramucosal ? CTs 03/05/2019-eccentric soft tissue thickening at the low rectum,  multiple tiny lung nodules-nonspecific, 1 cm left adrenal nodule, no definite evidence of metastatic disease ? MR pelvis 03/08/2019-tumor at 6.5 cm from the internal anal sphincter, T3b,N1(single 7 mm right perirectal lymph node) ? Radiation/Xeloda 03/19/2019-04/27/2019, Xeloda completed 04/25/2019 ? CT abdomen/pelvis 06/22/2019-dilated ileum with diffuse wall thickening and mucosal enhancement, no rectal tumor seen, 0.9 cm right lower quadrant ileocolic node, no pelvic or inguinal adenopathy ? Low anterior resection, ileocecectomy, and diverting loop ileostomy 07/12/2019,[id number]-differentiated adenocarcinoma of the rectum, 1/22 lymph nodes positive, one tumor deposit, negative margins ? Cycle 1 Xeloda 08/20/2019 ? Cycle 2 Xeloda 09/10/2019 ? Cycle 3 Xeloda 09/29/2019 ? Cycle 4 Xeloda 10/18/2019, placed on hold beginning 10/22/2019 due to hand-foot syndrome, Xeloda resumed 10/23/2019 at a reduced dose of 1000 mg twice daily for the remainder of the cycle, discontinued 1116 secondary to burning of the hands and feet ? Cycle 5 Xeloda 11/06/19-dose reduced to 500 mg twice daily 2. Hypertension 3. Anxiety    Disposition: Kathleen Flores completed part of cycle 4 adjuvant Xeloda.  Xeloda was discontinued secondary progressive hand/foot symptoms.  She otherwise appears stable.  The plan is to begin a final cycle of adjuvant Xeloda on 11/06/2019.  The Xeloda will again be dose reduced.  She will discontinue Xeloda and contact counts for recurrent hand/foot pain or new symptoms.  Kathleen Flores will return for an office and lab visit on 11/20/2019.  Betsy Coder, MD  11/02/2019  10:50 AM

## 2019-11-05 ENCOUNTER — Telehealth: Payer: Self-pay | Admitting: Oncology

## 2019-11-05 NOTE — Telephone Encounter (Signed)
Scheduled per los. Called and spoke with patient. Confirmed appt 

## 2019-11-20 ENCOUNTER — Encounter: Payer: Self-pay | Admitting: Nurse Practitioner

## 2019-11-20 ENCOUNTER — Inpatient Hospital Stay: Payer: PPO

## 2019-11-20 ENCOUNTER — Other Ambulatory Visit: Payer: Self-pay

## 2019-11-20 ENCOUNTER — Inpatient Hospital Stay: Payer: PPO | Attending: Nurse Practitioner | Admitting: Nurse Practitioner

## 2019-11-20 VITALS — BP 144/60 | HR 95 | Temp 98.0°F | Resp 17 | Ht 67.0 in | Wt 124.7 lb

## 2019-11-20 DIAGNOSIS — I10 Essential (primary) hypertension: Secondary | ICD-10-CM | POA: Insufficient documentation

## 2019-11-20 DIAGNOSIS — C20 Malignant neoplasm of rectum: Secondary | ICD-10-CM | POA: Diagnosis not present

## 2019-11-20 DIAGNOSIS — Z79899 Other long term (current) drug therapy: Secondary | ICD-10-CM | POA: Diagnosis not present

## 2019-11-20 DIAGNOSIS — F419 Anxiety disorder, unspecified: Secondary | ICD-10-CM | POA: Diagnosis not present

## 2019-11-20 LAB — CMP (CANCER CENTER ONLY)
ALT: 16 U/L (ref 0–44)
AST: 22 U/L (ref 15–41)
Albumin: 4.1 g/dL (ref 3.5–5.0)
Alkaline Phosphatase: 121 U/L (ref 38–126)
Anion gap: 10 (ref 5–15)
BUN: 17 mg/dL (ref 8–23)
CO2: 27 mmol/L (ref 22–32)
Calcium: 9.4 mg/dL (ref 8.9–10.3)
Chloride: 82 mmol/L — ABNORMAL LOW (ref 98–111)
Creatinine: 0.82 mg/dL (ref 0.44–1.00)
GFR, Est AFR Am: >60 mL/min (ref >60)
GFR, Estimated: >60 mL/min (ref >60)
Glucose, Bld: 115 mg/dL — ABNORMAL HIGH (ref 70–99)
Potassium: 3.5 mmol/L (ref 3.5–5.1)
Sodium: 119 mmol/L — ABNORMAL LOW (ref 135–145)
Total Bilirubin: 0.6 mg/dL (ref 0.3–1.2)
Total Protein: 7.6 g/dL (ref 6.5–8.1)

## 2019-11-20 LAB — CBC WITH DIFFERENTIAL (CANCER CENTER ONLY)
Abs Immature Granulocytes: 0.01 10*3/uL (ref 0.00–0.07)
Basophils Absolute: 0 10*3/uL (ref 0.0–0.1)
Basophils Relative: 0 %
Eosinophils Absolute: 0.1 10*3/uL (ref 0.0–0.5)
Eosinophils Relative: 1 %
HCT: 30.7 % — ABNORMAL LOW (ref 36.0–46.0)
Hemoglobin: 11 g/dL — ABNORMAL LOW (ref 12.0–15.0)
Immature Granulocytes: 0 %
Lymphocytes Relative: 11 %
Lymphs Abs: 0.5 10*3/uL — ABNORMAL LOW (ref 0.7–4.0)
MCH: 32.4 pg (ref 26.0–34.0)
MCHC: 35.8 g/dL (ref 30.0–36.0)
MCV: 90.3 fL (ref 80.0–100.0)
Monocytes Absolute: 0.4 10*3/uL (ref 0.1–1.0)
Monocytes Relative: 7 %
Neutro Abs: 4 10*3/uL (ref 1.7–7.7)
Neutrophils Relative %: 81 %
Platelet Count: 317 10*3/uL (ref 150–400)
RBC: 3.4 MIL/uL — ABNORMAL LOW (ref 3.87–5.11)
RDW: 18.3 % — ABNORMAL HIGH (ref 11.5–15.5)
WBC Count: 5 10*3/uL (ref 4.0–10.5)
nRBC: 0 % (ref 0.0–0.2)

## 2019-11-20 LAB — MAGNESIUM: Magnesium: 1.7 mg/dL (ref 1.7–2.4)

## 2019-11-20 NOTE — Progress Notes (Signed)
  [facility] OFFICE PROGRESS NOTE   Diagnosis: Rectal cancer  INTERVAL HISTORY:   Ms. Clemens returns as scheduled.  She started cycle 5 adjuvant Xeloda on 11/06/2019.  She discontinued it after about 7 days due to peeling feet.  No associated pain.  The peeling is much better.  She denies nausea/vomiting.  No mouth sores.  No diarrhea.  She reports good fluid intake.  She "fainted" last week after getting out of bed quickly.  She usually sits on the bed for a few minutes before getting up but was trying to get her dog outside.   Objective:  Vital signs in last 24 hours:  Blood pressure (!) 144/60, pulse 95, temperature 98 F (36.7 C), temperature source Temporal, resp. rate 17, height 5\' 7"  (1.702 m), weight 124 lb 11.2 oz (56.6 kg), SpO2 100 %.    HEENT: No thrush or ulcers. GI: Abdomen soft and nontender.  No hepatomegaly.  Right abdomen ileostomy with a small amount of liquid stool in the collection bag. Vascular: No leg edema. Neuro: Alert and oriented. Skin: Palms and soles with hyperpigmentation, skin thickening.  Decreased skin turgor.   Lab Results:  Lab Results  Component Value Date   WBC 5.0 11/20/2019   HGB 11.0 (L) 11/20/2019   HCT 30.7 (L) 11/20/2019   MCV 90.3 11/20/2019   PLT 317 11/20/2019   NEUTROABS 4.0 11/20/2019    Imaging:  No results found.  Medications: I have reviewed the patient's current medications.  Assessment/Plan: 1. Rectal cancer ? Nonobstructing mass in the mid rectum on colonoscopy 02/28/2019, 7 cm from the anal verge, biopsy confirmed adenocarcinoma-at least intramucosal ? CTs 03/05/2019-eccentric soft tissue thickening at the low rectum, multiple tiny lung nodules-nonspecific, 1 cm left adrenal nodule, no definite evidence of metastatic disease ? MR pelvis 03/08/2019-tumor at 6.5 cm from the internal anal sphincter, T3b,N1(single 7 mm right perirectal lymph node) ? Radiation/Xeloda 03/19/2019-04/27/2019, Xeloda completed  04/25/2019 ? CT abdomen/pelvis 06/22/2019-dilated ileum with diffuse wall thickening and mucosal enhancement, no rectal tumor seen, 0.9 cm right lower quadrant ileocolic node, no pelvic or inguinal adenopathy ? Low anterior resection, ileocecectomy, and diverting loop ileostomy 07/12/2019,[id number]-differentiated adenocarcinoma of the rectum, 1/22 lymph nodes positive, one tumor deposit, negative margins ? Cycle 1 Xeloda 08/20/2019 ? Cycle 2 Xeloda 09/10/2019 ? Cycle 3 Xeloda 09/29/2019 ? Cycle 4 Xeloda 10/18/2019, placed on hold beginning 10/22/2019 due to hand-foot syndrome, Xeloda resumed 10/23/2019 at a reduced dose of 1000 mg twice daily for the remainder of the cycle, discontinued 1116 secondary to burning of the hands and feet ? Cycle 5 Xeloda 11/06/19-dose reduced to 500 mg twice daily (discontinued after 7 days due to hand-foot syndrome) 2. Hypertension 3. Anxiety  Disposition: Ms. Carbone appears stable.  She prematurely discontinued the fifth and final cycle of Xeloda due to progressive hand-foot syndrome.  Hands and feet appear improved today.  She is scheduled for reversal of the ileostomy 12/10/2019.  She will return for a CEA and follow-up visit here approximately 01/06/2019.  She will contact the office in the interim with any problems.  She will continue to push fluids orally.  Plan reviewed with Dr. Benay Spice.    Ned Card ANP/GNP-BC   11/20/2019  2:39 PM

## 2019-11-21 ENCOUNTER — Telehealth: Payer: Self-pay | Admitting: *Deleted

## 2019-11-21 ENCOUNTER — Other Ambulatory Visit: Payer: Self-pay

## 2019-11-21 ENCOUNTER — Telehealth: Payer: Self-pay | Admitting: Oncology

## 2019-11-21 ENCOUNTER — Other Ambulatory Visit: Payer: Self-pay | Admitting: *Deleted

## 2019-11-21 ENCOUNTER — Inpatient Hospital Stay: Payer: PPO

## 2019-11-21 DIAGNOSIS — E871 Hypo-osmolality and hyponatremia: Secondary | ICD-10-CM

## 2019-11-21 DIAGNOSIS — C20 Malignant neoplasm of rectum: Secondary | ICD-10-CM | POA: Diagnosis not present

## 2019-11-21 MED ORDER — SODIUM CHLORIDE 0.9 % IV SOLN
INTRAVENOUS | Status: DC
  Administered 2019-11-21: 14:00:00 via INTRAVENOUS
  Filled 2019-11-21 (×2): qty 250

## 2019-11-21 NOTE — Patient Instructions (Addendum)
Dehydration, Adult  Dehydration is when there is not enough fluid or water in your body. This happens when you lose more fluids than you take in. Dehydration can range from mild to very bad. It should be treated right away to keep it from getting very bad. Symptoms of mild dehydration may include:  Thirst.  Dry lips.  Slightly dry mouth.  Dry, warm skin.  Dizziness. Symptoms of moderate dehydration may include:  Very dry mouth.  Muscle cramps.  Dark pee (urine). Pee may be the color of tea.  Your body making less pee.  Your eyes making fewer tears.  Heartbeat that is uneven or faster than normal (palpitations).  Headache.  Light-headedness, especially when you stand up from sitting.  Fainting (syncope). Symptoms of very bad dehydration may include:  Changes in skin, such as: ? Cold and clammy skin. ? Blotchy (mottled) or pale skin. ? Skin that does not quickly return to normal after being lightly pinched and let go (poor skin turgor).  Changes in body fluids, such as: ? Feeling very thirsty. ? Your eyes making fewer tears. ? Not sweating when body temperature is high, such as in hot weather. ? Your body making very little pee.  Changes in vital signs, such as: ? Weak pulse. ? Pulse that is more than 100 beats a minute when you are sitting still. ? Fast breathing. ? Low blood pressure.  Other changes, such as: ? Sunken eyes. ? Cold hands and feet. ? Confusion. ? Lack of energy (lethargy). ? Trouble waking up from sleep. ? Short-term weight loss. ? Unconsciousness. Follow these instructions at home:   If told by your doctor, drink an ORS: ? Make an ORS by using instructions on the package. ? Start by drinking small amounts, about  cup (120 mL) every 5-10 minutes. ? Slowly drink more until you have had the amount that your doctor said to have.  Drink enough clear fluid to keep your pee clear or pale yellow. If you were told to drink an ORS, finish the  ORS first, then start slowly drinking clear fluids. Drink fluids such as: ? Water. Do not drink only water by itself. Doing that can make the salt (sodium) level in your body get too low (hyponatremia). ? Ice chips. ? Fruit juice that you have added water to (diluted). ? Low-calorie sports drinks.  Avoid: ? Alcohol. ? Drinks that have a lot of sugar. These include high-calorie sports drinks, fruit juice that does not have water added, and soda. ? Caffeine. ? Foods that are greasy or have a lot of fat or sugar.  Take over-the-counter and prescription medicines only as told by your doctor.  Do not take salt tablets. Doing that can make the salt level in your body get too high (hypernatremia).  Eat foods that have minerals (electrolytes). Examples include bananas, oranges, potatoes, tomatoes, and spinach.  Keep all follow-up visits as told by your doctor. This is important. Contact a doctor if:  You have belly (abdominal) pain that: ? Gets worse. ? Stays in one area (localizes).  You have a rash.  You have a stiff neck.  You get angry or annoyed more easily than normal (irritability).  You are more sleepy than normal.  You have a harder time waking up than normal.  You feel: ? Weak. ? Dizzy. ? Very thirsty.  You have peed (urinated) only a small amount of very dark pee during 6-8 hours. Get help right away if:  You have   symptoms of very bad dehydration.  You cannot drink fluids without throwing up (vomiting).  Your symptoms get worse with treatment.  You have a fever.  You have a very bad headache.  You are throwing up or having watery poop (diarrhea) and it: ? Gets worse. ? Does not go away.  You have blood or something green (bile) in your throw-up.  You have blood in your poop (stool). This may cause poop to look black and tarry.  You have not peed in 6-8 hours.  You pass out (faint).  Your heart rate when you are sitting still is more than 100 beats a  minute.  You have trouble breathing. This information is not intended to replace advice given to you by your health care provider. Make sure you discuss any questions you have with your health care provider. Document Released: 09/25/2009 Document Revised: 11/11/2017 Document Reviewed: 01/23/2016   Hyponatremia Hyponatremia is when the amount of salt (sodium) in your blood is too low. When salt levels are low, your body may take in extra water. This can cause swelling throughout the body. The swelling often affects the brain. What are the causes? This condition may be caused by:  Certain medical problems or conditions.  Vomiting a lot.  Having watery poop (diarrhea) often.  Certain medicines or illegal drugs.  Not having enough water in the body (dehydration).  Drinking too much water.  Eating a diet that is low in salt.  Large burns on your body.  Too much sweating. What increases the risk? You are more likely to get this condition if you:  Have long-term (chronic) kidney disease.  Have heart failure.  Have a medical condition that causes you to have watery poop often.  Do very hard exercises.  Take medicines that affect the amount of salt is in your blood. What are the signs or symptoms? Symptoms of this condition include:  Headache.  Feeling like you may vomit (nausea).  Vomiting.  Being very tired (lethargic).  Muscle weakness and cramps.  Not wanting to eat as much as normal (loss of appetite).  Feeling weak or light-headed. Severe symptoms of this condition include:  Confusion.  Feeling restless (agitation).  Having a fast heart rate.  Passing out (fainting).  Seizures.  Coma. How is this treated? Treatment for this condition depends on the cause. Treatment may include:  Getting fluids through an IV tube that is put into one of your veins.  Taking medicines to fix the salt levels in your blood. If medicines are causing the problem, your  medicines will need to be changed.  Limiting how much water or fluid you take in.  Monitoring in the hospital to watch your symptoms. Follow these instructions at home:   Take over-the-counter and prescription medicines only as told by your doctor. Many medicines can make this condition worse. Talk with your doctor about any medicines that you are taking.  Eat and drink exactly as you are told by your doctor. ? Eat only the foods you are told to eat. ? Limit how much fluid you take.  Do not drink alcohol.  Keep all follow-up visits as told by your doctor. This is important. Contact a doctor if:  You feel more like you may vomit.  You feel more tired.  Your headache gets worse.  You feel more confused.  You feel weaker.  Your symptoms go away and then they come back.  You have trouble following the diet instructions. Get help right  away if:  You have a seizure.  You pass out.  You keep having watery poop.  You keep vomiting. Summary  Hyponatremia is when the amount of salt in your blood is too low.  When salt levels are low, you can have swelling throughout the body. The swelling mostly affects the brain.  Treatment depends on the cause. Treatment may include getting IV fluids, medicines, or not drinking as much fluid. This information is not intended to replace advice given to you by your health care provider. Make sure you discuss any questions you have with your health care provider. Document Released: 08/11/2011 Document Revised: 02/15/2019 Document Reviewed: 11/02/2018 Elsevier Patient Education  2020 [place] Patient Education  2020 [place] (COVID-19) Are you at risk?  Are you at risk for the Coronavirus (COVID-19)?  To be considered HIGH RISK for Coronavirus (COVID-19), you have to meet the following criteria:  . Traveled to [place], [place], [place], [place] or [place]; or in the [place] to [place], [place], [place], or [place]; and have fever, cough, and shortness of breath within the last 2 weeks of travel OR . Been in close contact with a person diagnosed with COVID-19 within the last 2 weeks and have fever, cough, and shortness of breath . IF YOU DO NOT MEET THESE CRITERIA, YOU ARE CONSIDERED LOW RISK FOR COVID-19.  What to do if you are HIGH RISK for COVID-19?  Marland Kitchen If you are having a medical emergency, call 911. . Seek medical care right away. Before you go to a doctor's office, urgent care or emergency department, call ahead and tell them about your recent travel, contact with someone diagnosed with COVID-19, and your symptoms. You should receive instructions from your physician's office regarding next steps of care.  . When you arrive at healthcare provider, tell the healthcare staff immediately you have returned from visiting [place], [place], [place], [place] or [place]; or traveled in the [place] to [place], [place], [place], or [place]; in the last two weeks or you have been in close contact with a person diagnosed with COVID-19 in the last 2 weeks.   . Tell the health care staff about your symptoms: fever, cough and shortness of breath. . After you have been seen by a medical provider, you will be either: o Tested for (COVID-19) and discharged home on quarantine except to seek medical care if symptoms worsen, and asked to  - Stay home and avoid contact with others until you get your results (4-5 days)  - Avoid travel on public transportation if possible (such as bus, train, or airplane) or o Sent to the Emergency Department by EMS for evaluation, COVID-19 testing, and possible admission depending on your condition and test results.  What to do if you are LOW RISK for COVID-19?  Reduce your risk of any infection by using the same precautions used for avoiding the common cold or flu:  Marland Kitchen Wash your hands often with soap and warm water for at least 20 seconds.  If  soap and water are not readily available, use an alcohol-based hand sanitizer with at least 60% alcohol.  . If coughing or sneezing, cover your mouth and nose by coughing or sneezing into the elbow areas of your shirt or coat, into a tissue or into your sleeve (not your hands). . Avoid shaking hands with others and consider head nods or verbal greetings only. . Avoid touching your eyes,  nose, or mouth with unwashed hands.  . Avoid close contact with people who are sick. . Avoid places or events with large numbers of people in one location, like concerts or sporting events. . Carefully consider travel plans you have or are making. . If you are planning any travel outside or inside the [place], visit the CDC's Travelers' Health webpage for the latest health notices. . If you have some symptoms but not all symptoms, continue to monitor at home and seek medical attention if your symptoms worsen. . If you are having a medical emergency, call 911.   [place] / e-Visit: [email or web address]         [facility]: [facility]: [id number]                   [facility]: [phone]

## 2019-11-21 NOTE — Telephone Encounter (Signed)
Scheduled per los. Called and spoke with patient. Confirmed appt 

## 2019-11-21 NOTE — Telephone Encounter (Signed)
Informed patient of her low Na+ result of 119--this can cause the weakness she feels, as well as dehydration and can increase her risk for seizures. Takes #8 Lomotil/day, but no imodium. Dr. Benay Spice suggests pushing po fluids and coming in today for 1 liter of NS. She agrees to 1330 appointment today. Dr. Benay Spice notified Dr. Johney [place] of lab results today. Planning on ileostomy reversal on 11/24/19.

## 2019-11-29 ENCOUNTER — Encounter (HOSPITAL_COMMUNITY): Payer: Self-pay

## 2019-11-29 NOTE — Patient Instructions (Addendum)
DUE TO COVID-19 ONLY ONE VISITOR IS ALLOWED TO COME WITH YOU AND STAY IN THE WAITING ROOM ONLY DURING PRE OP AND PROCEDURE. THE ONE VISITOR MAY VISIT WITH YOU IN YOUR PRIVATE ROOM DURING VISITING HOURS ONLY!!   COVID SWAB TESTING MUST BE COMPLETED ON:  Thursday, Dec. 24, 2020 at 11:00 AM [address], [place] -[facility] enter pre surgical testing line (Must self quarantine after testing. Follow instructions on handout.)             Your procedure is scheduled on: Monday, Dec. 28, 2020   Report to [facility] Main  Entrance    Report to admitting at 11:00  AM   Call this number if you have problems the morning of surgery [phone]   Do not eat food:After Midnight.   Drink 2 Ensure drinks the night before surgery.   May have liquids until 10:00 AM day of surgery   CLEAR LIQUID DIET  Foods Allowed                                                                     Foods Excluded  Water, Black Coffee and tea, regular and decaf                             liquids that you cannot  Plain Jell-O in any flavor  (No red)                                           see through such as: Fruit ices (not with fruit pulp)                                     milk, soups, orange juice  Iced Popsicles (No red)                                    All solid food Carbonated beverages, regular and diet                                    Apple juices Sports drinks like Gatorade (No red) Lightly seasoned clear broth or consume(fat free) Sugar, honey syrup  Sample Menu Breakfast                                Lunch                                     Supper Cranberry juice                    Beef broth  Chicken broth Jell-O                                     Grape juice                           Apple juice Coffee or tea                        Jell-O                                      Popsicle     Coffee or tea                        Coffee or tea   Complete one Ensure drink the morning of surgery at 10:00 AM the morning of surgery   Brush your teeth the morning of surgery.   Do NOT smoke after Midnight   Take these medicines the morning of surgery with A SIP OF WATER: Amlodipine, Levothyroxine, Imodium if needed                               You may not have any metal on your body including hair pins, jewelry, and body piercings             Do not wear make-up, lotions, powders, perfumes/cologne, or deodorant             Do not wear nail polish.  Do not shave  48 hours prior to surgery.                Do not bring valuables to the hospital. [place].   Contacts, dentures or bridgework may not be worn into surgery.   Bring small overnight bag day of surgery.    Special Instructions: Bring a copy of your healthcare power of attorney and living will documents         the day of surgery if you haven't scanned them in before.              Please read over the following fact sheets you were given:  [facility] - Preparing for Surgery Before surgery, you can play an important role.  Because skin is not sterile, your skin needs to be as free of germs as possible.  You can reduce the number of germs on your skin by washing with CHG (chlorahexidine gluconate) soap before surgery.  CHG is an antiseptic cleaner which kills germs and bonds with the skin to continue killing germs even after washing. Please DO NOT use if you have an allergy to CHG or antibacterial soaps.  If your skin becomes reddened/irritated stop using the CHG and inform your nurse when you arrive at Short Stay. Do not shave (including legs and underarms) for at least 48 hours prior to the first CHG shower.  You may shave your face/neck.  Please follow these instructions carefully:  1.  Shower with CHG Soap the night before surgery and the  morning of surgery.  2.  If  you choose to wash  your hair, wash your hair first as usual with your normal  shampoo.  3.  After you shampoo, rinse your hair and body thoroughly to remove the shampoo.                             4.  Use CHG as you would any other liquid soap.  You can apply chg directly to the skin and wash.  Gently with a scrungie or clean washcloth.  5.  Apply the CHG Soap to your body ONLY FROM THE NECK DOWN.   Do   not use on face/ open                           Wound or open sores. Avoid contact with eyes, ears mouth and   genitals (private parts).                       Wash face,  Genitals (private parts) with your normal soap.             6.  Wash thoroughly, paying special attention to the area where your    surgery  will be performed.  7.  Thoroughly rinse your body with warm water from the neck down.  8.  DO NOT shower/wash with your normal soap after using and rinsing off the CHG Soap.                9.  Pat yourself dry with a clean towel.            10.  Wear clean pajamas.            11.  Place clean sheets on your bed the night of your first shower and do not  sleep with pets. Day of Surgery : Do not apply any lotions/deodorants the morning of surgery.  Please wear clean clothes to the hospital/surgery center.  FAILURE TO FOLLOW THESE INSTRUCTIONS MAY RESULT IN THE CANCELLATION OF YOUR SURGERY  PATIENT SIGNATURE_________________________________  NURSE SIGNATURE__________________________________  ________________________________________________________________________   Kathleen Flores  An incentive spirometer is a tool that can help keep your lungs clear and active. This tool measures how well you are filling your lungs with each breath. Taking long deep breaths may help reverse or decrease the chance of developing breathing (pulmonary) problems (especially infection) following:  A long period of time when you are unable to move or be active. BEFORE THE PROCEDURE   If the  spirometer includes an indicator to show your best effort, your nurse or respiratory therapist will set it to a desired goal.  If possible, sit up straight or lean slightly forward. Try not to slouch.  Hold the incentive spirometer in an upright position. INSTRUCTIONS FOR USE  1. Sit on the edge of your bed if possible, or sit up as far as you can in bed or on a chair. 2. Hold the incentive spirometer in an upright position. 3. Breathe out normally. 4. Place the mouthpiece in your mouth and seal your lips tightly around it. 5. Breathe in slowly and as deeply as possible, raising the piston or the ball toward the top of the column. 6. Hold your breath for 3-5 seconds or for as long as possible. Allow the piston or ball to fall to the bottom of the column. 7. Remove the mouthpiece from your mouth and breathe out normally. 8. Rest  for a few seconds and repeat Steps 1 through 7 at least 10 times every 1-2 hours when you are awake. Take your time and take a few normal breaths between deep breaths. 9. The spirometer may include an indicator to show your best effort. Use the indicator as a goal to work toward during each repetition. 10. After each set of 10 deep breaths, practice coughing to be sure your lungs are clear. If you have an incision (the cut made at the time of surgery), support your incision when coughing by placing a pillow or rolled up towels firmly against it. Once you are able to get out of bed, walk around indoors and cough well. You may stop using the incentive spirometer when instructed by your caregiver.  RISKS AND COMPLICATIONS  Take your time so you do not get dizzy or light-headed.  If you are in pain, you may need to take or ask for pain medication before doing incentive spirometry. It is harder to take a deep breath if you are having pain. AFTER USE  Rest and breathe slowly and easily.  It can be helpful to keep track of a log of your progress. Your caregiver can provide  you with a simple table to help with this. If you are using the spirometer at home, follow these instructions: [place] IF:   You are having difficultly using the spirometer.  You have trouble using the spirometer as often as instructed.  Your pain medication is not giving enough relief while using the spirometer.  You develop fever of 100.5 F (38.1 C) or higher. SEEK IMMEDIATE MEDICAL CARE IF:   You cough up bloody sputum that had not been present before.  You develop fever of 102 F (38.9 C) or greater.  You develop worsening pain at or near the incision site. MAKE SURE YOU:   Understand these instructions.  Will watch your condition.  Will get help right away if you are not doing well or get worse. Document Released: 04/11/2007 Document Revised: 02/21/2012 Document Reviewed: 06/12/2007 [facility] Patient Information 2014 [place], [place].   ________________________________________________________________________

## 2019-11-30 ENCOUNTER — Encounter (HOSPITAL_COMMUNITY)
Admission: RE | Admit: 2019-11-30 | Discharge: 2019-11-30 | Disposition: A | Discharge status: Home / Self Care | Payer: PPO | Source: Ambulatory Visit | Attending: Surgery | Admitting: Surgery

## 2019-11-30 ENCOUNTER — Encounter (HOSPITAL_COMMUNITY): Payer: Self-pay

## 2019-11-30 ENCOUNTER — Other Ambulatory Visit: Payer: Self-pay

## 2019-11-30 DIAGNOSIS — Z01812 Encounter for preprocedural laboratory examination: Secondary | ICD-10-CM | POA: Diagnosis not present

## 2019-11-30 HISTORY — DX: Malignant neoplasm of rectum: C20

## 2019-11-30 HISTORY — DX: Anemia, unspecified: D64.9

## 2019-11-30 HISTORY — DX: Hypo-osmolality and hyponatremia: E87.1

## 2019-11-30 LAB — BASIC METABOLIC PANEL
Anion gap: 12 (ref 5–15)
BUN: 20 mg/dL (ref 8–23)
CO2: 26 mmol/L (ref 22–32)
Calcium: 9.6 mg/dL (ref 8.9–10.3)
Chloride: 84 mmol/L — ABNORMAL LOW (ref 98–111)
Creatinine, Ser: 0.63 mg/dL (ref 0.44–1.00)
GFR calc Af Amer: >60 mL/min (ref >60)
GFR calc non Af Amer: >60 mL/min (ref >60)
Glucose, Bld: 113 mg/dL — ABNORMAL HIGH (ref 70–99)
Potassium: 3.3 mmol/L — ABNORMAL LOW (ref 3.5–5.1)
Sodium: 122 mmol/L — ABNORMAL LOW (ref 135–145)

## 2019-11-30 LAB — CBC
HCT: 32 % — ABNORMAL LOW (ref 36.0–46.0)
Hemoglobin: 11.3 g/dL — ABNORMAL LOW (ref 12.0–15.0)
MCH: 32.9 pg (ref 26.0–34.0)
MCHC: 35.3 g/dL (ref 30.0–36.0)
MCV: 93.3 fL (ref 80.0–100.0)
Platelets: 418 10*3/uL — ABNORMAL HIGH (ref 150–400)
RBC: 3.43 MIL/uL — ABNORMAL LOW (ref 3.87–5.11)
RDW: 16.1 % — ABNORMAL HIGH (ref 11.5–15.5)
WBC: 5.6 10*3/uL (ref 4.0–10.5)
nRBC: 0 % (ref 0.0–0.2)

## 2019-11-30 NOTE — Progress Notes (Addendum)
PCP - T. [facility] FNP Cardiologist - N/A  Chest x-ray - N/A EKG - 07/12/2019 in epic Stress Test - N/A ECHO - N/A Cardiac Cath - N/A  Sleep Study - N/A CPAP - N/A  Fasting Blood Sugar -N/A  Checks Blood Sugar __N/A___ times a day  Blood Thinner Instructions:  N/A Aspirin Instructions: N/A Last Dose: N/A  Anesthesia review: Low potassium and sodium, elevated platelets  Patient denies shortness of breath, fever, cough and chest pain at PAT appointment   Patient verbalized understanding of instructions that were given to them at the PAT appointment. Patient was also instructed that they will need to review over the PAT instructions again at home before surgery.

## 2019-12-01 DIAGNOSIS — Z432 Encounter for attention to ileostomy: Secondary | ICD-10-CM | POA: Diagnosis not present
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
## 2019-12-06 ENCOUNTER — Other Ambulatory Visit (HOSPITAL_COMMUNITY)
Admission: RE | Admit: 2019-12-06 | Discharge: 2019-12-06 | Disposition: A | Discharge status: Home / Self Care | Payer: PPO | Source: Ambulatory Visit | Attending: Surgery | Admitting: Surgery

## 2019-12-06 DIAGNOSIS — Z20828 Contact with and (suspected) exposure to other viral communicable diseases: Secondary | ICD-10-CM | POA: Insufficient documentation

## 2019-12-06 DIAGNOSIS — Z01812 Encounter for preprocedural laboratory examination: Secondary | ICD-10-CM | POA: Insufficient documentation

## 2019-12-07 LAB — NOVEL CORONAVIRUS, NAA (HOSP ORDER, SEND-OUT TO REF LAB; TAT 18-24 HRS): SARS-CoV-2, NAA: NOT DETECTED

## 2019-12-09 MED ORDER — BUPIVACAINE LIPOSOME 1.3 % IJ SUSP
20.0000 mL | INTRAMUSCULAR | Status: DC
  Filled 2019-12-09: qty 20

## 2019-12-09 MED ORDER — SODIUM CHLORIDE 0.9 % IV SOLN
INTRAVENOUS | Status: DC
  Filled 2019-12-09: qty 6

## 2019-12-10 ENCOUNTER — Encounter (HOSPITAL_COMMUNITY)
Admission: RE | Disposition: A | Discharge status: Home / Self Care | Payer: Self-pay | Source: Home / Self Care | Attending: Surgery

## 2019-12-10 ENCOUNTER — Inpatient Hospital Stay (HOSPITAL_COMMUNITY): Payer: PPO | Admitting: Physician Assistant

## 2019-12-10 ENCOUNTER — Encounter (HOSPITAL_COMMUNITY): Payer: Self-pay | Admitting: Surgery

## 2019-12-10 ENCOUNTER — Inpatient Hospital Stay (HOSPITAL_COMMUNITY)
Admission: RE | Admit: 2019-12-10 | Discharge: 2019-12-12 | DRG: 330 | Disposition: A | Discharge status: Home / Self Care | Payer: PPO | Attending: Surgery | Admitting: Surgery

## 2019-12-10 ENCOUNTER — Other Ambulatory Visit: Payer: Self-pay

## 2019-12-10 DIAGNOSIS — E782 Mixed hyperlipidemia: Secondary | ICD-10-CM | POA: Diagnosis present

## 2019-12-10 DIAGNOSIS — C779 Secondary and unspecified malignant neoplasm of lymph node, unspecified: Secondary | ICD-10-CM | POA: Diagnosis not present

## 2019-12-10 DIAGNOSIS — Z9221 Personal history of antineoplastic chemotherapy: Secondary | ICD-10-CM

## 2019-12-10 DIAGNOSIS — K219 Gastro-esophageal reflux disease without esophagitis: Secondary | ICD-10-CM | POA: Diagnosis not present

## 2019-12-10 DIAGNOSIS — Z79899 Other long term (current) drug therapy: Secondary | ICD-10-CM

## 2019-12-10 DIAGNOSIS — Z20828 Contact with and (suspected) exposure to other viral communicable diseases: Secondary | ICD-10-CM | POA: Diagnosis not present

## 2019-12-10 DIAGNOSIS — E039 Hypothyroidism, unspecified: Secondary | ICD-10-CM | POA: Diagnosis present

## 2019-12-10 DIAGNOSIS — Z432 Encounter for attention to ileostomy: Principal | ICD-10-CM

## 2019-12-10 DIAGNOSIS — Z87891 Personal history of nicotine dependence: Secondary | ICD-10-CM

## 2019-12-10 DIAGNOSIS — Z9889 Other specified postprocedural states: Secondary | ICD-10-CM

## 2019-12-10 DIAGNOSIS — I10 Essential (primary) hypertension: Secondary | ICD-10-CM | POA: Diagnosis present

## 2019-12-10 DIAGNOSIS — Z85048 Personal history of other malignant neoplasm of rectum, rectosigmoid junction, and anus: Secondary | ICD-10-CM

## 2019-12-10 DIAGNOSIS — C20 Malignant neoplasm of rectum: Secondary | ICD-10-CM | POA: Diagnosis present

## 2019-12-10 DIAGNOSIS — F411 Generalized anxiety disorder: Secondary | ICD-10-CM | POA: Diagnosis not present

## 2019-12-10 DIAGNOSIS — Z923 Personal history of irradiation: Secondary | ICD-10-CM

## 2019-12-10 DIAGNOSIS — R159 Full incontinence of feces: Secondary | ICD-10-CM | POA: Diagnosis not present

## 2019-12-10 DIAGNOSIS — Z79890 Hormone replacement therapy (postmenopausal): Secondary | ICD-10-CM

## 2019-12-10 DIAGNOSIS — Z932 Ileostomy status: Secondary | ICD-10-CM | POA: Diagnosis not present

## 2019-12-10 HISTORY — PX: ILEOSTOMY CLOSURE: SHX1784

## 2019-12-10 LAB — BASIC METABOLIC PANEL
Anion gap: 13 (ref 5–15)
BUN: 18 mg/dL (ref 8–23)
CO2: 24 mmol/L (ref 22–32)
Calcium: 9.3 mg/dL (ref 8.9–10.3)
Chloride: 87 mmol/L — ABNORMAL LOW (ref 98–111)
Creatinine, Ser: 0.78 mg/dL (ref 0.44–1.00)
GFR calc Af Amer: >60 mL/min (ref >60)
GFR calc non Af Amer: >60 mL/min (ref >60)
Glucose, Bld: 107 mg/dL — ABNORMAL HIGH (ref 70–99)
Potassium: 4.1 mmol/L (ref 3.5–5.1)
Sodium: 124 mmol/L — ABNORMAL LOW (ref 135–145)

## 2019-12-10 LAB — TYPE AND SCREEN
ABO/RH(D): O POS
Antibody Screen: NEGATIVE

## 2019-12-10 SURGERY — CLOSURE, ILEOSTOMY
Anesthesia: General

## 2019-12-10 MED ORDER — ROCURONIUM BROMIDE 10 MG/ML (PF) SYRINGE
PREFILLED_SYRINGE | INTRAVENOUS | Status: AC
  Filled 2019-12-10: qty 10

## 2019-12-10 MED ORDER — BUPIVACAINE-EPINEPHRINE 0.25% -1:200000 IJ SOLN
INTRAMUSCULAR | Status: DC | PRN
  Administered 2019-12-10: 50 mL

## 2019-12-10 MED ORDER — LACTATED RINGERS IV SOLN: INTRAVENOUS | Status: DC

## 2019-12-10 MED ORDER — GABAPENTIN 300 MG PO CAPS
300.0000 mg | ORAL_CAPSULE | ORAL | Status: AC
  Administered 2019-12-10: 12:00:00 300 mg via ORAL
  Filled 2019-12-10: qty 1

## 2019-12-10 MED ORDER — ALUM & MAG HYDROXIDE-SIMETH 200-200-20 MG/5ML PO SUSP: 30.0000 mL | Freq: Four times a day (QID) | ORAL | Status: DC | PRN

## 2019-12-10 MED ORDER — ASCORBIC ACID 500 MG PO TABS
1000.0000 mg | ORAL_TABLET | Freq: Every day | ORAL | Status: DC
  Administered 2019-12-11 – 2019-12-12 (×2): 1000 mg via ORAL
  Filled 2019-12-10 (×2): qty 2

## 2019-12-10 MED ORDER — PROMETHAZINE HCL 25 MG/ML IJ SOLN
6.2500 mg | INTRAMUSCULAR | Status: DC | PRN
  Administered 2019-12-10: 15:00:00 6.25 mg via INTRAVENOUS

## 2019-12-10 MED ORDER — HYDROMORPHONE HCL 1 MG/ML IJ SOLN
INTRAMUSCULAR | Status: AC
  Filled 2019-12-10: qty 1

## 2019-12-10 MED ORDER — LEVOTHYROXINE SODIUM 125 MCG PO TABS
125.0000 ug | ORAL_TABLET | Freq: Every day | ORAL | Status: DC
  Administered 2019-12-11 – 2019-12-12 (×2): 125 ug via ORAL
  Filled 2019-12-10 (×2): qty 1

## 2019-12-10 MED ORDER — CHLORHEXIDINE GLUCONATE CLOTH 2 % EX PADS: 6.0000 | MEDICATED_PAD | Freq: Once | CUTANEOUS | Status: DC

## 2019-12-10 MED ORDER — PROPOFOL 10 MG/ML IV BOLUS
INTRAVENOUS | Status: AC
  Filled 2019-12-10: qty 20

## 2019-12-10 MED ORDER — EPHEDRINE 5 MG/ML INJ
INTRAVENOUS | Status: AC
  Filled 2019-12-10: qty 10

## 2019-12-10 MED ORDER — LABETALOL HCL 5 MG/ML IV SOLN
INTRAVENOUS | Status: AC
  Filled 2019-12-10: qty 4

## 2019-12-10 MED ORDER — METOPROLOL TARTRATE 5 MG/5ML IV SOLN: 5.0000 mg | Freq: Four times a day (QID) | INTRAVENOUS | Status: DC | PRN

## 2019-12-10 MED ORDER — ACETAMINOPHEN 500 MG PO TABS
1000.0000 mg | ORAL_TABLET | Freq: Four times a day (QID) | ORAL | Status: DC
  Administered 2019-12-10 – 2019-12-12 (×6): 1000 mg via ORAL
  Filled 2019-12-10 (×7): qty 2

## 2019-12-10 MED ORDER — MIDAZOLAM HCL 2 MG/2ML IJ SOLN
INTRAMUSCULAR | Status: AC
  Filled 2019-12-10: qty 2

## 2019-12-10 MED ORDER — SUGAMMADEX SODIUM 200 MG/2ML IV SOLN
INTRAVENOUS | Status: DC | PRN
  Administered 2019-12-10: 100 mg via INTRAVENOUS

## 2019-12-10 MED ORDER — CALCIUM CARBONATE-VITAMIN D 500-200 MG-UNIT PO TABS
ORAL_TABLET | Freq: Every day | ORAL | Status: DC
  Administered 2019-12-11 – 2019-12-12 (×2): 1 via ORAL
  Filled 2019-12-10 (×2): qty 1

## 2019-12-10 MED ORDER — SODIUM CHLORIDE 0.9 % IV SOLN
INTRAVENOUS | Status: DC | PRN
  Administered 2019-12-10: 1000 mL

## 2019-12-10 MED ORDER — LABETALOL HCL 5 MG/ML IV SOLN
INTRAVENOUS | Status: DC | PRN
  Administered 2019-12-10: 2.5 mg via INTRAVENOUS

## 2019-12-10 MED ORDER — BUPIVACAINE LIPOSOME 1.3 % IJ SUSP
INTRAMUSCULAR | Status: DC | PRN
  Administered 2019-12-10: 20 mL

## 2019-12-10 MED ORDER — DIPHENHYDRAMINE HCL 50 MG/ML IJ SOLN: 12.5000 mg | Freq: Four times a day (QID) | INTRAMUSCULAR | Status: DC | PRN

## 2019-12-10 MED ORDER — PROPOFOL 10 MG/ML IV BOLUS
INTRAVENOUS | Status: DC | PRN
  Administered 2019-12-10: 110 mg via INTRAVENOUS

## 2019-12-10 MED ORDER — EPHEDRINE SULFATE-NACL 50-0.9 MG/10ML-% IV SOSY
PREFILLED_SYRINGE | INTRAVENOUS | Status: DC | PRN
  Administered 2019-12-10 (×2): 10 mg via INTRAVENOUS

## 2019-12-10 MED ORDER — ONDANSETRON HCL 4 MG PO TABS: 4.0000 mg | ORAL_TABLET | Freq: Four times a day (QID) | ORAL | Status: DC | PRN

## 2019-12-10 MED ORDER — MAGIC MOUTHWASH
15.0000 mL | Freq: Four times a day (QID) | ORAL | Status: DC | PRN
  Filled 2019-12-10: qty 15

## 2019-12-10 MED ORDER — LIP MEDEX EX OINT
1.0000 "application " | TOPICAL_OINTMENT | Freq: Two times a day (BID) | CUTANEOUS | Status: DC
  Administered 2019-12-12: 1 via TOPICAL

## 2019-12-10 MED ORDER — MIDAZOLAM HCL 5 MG/5ML IJ SOLN
INTRAMUSCULAR | Status: DC | PRN
  Administered 2019-12-10 (×2): 1 mg via INTRAVENOUS

## 2019-12-10 MED ORDER — LIDOCAINE 2% (20 MG/ML) 5 ML SYRINGE
INTRAMUSCULAR | Status: DC | PRN
  Administered 2019-12-10: 60 mg via INTRAVENOUS

## 2019-12-10 MED ORDER — VITAMIN B-12 1000 MCG PO TABS
5000.0000 ug | ORAL_TABLET | Freq: Every day | ORAL | Status: DC
  Administered 2019-12-11: 10:00:00 5000 ug via ORAL
  Filled 2019-12-10: qty 5

## 2019-12-10 MED ORDER — SODIUM CHLORIDE 0.9 % IV SOLN: Freq: Three times a day (TID) | INTRAVENOUS | Status: DC | PRN

## 2019-12-10 MED ORDER — VITAMIN D 25 MCG (1000 UNIT) PO TABS
5000.0000 [IU] | ORAL_TABLET | Freq: Every day | ORAL | Status: DC
  Administered 2019-12-11 – 2019-12-12 (×2): 5000 [IU] via ORAL
  Filled 2019-12-10 (×2): qty 5

## 2019-12-10 MED ORDER — SODIUM CHLORIDE 0.9 % IV SOLN
2.0000 g | Freq: Two times a day (BID) | INTRAVENOUS | Status: AC
  Administered 2019-12-11: 2 g via INTRAVENOUS
  Filled 2019-12-10: qty 2

## 2019-12-10 MED ORDER — FENTANYL CITRATE (PF) 250 MCG/5ML IJ SOLN
INTRAMUSCULAR | Status: AC
  Filled 2019-12-10: qty 5

## 2019-12-10 MED ORDER — ALVIMOPAN 12 MG PO CAPS
12.0000 mg | ORAL_CAPSULE | Freq: Two times a day (BID) | ORAL | Status: DC
  Administered 2019-12-11: 10:00:00 12 mg via ORAL
  Filled 2019-12-10: qty 1

## 2019-12-10 MED ORDER — PHENYLEPHRINE 40 MCG/ML (10ML) SYRINGE FOR IV PUSH (FOR BLOOD PRESSURE SUPPORT)
PREFILLED_SYRINGE | INTRAVENOUS | Status: DC | PRN
  Administered 2019-12-10: 80 ug via INTRAVENOUS

## 2019-12-10 MED ORDER — ENSURE SURGERY PO LIQD
237.0000 mL | Freq: Two times a day (BID) | ORAL | Status: DC
  Administered 2019-12-11 – 2019-12-12 (×3): 237 mL via ORAL
  Filled 2019-12-10 (×4): qty 237

## 2019-12-10 MED ORDER — ENOXAPARIN SODIUM 40 MG/0.4ML ~~LOC~~ SOLN
40.0000 mg | SUBCUTANEOUS | Status: DC
  Administered 2019-12-11 – 2019-12-12 (×2): 40 mg via SUBCUTANEOUS
  Filled 2019-12-10 (×2): qty 0.4

## 2019-12-10 MED ORDER — KETAMINE HCL 10 MG/ML IJ SOLN
INTRAMUSCULAR | Status: AC
  Filled 2019-12-10: qty 1

## 2019-12-10 MED ORDER — ONDANSETRON HCL 4 MG/2ML IJ SOLN
INTRAMUSCULAR | Status: DC | PRN
  Administered 2019-12-10: 4 mg via INTRAVENOUS

## 2019-12-10 MED ORDER — DEXAMETHASONE SODIUM PHOSPHATE 10 MG/ML IJ SOLN
INTRAMUSCULAR | Status: DC | PRN
  Administered 2019-12-10: 6 mg via INTRAVENOUS

## 2019-12-10 MED ORDER — PROMETHAZINE HCL 25 MG/ML IJ SOLN
INTRAMUSCULAR | Status: AC
  Filled 2019-12-10: qty 1

## 2019-12-10 MED ORDER — PHENYLEPHRINE 40 MCG/ML (10ML) SYRINGE FOR IV PUSH (FOR BLOOD PRESSURE SUPPORT)
PREFILLED_SYRINGE | INTRAVENOUS | Status: AC
  Filled 2019-12-10: qty 10

## 2019-12-10 MED ORDER — PROCHLORPERAZINE MALEATE 10 MG PO TABS
10.0000 mg | ORAL_TABLET | Freq: Four times a day (QID) | ORAL | Status: DC | PRN
  Filled 2019-12-10: qty 1

## 2019-12-10 MED ORDER — SODIUM CHLORIDE 0.9 % IV SOLN
2.0000 g | INTRAVENOUS | Status: AC
  Administered 2019-12-10: 2 g via INTRAVENOUS
  Filled 2019-12-10: qty 2

## 2019-12-10 MED ORDER — ZINC GLUCONATE 50 MG PO TABS: 50.0000 mg | ORAL_TABLET | Freq: Every day | ORAL | Status: DC

## 2019-12-10 MED ORDER — LIDOCAINE 2% (20 MG/ML) 5 ML SYRINGE
INTRAMUSCULAR | Status: AC
  Filled 2019-12-10: qty 5

## 2019-12-10 MED ORDER — LORAZEPAM 2 MG/ML IJ SOLN: 0.5000 mg | Freq: Three times a day (TID) | INTRAMUSCULAR | Status: DC | PRN

## 2019-12-10 MED ORDER — ACETAMINOPHEN 500 MG PO TABS
1000.0000 mg | ORAL_TABLET | ORAL | Status: AC
  Administered 2019-12-10: 12:00:00 1000 mg via ORAL
  Filled 2019-12-10: qty 2

## 2019-12-10 MED ORDER — ROCURONIUM BROMIDE 50 MG/5ML IV SOSY
PREFILLED_SYRINGE | INTRAVENOUS | Status: DC | PRN
  Administered 2019-12-10: 20 mg via INTRAVENOUS
  Administered 2019-12-10: 10 mg via INTRAVENOUS
  Administered 2019-12-10: 50 mg via INTRAVENOUS

## 2019-12-10 MED ORDER — HYDROMORPHONE HCL 1 MG/ML IJ SOLN
0.2500 mg | INTRAMUSCULAR | Status: DC | PRN
  Administered 2019-12-10 (×4): 0.5 mg via INTRAVENOUS

## 2019-12-10 MED ORDER — TRAMADOL HCL 50 MG PO TABS
50.0000 mg | ORAL_TABLET | Freq: Four times a day (QID) | ORAL | Status: DC | PRN
  Administered 2019-12-10: 50 mg via ORAL
  Administered 2019-12-11 (×2): 100 mg via ORAL
  Filled 2019-12-10 (×2): qty 2
  Filled 2019-12-10: qty 1

## 2019-12-10 MED ORDER — GABAPENTIN 100 MG PO CAPS
200.0000 mg | ORAL_CAPSULE | Freq: Three times a day (TID) | ORAL | Status: DC
  Administered 2019-12-10 – 2019-12-12 (×5): 200 mg via ORAL
  Filled 2019-12-10 (×5): qty 2

## 2019-12-10 MED ORDER — DEXAMETHASONE SODIUM PHOSPHATE 10 MG/ML IJ SOLN
INTRAMUSCULAR | Status: AC
  Filled 2019-12-10: qty 1

## 2019-12-10 MED ORDER — LIDOCAINE 2% (20 MG/ML) 5 ML SYRINGE
INTRAMUSCULAR | Status: DC | PRN
  Administered 2019-12-10: 1 mg/kg/h via INTRAVENOUS

## 2019-12-10 MED ORDER — BUPIVACAINE HCL 0.25 % IJ SOLN
INTRAMUSCULAR | Status: AC
  Filled 2019-12-10: qty 1

## 2019-12-10 MED ORDER — DIPHENHYDRAMINE HCL 12.5 MG/5ML PO ELIX: 12.5000 mg | ORAL_SOLUTION | Freq: Four times a day (QID) | ORAL | Status: DC | PRN

## 2019-12-10 MED ORDER — ALVIMOPAN 12 MG PO CAPS
12.0000 mg | ORAL_CAPSULE | ORAL | Status: AC
  Administered 2019-12-10: 12:00:00 12 mg via ORAL
  Filled 2019-12-10: qty 1

## 2019-12-10 MED ORDER — ENOXAPARIN SODIUM 40 MG/0.4ML ~~LOC~~ SOLN
40.0000 mg | Freq: Once | SUBCUTANEOUS | Status: AC
  Administered 2019-12-10: 12:00:00 40 mg via SUBCUTANEOUS
  Filled 2019-12-10: qty 0.4

## 2019-12-10 MED ORDER — ONDANSETRON HCL 4 MG/2ML IJ SOLN: 4.0000 mg | Freq: Four times a day (QID) | INTRAMUSCULAR | Status: DC | PRN

## 2019-12-10 MED ORDER — FENTANYL CITRATE (PF) 250 MCG/5ML IJ SOLN
INTRAMUSCULAR | Status: DC | PRN
  Administered 2019-12-10 (×5): 50 ug via INTRAVENOUS

## 2019-12-10 MED ORDER — HYDROMORPHONE HCL 1 MG/ML IJ SOLN
0.5000 mg | INTRAMUSCULAR | Status: DC | PRN
  Administered 2019-12-11: 1 mg via INTRAVENOUS
  Administered 2019-12-11: 0.5 mg via INTRAVENOUS
  Filled 2019-12-10 (×2): qty 1

## 2019-12-10 MED ORDER — PROCHLORPERAZINE EDISYLATE 10 MG/2ML IJ SOLN: 5.0000 mg | Freq: Four times a day (QID) | INTRAMUSCULAR | Status: DC | PRN

## 2019-12-10 SURGICAL SUPPLY — 50 items
APL PRP STRL LF DISP 70% ISPRP (MISCELLANEOUS) ×1
BLADE SURG SZ10 CARB STEEL (BLADE) ×3 IMPLANT
CHLORAPREP W/TINT 26 (MISCELLANEOUS) ×3 IMPLANT
COVER MAYO STAND STRL (DRAPES) ×3 IMPLANT
COVER WAND RF STERILE (DRAPES) IMPLANT
DECANTER SPIKE VIAL GLASS SM (MISCELLANEOUS) ×3 IMPLANT
DRAIN CHANNEL 19F RND (DRAIN) IMPLANT
DRAPE LAPAROSCOPIC ABDOMINAL (DRAPES) ×3 IMPLANT
DRAPE SHEET LG 3/4 BI-LAMINATE (DRAPES) IMPLANT
DRAPE UTILITY XL STRL (DRAPES) ×3 IMPLANT
DRAPE WARM FLUID 44X44 (DRAPES) ×3 IMPLANT
DRSG OPSITE POSTOP 4X10 (GAUZE/BANDAGES/DRESSINGS) IMPLANT
DRSG OPSITE POSTOP 4X6 (GAUZE/BANDAGES/DRESSINGS) ×2 IMPLANT
DRSG OPSITE POSTOP 4X8 (GAUZE/BANDAGES/DRESSINGS) IMPLANT
DRSG TEGADERM 2-3/8X2-3/4 SM (GAUZE/BANDAGES/DRESSINGS) ×6 IMPLANT
DRSG TEGADERM 4X4.75 (GAUZE/BANDAGES/DRESSINGS) ×3 IMPLANT
ELECT REM PT RETURN 15FT ADLT (MISCELLANEOUS) ×3 IMPLANT
GAUZE SPONGE 4X4 12PLY STRL (GAUZE/BANDAGES/DRESSINGS) ×3 IMPLANT
GLOVE ECLIPSE 8.0 STRL XLNG CF (GLOVE) ×3 IMPLANT
GLOVE INDICATOR 8.0 STRL GRN (GLOVE) ×3 IMPLANT
GOWN STRL REUS W/TWL XL LVL3 (GOWN DISPOSABLE) ×9 IMPLANT
HANDLE SUCTION POOLE (INSTRUMENTS) ×1 IMPLANT
KIT BASIN OR (CUSTOM PROCEDURE TRAY) ×3 IMPLANT
KIT TURNOVER KIT A (KITS) IMPLANT
LEGGING LITHOTOMY PAIR STRL (DRAPES) ×3 IMPLANT
PACK GENERAL/GYN (CUSTOM PROCEDURE TRAY) ×3 IMPLANT
PENCIL SMOKE EVACUATOR (MISCELLANEOUS) IMPLANT
RELOAD PROXIMATE 75MM BLUE (ENDOMECHANICALS) ×6 IMPLANT
RELOAD STAPLE 75 3.8 BLU REG (ENDOMECHANICALS) IMPLANT
SPONGE LAP 18X18 RF (DISPOSABLE) ×2 IMPLANT
STAPLER 90 3.5 STAND SLIM (STAPLE) ×3
STAPLER 90 3.5 STD SLIM (STAPLE) IMPLANT
STAPLER PROXIMATE 75MM BLUE (STAPLE) ×2 IMPLANT
STAPLER VISISTAT 35W (STAPLE) ×3 IMPLANT
SUCTION POOLE HANDLE (INSTRUMENTS) ×3
SUT MNCRL AB 4-0 PS2 18 (SUTURE) ×3 IMPLANT
SUT PDS AB 1 CTX 36 (SUTURE) ×6 IMPLANT
SUT PDS AB 1 TP1 96 (SUTURE) IMPLANT
SUT SILK 2 0 (SUTURE) ×3
SUT SILK 2 0 SH CR/8 (SUTURE) ×3 IMPLANT
SUT SILK 2-0 18XBRD TIE 12 (SUTURE) ×1 IMPLANT
SUT SILK 3 0 (SUTURE) ×3
SUT SILK 3 0 SH CR/8 (SUTURE) ×3 IMPLANT
SUT SILK 3-0 18XBRD TIE 12 (SUTURE) ×1 IMPLANT
SYR BULB IRRIGATION 50ML (SYRINGE) ×3 IMPLANT
TAPE UMBILICAL COTTON 1/8X30 (MISCELLANEOUS) ×3 IMPLANT
TOWEL OR 17X26 10 PK STRL BLUE (TOWEL DISPOSABLE) ×6 IMPLANT
TOWEL OR NON WOVEN STRL DISP B (DISPOSABLE) ×6 IMPLANT
TRAY URETHRAL CATH 14FR LF (CATHETERS) ×2 IMPLANT
YANKAUER SUCT BULB TIP 10FT TU (MISCELLANEOUS) ×3 IMPLANT

## 2019-12-10 NOTE — Anesthesia Preprocedure Evaluation (Signed)
Anesthesia Evaluation  Patient identified by MRN, date of birth, ID band Patient awake    Reviewed: Allergy & Precautions, NPO status , Patient's Chart, lab work & pertinent test results  History of Anesthesia Complications Negative for: history of anesthetic complications  Airway Mallampati: II  TM Distance: >3 FB Neck ROM: Full    Dental  (+) Dental Advisory Given   Pulmonary neg shortness of breath, neg COPD, neg recent URI, former smoker,    breath sounds clear to auscultation       Cardiovascular hypertension, Pt. on medications (-) angina(-) Past MI and (-) CHF + Valvular Problems/Murmurs  Rhythm:Regular Rate:Normal     Neuro/Psych PSYCHIATRIC DISORDERS Anxiety negative neurological ROS     GI/Hepatic Neg liver ROS, GERD  Medicated and Controlled,Colon ca   Endo/Other  Hypothyroidism   Renal/GU negative Renal ROS     Musculoskeletal negative musculoskeletal ROS (+)   Abdominal   Peds  Hematology  (+) Blood dyscrasia, anemia ,   Anesthesia Other Findings   Reproductive/Obstetrics                            Anesthesia Physical  Anesthesia Plan  ASA: II  Anesthesia Plan: General   Post-op Pain Management:    Induction: Intravenous  PONV Risk Score and Plan: 4 or greater and Ondansetron and Dexamethasone  Airway Management Planned: Oral ETT  Additional Equipment: None  Intra-op Plan:   Post-operative Plan: Extubation in OR  Informed Consent: I have reviewed the patients History and Physical, chart, labs and discussed the procedure including the risks, benefits and alternatives for the proposed anesthesia with the patient or authorized representative who has indicated his/her understanding and acceptance.     Dental advisory given  Plan Discussed with: CRNA  Anesthesia Plan Comments:        Anesthesia Quick Evaluation
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
## 2019-12-10 NOTE — H&P (Signed)
Kathleen Flores    DOB: 02-Feb-1946  Married / Language: English / Race: White  Female   `  The patient returns s/p robotically assisted ultra-low anterior resection with coloanal stapled anastomosis and diverting loop ileostomy 07/12/2019   Pathology c/w [id number] (1/22 LN) well differentiated rectal adenoCA   The patient returns to clinic by herself after surgery, gradually improving. She continues iron twice a day along with Lomotil 4 times a day. She backed off on the MiraLAX to once a day since it seemed to be loose. She is emptying her ileostomy bag about every 3 hours. She notes is not a high volume, she dislikes to keep it empty. The appliance bag is staying on for up to 3 days. Her husband changes in appliance. Her appetite is much better. She is not lightheaded or dizzy. She is drinking plenty liquids. She continues on the IV fluids through her PICC line. There is concern of worsening diarrhea off on starting Xeloda, so she was increased to 6 L a week. She has not noticed any difference in ileostomy output or concerns of diarrhea on the Xeloda with the first 2 week course. He gets a little looser output after taking the MiraLAX. Definitely thicker at night. She definitely feels much more optimistic and positive. While the ileostomy can be annoying/difficult, it is getting better tolerated.   Still needing IVF.  Ready for surgery. `  07/12/2019  12:38 PM  PATIENT: Kathleen Flores 73 y.o. 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    PROCEDURE:  ROBOTIC ULTRA LOW ANTERIOR RECTOSIGMOID RESECTION WITH COLOANAL ANASTOMOSIS  DIVERTING LOOP ILEOSTOMY  ILEOCECECTOMY  OMENTAL PEDICLE FLAP  ASSESSMENT OF TISSUE PERFUSION BY FIREFLY IMMUNOFLUORESENCE  RIGID PROCTOSCOPY  BILATERAL TAP BLOCK  SURGEON: Adin Hector, MD  ASSISTANT: Nadeen Landau, MD  An experienced assistant was required given the standard of surgical care given the complexity of the case. This assistant was needed for exposure, dissection, suctioning, retraction, instrument exchange, etc.  ANESTHESIA: General and Local anesthetic as a field block  Nerve block provided with liposomal bupivacaine (Experel) mixed with 0.25% bupivacaine as a Bilateral TAP block x 45mL each side at the level of the transverse abdominis & preperitoneal spaces along the flank at the anterior axillary line, from subcostal ridge to iliac crest under laparoscopic guidance  EBL: Total I/O  In: 2000 [I.V.:2000]  Out: 500 [Urine:450; Blood:50] "see anesthesia record"  Delay start of Pharmacological VTE agent (>24hrs) due to surgical blood loss or risk of bleeding: no  DRAINS: 19 Fr Blake drain in the pelvis  SPECIMEN: Source of Specimen: ILEOCECAL REGION and "RECTOSIGMOID COLON DISTAL ANASTOMOTIC RING  DISPOSITION OF SPECIMEN: PATHOLOGY  COUNTS: YES  PLAN OF CARE: Admit to inpatient  PATIENT DISPOSITION: PACU - hemodynamically stable.  INDICATION:  Pleasant woman found to have bulky tumor in mid/lower rectum. Underwent chemoradiation therapy. Developed partial small bowel obstruction due to ileal thickening suspicious for radiation ileitis stricture. I recommended segmental resection:  The anatomy & physiology of the digestive tract was discussed. The pathophysiology was discussed. Natural history risks without surgery  was discussed. I worked to give an overview of the disease and the frequent need to have multispecialty involvement. I feel the risks of no intervention will lead to  serious problems that outweigh the operative risks; therefore, I recommended a partial colectomy to remove the pathology. Laparoscopic & open techniques were discussed.  Risks such as bleeding, infection, abscess, leak, reoperation, possible ostomy, hernia, heart attack, death, and other risks were discussed. I noted a good likelihood this will help address the problem. Goals of post-operative recovery were discussed as well. We will work to minimize complications. Educational materials on the pathology had been given in the office. Questions were answered.  The patient expressed understanding & wished to proceed with surgery.  OR FINDINGS:  Patient had persistent bulky scarring tumor at the mid/low rectal junction. No obvious metastatic disease on visceral parietal peritoneum or liver.  The anastomosis rests 1-2 cm from the anal verge by rigid proctoscopy. It is a side colon to end 31 coloanal stapled anastomosis.  Ileal thickening x15 cm very close to the terminal ileum. Ileocecectomy done  Diverting loop ileostomy proximal to be colonic anastomosis. Proximal end cephalad  PROCEDURE:  Informed consent was confirmed. The patient underwent general anaesthesia without difficulty. The patient was positioned appropriately. VTE prevention in place. The patient's abdomen was clipped, prepped, & draped in a sterile fashion. Surgical timeout confirmed our plan.  The patient was positioned in reverse Trendelenburg. Abdominal entry was gained using optical entry technique in the right upper abdomen. Entry was clean. I induced carbon dioxide insufflation. Camera inspection revealed no injury. Extra ports were carefully placed under direct laparoscopic visualization. I reflected the greater omentum and the upper abdomen the small bowel in the upper abdomen. The patient was carefully positioned. The Intuitive daVinci robot was carefully docked with camera & instruments carefully placed.  The patient had No evidence  of visceral or parietal peritoneal metastatic disease. No evidence of metastasis. The terminal ileum was obviously thickened and there was some proximal small bowel dilatation consistent with her prior CAT scan implying partial small bowel obstruction. 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  I focused on rectosigmoid resection. I scored the base of peritoneum of the medial side of the mesentery of the left colon from the ligament of Treitz to the peritoneal reflection of the mid rectum. I elevated the sigmoid mesentery and entered into the retro-mesenteric plane. We were able to identify the left ureter and gonadal vessels. We kept those posterior within the retroperitoneum and elevated the left colon mesentery off that. I did isolated IMA pedicle but did not ligate it yet. I continued distally and got into the avascular plane posterior to the mesorectum. This allowed me to help mobilize the rectum as well by freeing the mesorectum off the sacrum. I mobilized the peritoneal coverings towards the peritoneal reflection on both the right and left sides of the rectum. I stayed away from the right and left ureters. I kept the lateral vascular pedicles to the rectum intact.  I dissected around inferior mesenteric arterial and venous pedicles. After getting good skeletonization I did a high ligation using the robotic vessel sealer to good result. Mobilized the left colon mesentery off the retroperitoneum. Kidney 1 to come up with it as well but gradually able to help free to keep it in the retroperitoneal position. Because she had rather redundant stretched out sigmoid colon, held off on splenic flexure mobilization.  Proceed with total mesorectal excision. Gradually exposed the rectum and freed  it off its attachments to the pelvis. Continued on the presacral plane down to the pelvic floor. Repositioned anteriorly. I was able to score around the peritoneal reflection which was rather low. She had  obvious radiation change and inflammation. The anterior rectal wall off the rectovaginal septum to find tattooing at the low rectum. Digital rectal examination to confirm the distal scarring in the low rectum. Further skeletonization in anticipation of resecting at the low rectum.  He around the posterior was very thin-walled poor quality. I decided transected at the low rectum to preserve a decent rectal cuff. With that I could eviscerate the rectosigmoid out of the pelvis. We will that clamped and elevated. I did a pursestring stretch around the remaining anal rectal stump with a 2-0 V lock running suture.  Then chose an area in the descending colon that usually reach down to the very low pelvis. I transected the mesentery radially, including the inferior mesenteric venous arterial pedicles to be part of the specimen. We asked anesthesia to dilute the indocyanine green (ICG) to 10 mL and inject 3 mL intravenously with IV flush. I switched to the NIR fluorescence (Firefly mode) imaging window on the daVinci platform. I was able to see good light green visualization of blood vessels with good perfusion of tissues, confirming good tissue perfusion of both ends planned for anastomosis. Stapled off green load robotic stapler at the descending/sigmoid junction where it reached well.  I then mobilized the terminal ileum and cecum in a lateral medial fashion to help make sure he could mobilize for extracorporeal extraction and inspection. Created a Pfannenstiel type extraction incision in the right upper quadrant premarked ileostomy site. Placed wound protector. Eviscerated the specimen to confirm good distal margins and good high ligation. Sent for pathology. Open and is actually distal. I made a colotomy 6 cm proximal to the staple line and placed a 31 EEA stapler anvil into it. Made a pursestring around it to tied that down. Returned out into the pelvis.  History of the ileocecal region. He did have an open fashion  a little bit to do that. Terminated was obviously very thickened with a minimally palpable lumen. Because of its persistent thickening per quite some time the patient essentially having to be on a blenderized liquid diet, I felt it was not a good idea to leave that in place. Therefore I did an ileal cecal resection. Stapled off distal ileum to mid ascending colon side to side fashion. Stapled off the common defect with a TX 90. Transected the mesentery with clamps and silk ties. Close mesenteric defect plane over the TX staple line. Barcelona style anastomosis. Specimen sent off. Placed silk stitches on the soft non-radiated ileum proximal out in anticipation of loop ileostomy.  Brought up the greater omentum and transected it off the right colon to the mid transverse colon to have a nice long omental pedicle flap that can easily reach down the pelvis. Dr. Dema Severin scrubbed down and did gentle anal dilatation. He brought the spike of the EEA stapler about the center of the pursestring rectal stump. Attached the anvil to that he tied down. Held the clamp for 90 seconds. Fired. Held for 30 seconds. Released. 2 good anastomotic rings. The distal ring sent for the final distal margin. He conditionally palpate the anastomosis that was very low. Closest posterior midline. He did not feel any defects. I placed antibiotic irrigation. He insufflated with a rigid proctoscopy and there was no evidence of any air leak. There  is no active bleeding. Consistent with an airtight anastomosis. I brought the greater omentum down to help fill up the pelvis. I placed a drain as noted above around around the very low anastomosis.  The retroperitoneum was evacuated. While I brought up the distal ileum proximal to the prior ileocecal resection anticipation diverting loop ileostomy. Good mobility. Protected the ports removed. I changed gloves. And closed the port sites with 0 Vicryl on the suprapubic stapler port site fascia. Skin closed  with 4-0 Monocryl. Sterile dressings applied. I tied down the fascia around the right upper quadrant premarked ileostomy region using interrupted #1 PDS at the corners until it was more snug. I then created a loop ileostomy with the proximal end cephalad at 12:00 to have a smaller distal end is a pseudo-mucous fistula. With 3-0 Vicryl interrupted sutures to get a good 2 cm Brooke ileostomy. Did close the lateral corner of the wound down a little bit to have more of a circular ostomy defect. Appliance placed.  Patient is being extubated go to recovery room. I discussed postop care with the patient in detail the office & in the holding area. Instructions are written. I updated the status of the patient to the patient's spouse. I made recommendations. I answered questions. Understanding & appreciation was expressed.  Adin Hector, M.D., F.A.C.S.  Gastrointestinal and Minimally Invasive Surgery  [facility]  [address] [place]  [place], [place] [address]  [phone] Main / Paging  Pathology:  `  `  Diagnosis  1. Colon, segmental resection for tumor, rectosigmoid, open-ended distal  - INVASIVE ADENOCARCINOMA, WELL DIFFERENTIATED, SPANNING 1.4 CM.  - TUMOR INVADES THROUGH MUSCULARIS PROPRIA INTO COLORECTAL ADIPOSE.  - RESECTION MARGINS ARE NEGATIVE.  - METASTATIC CARCINOMA IN ONE OF TWENTY-TWO LYMPH NODES (1/22).  - SATELLITE NODULE.  - SEE ONCOLOGY TABLE.  2. Colon, resection margin (donut), final distal margin ring  - BENIGN SQUAMOUS AND COLONIC MUCOSA.  - NO DYSPLASIA OR MALIGNANCY.  3. Small intestine, resection, ileocecum, distal ileal stricture  - BENIGN SMALL BOWEL WITH ISCHEMIC CHANGE AND ULCER.  - BENIGN COLONIC MUCOSA.  - BENIGN APPENDIX.  - SEVEN OF SEVEN LYMPH NODES NEGATIVE FOR CARCINOMA (0/7).  - NO DYSPLASIA OR MALIGNANCY.  Microscopic Comment  1. COLON AND RECTUM: Resection, Including Transanal Disk Excision of Rectal Neoplasms  Procedure:  Rectosigmoid resection.  Tumor Site: Mid rectum per requistion.  Tumor Size: 1.4 cm.  Macroscopic Tumor Perforation: Not identified.  Histologic Type: Invasive adenocarcinoma.  Histologic Grade: G1, Well-differentiated.  Tumor Extension: Through muscularis propria into pericolorectal tissue.  Margins: Negative.  Treatment Effect: Present (TRS 2).  Lymphovascular Invasion: Not identified.  Perineural Invasion: Not identified.  Tumor Deposits: Present (x1).  1 of 3  FINAL for Crosley, Nelle G [id number])  Microscopic Comment(continued)  Regional Lymph Nodes:  Number of Lymph Nodes Involved: 1  Number of Lymph Nodes Examined: 22 (regional)  Pathologic Stage Classification (pTNM, AJCC 8th Edition): ypT3, ypN1a  Ancillary Studies: Can be performed upon request.  Representative tumor block: 1E  Comments: There is a positive lymph node (1I) with extracapsular extension and the tumor extends to within 2 mm  of the radial margin.  (v4.0.1.0)  Vicente Males MD  Pathologist, Electronic Signature  (Case signed 07/16/2019)  Specimen [place] Tremain and Clinical Information  Specimen(s) Obtained:  1. Colon, segmental resection for tumor, rectosigmoid, open-ended distal  2. Colon, resection margin (donut), final distal margin ring  3. Small intestine, resection, ileocecum, distal  ileal stricture  Specimen Clinical Information  1. mid rectal cancer [rd]  Mykeria Garman  1. Specimen: Received fresh labeled rectosigmoid.  Specimen integrity: An intact portion of colon with one stapled and one opened resection margin. Per the requistion,  the open end is distal.  Specimen length: 26.5 cm.  Mesorectal intactness: Focally disrupted but appears complete.  Tumor location: Within the distal aspect of this specimen along the right posterior aspect.  Tumor size: There is a 1.4 x 1.2 cm tan white, firm, centrally depressed scar identified.  Percent of bowel circumference involved: Approximately 20%.  Tumor distance to  margins:  Proximal: 22.0 cm.  Distal: 2.1 cm.  Radial (posterior ascending, posterior descending; lateral and posterior mid-rectum;  and entire lower 1/3 rectum): 3.5 cm.  Macroscopic extent of tumor invasion: Sectioning through the scar reveals tan white, slightly fibrotic cut surface  which focally extends into the underlying adipose tissue.  Total presumed lymph nodes: Twenty-four tan pink to gray possible lymph nodes are identified, ranging from 0.1 to  0.4 cm in greatest dimension.  Extramural satellite tumor nodules: Within the mesorectum, approximately 6.0 cm proximal to the mucosal lesion  there is a 0.7 cm in greatest dimension, tan white, firm, ill defined possible identified. The possible tumor nodule  measures 0.1 cm from the radial resection margin (inked black).  Mucosal polyp(s): No other mucosal lesions are grossly identified.  Additional findings: The uninvolved mucosa is tan pink with normal folding. The mucosa surrounding the scar  displays gray-blue discoloration, suggestive of tattoo powder.  Block summary:  A= proximal margin.  2 of 3  FINAL for Rodin, Yaeli G ([id number])  Kimaya Whitlatch(continued)  B= distal margin, perpendicular to mucosal scar.  C-G= mucosal scar, entirely submitted.  H= uninvolved mucosa.  I= possible extramural tumor nodule.  J-L= five possible lymph nodes, each.  M= four possible lymph nodes.  N= three possible lymph nodes.  O= two possible lymph nodes.  2. Received fresh and consists of a circular portion of tan gray mucosa, measuring 2.6 x 1.9 cm in diameter and up to  1.4 cm in length. The exposed mucosa is tan gray and focally hemorrhagic. Representative sections are submitted in  one cassette.  3. Received fresh labeled ileocecum with distal ileal stricture, and consists of a looped portion of bowel stapled  together. The ileum measures 23.0 cm in length, and the cecum and righ colon measures 7.5 cm in length. The  serosal surface of the  ileum is tan gray to purple, with attached tan yellow adipose tissue. A length of approximately  7.0 cm is gray purple and slightly hyperemic, with indurated wall. The area of interest measures 9.5 cm from the  proximal margin. The lumen is filled with a moderate amount of tan brown partially digested material. The mucosa  within the ileum ranges from tan pink with normal folding to tan gray, hyperemic, with mild flattening of the mucosal  folds and tan white proximal exudate along the area of stricture. Ileal wall ranges from 0.2 to 0.6 cm in thickness.  The mucosa of the colon is tan pink with normal folding, and no mucosal lesion are grossly identified. The appendix  is present, measuring 3.2 cm in length x 0.6 cm in diameter. The serosal surface is tan and smooth. The mucosa of  the appendix is tan, the wall measures 0.2 cm in thickness , and the lumen is pinpoint. Six tan-pink to gray possible  lymph nodes are identified, ranging from 0.2 to  0.3 cm in greatest dimension. Representative sections are submitted  in seven cassettes.  A= proximal margin.  B= distal margin.  C,D= representative sections of ileum.  E= ileocecal valve and cecum.  F= appendix.  G= six possible lymph nodes. (KL:gt, 07/13/19)  Report signed out from the following location(s)  Technical component and interpretation was performed at [facility] [place], [place], [place] [address]. 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  Allergies Reconciled  Medication History Nance Pew, CMA; 09/03/2019 11:48 AM)  Lomotil (2.5-0.025MG  Tablet, 2 (two) Oral four times daily, Taken starting 08/13/2019) Active.  Xanax (0.5MG  Tablet, 1-2 Oral 30 minutes prior to MRI, Taken starting 03/07/2019) Active.  amLODIPine Besylate (5MG  Tablet, Oral) Active.  ALPRAZolam (0.25MG  Tablet, Oral) Active.  amLODIPine Besylate (Oral) Specific strength unknown - Active.  Losartan Potassium (Oral) Specific strength unknown - Active.  Synthroid (125MCG Tablet, Oral) Active.  Vitamin C (Oral) Specific strength unknown - Active.  Vitamin D3 (Oral) Specific strength unknown - Active.  Calcium (Oral) Specific strength unknown - Active.  Zinc (Oral) Specific strength unknown - Active.  Elderberry (Oral) Specific strength unknown - Active.  Biotin (Oral) Specific strength unknown - Active.  Medications Reconciled  Social History Adin Hector, MD; 09/03/2019 11:50 AM)  Alcohol use Moderate alcohol use.  Caffeine use Carbonated beverages.  No drug use  Tobacco use Former smoker.  Family History Adin Hector, MD; 09/03/2019 11:50 AM)  Alcohol Abuse Father.  Cancer Mother.  Heart Disease Father.  Heart disease in female family member before age 25  Hypertension Mother.  Pregnancy / Birth History Adin Hector, MD; 09/03/2019 11:50 AM)  Age at menarche 73 years.  Age of menopause 83-55  Gravida 1  Maternal age 74-25  Para 5  Other Problems Adin Hector, MD; 09/03/2019 11:50 AM)  High blood pressure  Hypercholesterolemia  Lump In Breast  Oophorectomy Bilateral.  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  General Appearance - Not in acute distress.  Voice - Normal.  Note: Alert. Well-groomed. Moving normally. Smiling. This is the best I have seen her.  Integumentary  Global Assessment  Normal Exam - Distribution of scalp and body hair is normal.  General Characteristics  Overall Skin Surface - no rashes and no suspicious lesions.  Head and Neck  Head - normocephalic, atraumatic with no lesions or palpable masses.  Face  Global Assessment - atraumatic, no absence of expression.  Neck  Global Assessment - no abnormal movements, no decreased range of motion.  Trachea - midline.  Thyroid  Gland Characteristics - non-tender.  Eye  Eyeball - Left - Extraocular movements intact, No Nystagmus - Left.  Eyeball - Right - Extraocular movements intact, No Nystagmus - Right.  Upper Eyelid - Left - No Cyanotic - Left.  Upper Eyelid - Right - No Cyanotic - Right.  Note: Wears glasses. Vision corrected  Chest and Lung Exam  Inspection  Accessory muscles - No use of accessory muscles in breathing.  Abdomen  Note: Ileostomy RUQ in place  Incisions with normal healing ridges. No active bleeding. No cellulitis. No guarding/rebound tenderness  Rectal  Note: Deferred for now  Peripheral Vascular  Upper Extremity  Inspection - Not Gangrenous, No Petechiae. Inspection - Not Gangrenous, No Petechiae.  Neurologic  Neurologic evaluation reveals - normal attention span and ability to concentrate, able to name objects and repeat phrases. Appropriate fund of knowledge and normal coordination.  Neuropsychiatric  Mental status exam performed with findings of - able to articulate well with normal speech/language, rate, volume and coherence and no evidence of  hallucinations, delusions, obsessions or homicidal/suicidal ideation.  Orientation - oriented X3.  Musculoskeletal  Global Assessment  Gait and Station - normal gait and station.  Lymphatic  General Lymphatics  Description - No Generalized lymphadenopathy.    ILEOSTOMY IN PLACE (Z93.2)  Impression: Doing better with ileostomy. Husband is doing the changings.  Ready for ileostomy TD  Current Plans  Pt Education - [employer] Free Text Education/Instructions: discussed with patient and provided information.  The patient was instructed to call back in 2 weeks with progress  The anatomy & physiology of the digestive tract was discussed. The pathophysiology was discussed. Possibility of remaining with an ostomy permanently was discussed. I offered ostomy takedown. Laparoscopic & open techniques were discussed.  Risks such as bleeding, infection, abscess, leak, reoperation, possible re-ostomy, injury to other organs, hernia, heart attack, death, and other risks were discussed. I noted a good likelihood this will help address the problem. Goals of post-operative recovery were discussed as well. We will work to minimize complications. Questions were answered. The patient expresses understanding & wishes to proceed with surgery.  Pt Education - Good bowel health  Pt Education - CCS Ostomy HCI (Eathel Pajak): discussed with patient and provided information.   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 Doing relatively well. Seems to be low volumes and thicker overall. Her appetite and thirst are improved, so I think she is keeping up with things better.  Consider stopping the MiraLAX totally since it seems to be making her bowels looser. Continue Lomotil 4 times a day. Continue iron twice a day.  Her oncologist wanted to increase the amount of IV fluids. She is getting 6 L a  week (once a day 6 times a week) 6. Because she is feeling better, think is reasonable to back off to 3 times a week and see how things go. She would finish the remaining bags over the next 3 weeks. 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 Side: 2 and anal step anastomosis and diverting loop ileostomy.  Pathology is lymph node positive,  so she would benefit from post-adjuvant chemotherapy. She is due to follow up with Dr. Malachy Mood with medical oncology look at options.  Once it is been greater than 3 months from surgery and she is more in 3 weeks off of post-adjuvant chemotherapy if she chooses that; then, plan Loop ileostomy takedown.  Did explain to her that it is common to have old blood/stool/mucous drain out the anus from the diverted colon. That usually backs off after the first few weeks. She notes aside from the larger superior bowel movement, but has not been an issue.  Current Plans  Pt Education - CCS Colorectal Cancer (AT): discussed with patient and provided information.  Consider follow up colonoscopy by your gastroenterologist, depending on your diagnosis. Call your gastroenterologist for advice  If you had a colon or rectal cancer resected by surgery, you should strongly consider getting a colonoscopy by your gastroenterologict one year after the colon cancer was removed.  If it was a benign polyp that was removed by colon resection, consider follow-up colonoscopy in about 3 years.  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, [place]  [place], [place] [address]  [phone] Main / Paging  [phone] Fax

## 2019-12-10 NOTE — Anesthesia Procedure Notes (Signed)
Procedure Name: Intubation Date/Time: 12/10/2019 1:14 PM Performed by: West Pugh, CRNA Pre-anesthesia Checklist: Patient identified, Emergency Drugs available, Suction available, Patient being monitored and Timeout performed Patient Re-evaluated:Patient Re-evaluated prior to induction Oxygen Delivery Method: Circle system utilized Preoxygenation: Pre-oxygenation with 100% oxygen Induction Type: IV induction Ventilation: Mask ventilation without difficulty Laryngoscope Size: Mac and 3 Grade View: Grade I Tube type: Oral Tube size: 7.0 mm Number of attempts: 1 Airway Equipment and Method: Stylet Placement Confirmation: ETT inserted through vocal cords under direct vision,  positive ETCO2,  CO2 detector and breath sounds checked- equal and bilateral Tube secured with: Tape Dental Injury: Teeth and Oropharynx as per pre-operative assessment

## 2019-12-10 NOTE — Transfer of Care (Signed)
Immediate Anesthesia Transfer of Care Note  Patient: Kathleen Flores  Procedure(s) Performed: TAKEDOWN OF LOOP ILEOSTOMY WITH RE-ANASTOMOSIS, EXAM UNDER ANESTHESIA (N/A )  Patient Location: PACU  Anesthesia Type:General  Level of Consciousness: awake, alert , oriented and patient cooperative  Airway & Oxygen Therapy: Patient Spontanous Breathing and Patient connected to face mask oxygen  Post-op Assessment: Report given to RN and Post -op Vital signs reviewed and stable  Post vital signs: Reviewed and stable  Last Vitals:  Vitals Value Taken Time  BP 175/83 12/10/19 1502  Temp    Pulse 69 12/10/19 1506  Resp 13 12/10/19 1506  SpO2 100 % 12/10/19 1506  Vitals shown include unvalidated device data.  Last Pain:  Vitals:   12/10/19 1147  TempSrc:   PainSc: 0-No pain      Patients Stated Pain Goal: 3 ([id number])  Complications: No apparent anesthesia complications

## 2019-12-10 NOTE — Op Note (Signed)
12/10/2019  3:17 PM  PATIENT:  Kathleen Flores  73 y.o. female  Patient Care Team: [name], [place] as PCP - General (Nurse Practitioner) Michael Boston, MD as Consulting Physician (General Surgery) Milus Banister, MD as Consulting Physician (Gastroenterology) Virgina Evener, Dawn, RN (Inactive) as Oncology Nurse Navigator Ladell Pier, MD as Consulting Physician (Oncology)  PRE-OPERATIVE DIAGNOSIS:  LOOP ILEOSTOMY IN PLACE FOR FECAL DIVERSION  POST-OPERATIVE DIAGNOSIS:  LOOP ILEOSTOMY IN PLACE FOR FECAL DIVERSION  PROCEDURE:  TAKEDOWN OF LOOP ILEOSTOMY WITH RE-ANASTOMOSIS EXAM UNDER ANESTHESIA  SURGEON:  Adin Hector, MD  ASSISTANT: RNFA   ANESTHESIA:   local and general  EBL:  Total I/O In: 1300 [I.V.:1200; IV Piggyback:100] Out: 250 [Urine:200; Blood:50]  Delay start of Pharmacological VTE agent (>24hrs) due to surgical blood loss or risk of bleeding:  no  DRAINS: none   SPECIMEN:  Loop ileostomy  DISPOSITION OF SPECIMEN:  PATHOLOGY  COUNTS:  YES  PLAN OF CARE: Admit to inpatient   PATIENT DISPOSITION:  PACU - hemodynamically stable.  INDICATION: Pleasant patient status post low anterior resection with diverting loop ileostomy to protect the very distal anastomosis.  The patient has recovered from that surgery with the anastomosis well-healed.  It was felt safe to have the loop ileostomy taken down.  I discussed the procedure with the patient:  The anatomy & physiology of the digestive tract was discussed.  The pathophysiology was discussed.  Possibility of remaining with an ostomy permanently was discussed.  I offered ostomy takedown.  Laparoscopic & open techniques were discussed.   Risks such as bleeding, infection, abscess, leak, reoperation, possible re-ostomy, injury to other organs, hernia, heart attack, death, and other risks were discussed.   I noted a good likelihood this will help address the problem.  Goals of post-operative recovery were discussed  as well.  We will work to minimize complications.  Questions were answered.  The patient expresses understanding & wishes to proceed with surgery.  OR FINDINGS:   Side-to-side staple anastomosis [place] style of distal ileum.  Normal anatomy.  DESCRIPTION:   Informed consent was confirmed.   The patient received IV antibiotics & underwent general anesthesia without any difficulty.  Foley catheter was sterilely placed. SCDs were active during the entire case.  I did an examination under anesthesia transrectally.  Sphincter tone intact and normal.  I can feel the EEA anastomosis about 3 cm from the anal verge without any stricturing.  No retained mucus in the neorectum.  No stricture.  Felt no significant abnormalities.  I detected no contraindication with proceeding with ostomy takedown.  The abdomen was prepped and draped in a sterile fashion.  A surgical timeout confirmed our plan.  I made a biconcave curvilinear incision transversely around the loop ileostomy as well as some chronically thickened ulcerated skin.  I got into the subcutaneous tissues.  I used careful focused right angle dissection and sharp dissection.  Some focused cautery dissection as well.  That helped to free adhesions to the subcutaneous tisses & fascia.  Gradually, I was able to enter into the peritoneum focally.  I did a gentle finger sweep.  Gradually came around circumferentially and freed the loop of ileum from remaining adhesions to the abdominal wall.  We were able eviscerate some bowel proximally and distally.    I did a side-to-side stapled anastomosis using a 75 GIA.  Silk stitch placed at the crotch of the anastomosis. I used a TX-90 stapler to staple off the common defect  and resect the remaining ileum, most of it that involve the intra-abdominal ostomy component given the adhesions. . Took the mesentery with clamps and silk ties.  I closed the mesenteric defect using interrupted silk stitches.  Using the redundant  mesentery to help cover and protect the TX staple line.  The anastomosis looked healthy and viable.   We returned the anastomosis into the abdominal cavity.  Finger sweep circumferentially noted no adhesions.  It rested well.  We changed gloves and instruments.  Irrigated copiously into the wound and fascia.  I reapproximated the fascia transversely using #1 PDS running suture.  I irrigated into the subcutaneous tissues.   The wound seem to come together more obliquely.   I did to a layer of interrupted 2-0 Vicryl deep dermal sutures to help close the wound and dead space down.  I reapproximated the skin using 4-0 Monocryl stitch running centrally.  I excised some skin and subcutaneous tissue at the corners to eliminate dog earring.  I left the corners open.  I packed the corners with anitbiotic soaked umbilical tape for wicks.  Sterile dressing was applied.  Patient was extubated and is stable in the recovery room.  I discussed operative findings, updated the patient's status, discussed probable steps to recovery, and gave postoperative recommendations to the patient's spouse, Gershon Mussel. Recommendations were made.  Questions were answered.  He expressed understanding & appreciation.   Adin Hector, M.D., F.A.C.S. Gastrointestinal and Minimally Invasive Surgery [facility] [address] [place], [place] [address] [phone] Main / Paging

## 2019-12-11 ENCOUNTER — Encounter: Payer: Self-pay | Admitting: *Deleted

## 2019-12-11 LAB — CBC
HCT: 25.6 % — ABNORMAL LOW (ref 36.0–46.0)
Hemoglobin: 8.7 g/dL — ABNORMAL LOW (ref 12.0–15.0)
MCH: 33.5 pg (ref 26.0–34.0)
MCHC: 34 g/dL (ref 30.0–36.0)
MCV: 98.5 fL (ref 80.0–100.0)
Platelets: 291 10*3/uL (ref 150–400)
RBC: 2.6 MIL/uL — ABNORMAL LOW (ref 3.87–5.11)
RDW: 14.7 % (ref 11.5–15.5)
WBC: 7.3 10*3/uL (ref 4.0–10.5)
nRBC: 0 % (ref 0.0–0.2)

## 2019-12-11 LAB — MAGNESIUM: Magnesium: 1.5 mg/dL — ABNORMAL LOW (ref 1.7–2.4)

## 2019-12-11 LAB — BASIC METABOLIC PANEL
Anion gap: 11 (ref 5–15)
BUN: 12 mg/dL (ref 8–23)
CO2: 27 mmol/L (ref 22–32)
Calcium: 8.8 mg/dL — ABNORMAL LOW (ref 8.9–10.3)
Chloride: 93 mmol/L — ABNORMAL LOW (ref 98–111)
Creatinine, Ser: 0.72 mg/dL (ref 0.44–1.00)
GFR calc Af Amer: >60 mL/min (ref >60)
GFR calc non Af Amer: >60 mL/min (ref >60)
Glucose, Bld: 125 mg/dL — ABNORMAL HIGH (ref 70–99)
Potassium: 4.1 mmol/L (ref 3.5–5.1)
Sodium: 131 mmol/L — ABNORMAL LOW (ref 135–145)

## 2019-12-11 MED ORDER — TRAMADOL HCL 50 MG PO TABS: 50.0000 mg | ORAL_TABLET | Freq: Four times a day (QID) | ORAL | 0 refills | Status: DC | PRN

## 2019-12-11 MED ORDER — PSYLLIUM 95 % PO PACK
1.0000 | PACK | Freq: Two times a day (BID) | ORAL | Status: DC
  Administered 2019-12-11 – 2019-12-12 (×3): 1 via ORAL
  Filled 2019-12-11 (×4): qty 1

## 2019-12-11 MED ORDER — SODIUM CHLORIDE 0.9% FLUSH: 3.0000 mL | INTRAVENOUS | Status: DC | PRN

## 2019-12-11 MED ORDER — SODIUM CHLORIDE 0.9% FLUSH
3.0000 mL | Freq: Two times a day (BID) | INTRAVENOUS | Status: DC
  Administered 2019-12-11: 3 mL via INTRAVENOUS

## 2019-12-11 MED ORDER — SODIUM CHLORIDE 0.9 % IV SOLN: 250.0000 mL | INTRAVENOUS | Status: DC | PRN

## 2019-12-11 NOTE — Anesthesia Postprocedure Evaluation (Signed)
Anesthesia Post Note  Patient: Kathleen Flores  Procedure(s) Performed: TAKEDOWN OF LOOP ILEOSTOMY WITH RE-ANASTOMOSIS, EXAM UNDER ANESTHESIA (N/A )     Patient location during evaluation: PACU Anesthesia Type: General Level of consciousness: sedated and patient cooperative Pain management: pain level controlled Vital Signs Assessment: post-procedure vital signs reviewed and stable Respiratory status: spontaneous breathing Cardiovascular status: stable Anesthetic complications: no    Last Vitals:  Vitals:   12/11/19 0603 12/11/19 1400  BP: (!) 119/45 (!) 138/58  Pulse: 74 81  Resp: 18 16  Temp: 36.7 C 37.1 C  SpO2: 100%     Last Pain:  Vitals:   12/11/19 1400  TempSrc: Oral  PainSc:                  Nolon Nations

## 2019-12-11 NOTE — Discharge Instructions (Signed)
SURGERY: POST OP INSTRUCTIONS (Surgery for small bowel obstruction, colon resection, etc)   ######################################################################  EAT Gradually transition to a high fiber diet with a fiber supplement over the next few days after discharge  WALK Walk an hour a day.  Control your pain to do that.    CONTROL PAIN Control pain so that you can walk, sleep, tolerate sneezing/coughing, go up/down stairs.  HAVE A BOWEL MOVEMENT DAILY Keep your bowels regular to avoid problems.  OK to try a laxative to override constipation.  OK to use an antidairrheal to slow down diarrhea.  Call if not better after 2 tries  CALL IF YOU HAVE PROBLEMS/CONCERNS Call if you are still struggling despite following these instructions. Call if you have concerns not answered by these instructions  ######################################################################   DIET Follow a light diet the first few days at home.  Start with a bland diet such as soups, liquids, starchy foods, low fat foods, etc.  If you feel full, bloated, or constipated, stay on a ful liquid or pureed/blenderized diet for a few days until you feel better and no longer constipated. Be sure to drink plenty of fluids every day to avoid getting dehydrated (feeling dizzy, not urinating, etc.). Gradually add a fiber supplement to your diet over the next week.  Gradually get back to a regular solid diet.  Avoid fast food or heavy meals the first week as you are more likely to get nauseated. It is expected for your digestive tract to need a few months to get back to normal.  It is common for your bowel movements and stools to be irregular.  You will have occasional bloating and cramping that should eventually fade away.  Until you are eating solid food normally, off all pain medications, and back to regular activities; your bowels will not be normal. Focus on eating a low-fat, high fiber diet the rest of your life  (See Getting to [place], below).  CARE of your INCISION or WOUND  REMOVE YOUR DRESSING & SHOELACE WICKS IN THE INCISION BY 12/31  It is good for closed incision and even open wounds to be washed every day.  Shower every day.  Short baths are fine.  Wash the incisions and wounds clean with soap & water.  You may leave closed incisions open to air if it is dry.   You may cover the incision with clean gauze & replace it after your daily shower for comfort.   ACTIVITIES as tolerated Start light daily activities --- self-care, walking, climbing stairs-- beginning the day after surgery.  Gradually increase activities as tolerated.  Control your pain to be active.  Stop when you are tired.  Ideally, walk several times a day, eventually an hour a day.   Most people are back to most day-to-day activities in a few weeks.  It takes 4-8 weeks to get back to unrestricted, intense activity. If you can walk 30 minutes without difficulty, it is safe to try more intense activity such as jogging, treadmill, bicycling, low-impact aerobics, swimming, etc. Save the most intensive and strenuous activity for last (Usually 4-8 weeks after surgery) such as sit-ups, heavy lifting, contact sports, etc.  Refrain from any intense heavy lifting or straining until you are off narcotics for pain control.  You will have off days, but things should improve week-by-week. DO NOT PUSH THROUGH PAIN.  Let pain be your guide: If it hurts to do something, don't do it.  Pain is your body warning  you to avoid that activity for another week until the pain goes down. You may drive when you are no longer taking narcotic prescription pain medication, you can comfortably wear a seatbelt, and you can safely make sudden turns/stops to protect yourself without hesitating due to pain. You may have sexual intercourse when it is comfortable. If it hurts to do something, stop.  MEDICATIONS Take your usually prescribed home medications unless  otherwise directed.   Blood thinners:  Usually you can restart any strong blood thinners after the second postoperative day.  It is OK to take aspirin right away.     If you are on strong blood thinners (warfarin/Coumadin, Plavix, Xerelto, Eliquis, Pradaxa, etc), discuss with your surgeon, medicine PCP, and/or cardiologist for instructions on when to restart the blood thinner & if blood monitoring is needed (PT/INR blood check, etc).     PAIN CONTROL Pain after surgery or related to activity is often due to strain/injury to muscle, tendon, nerves and/or incisions.  This pain is usually short-term and will improve in a few months.  To help speed the process of healing and to get back to regular activity more quickly, DO THE FOLLOWING THINGS TOGETHER: 1. Increase activity gradually.  DO NOT PUSH THROUGH PAIN 2. Use Ice and/or Heat 3. Try Gentle Massage and/or Stretching 4. Take over the counter pain medication 5. Take Narcotic prescription pain medication for more severe pain  Good pain control = faster recovery.  It is better to take more medicine to be more active than to stay in bed all day to avoid medications. 1.  Increase activity gradually Avoid heavy lifting at first, then increase to lifting as tolerated over the next 6 weeks. Do not "push through" the pain.  Listen to your body and avoid positions and maneuvers than reproduce the pain.  Wait a few days before trying something more intense Walking an hour a day is encouraged to help your body recover faster and more safely.  Start slowly and stop when getting sore.  If you can walk 30 minutes without stopping or pain, you can try more intense activity (running, jogging, aerobics, cycling, swimming, treadmill, sex, sports, weightlifting, etc.) Remember: If it hurts to do it, then don't do it! 2. Use Ice and/or Heat You will have swelling and bruising around the incisions.  This will take several weeks to resolve. Ice packs or heating  pads (6-8 times a day, 30-60 minutes at a time) will help sooth soreness & bruising. Some people prefer to use ice alone, heat alone, or alternate between ice & heat.  Experiment and see what works best for you.  Consider trying ice for the first few days to help decrease swelling and bruising; then, switch to heat to help relax sore spots and speed recovery. Shower every day.  Short baths are fine.  It feels good!  Keep the incisions and wounds clean with soap & water.   3. Try Gentle Massage and/or Stretching Massage at the area of pain many times a day Stop if you feel pain - do not overdo it 4. Take over the counter pain medication This helps the muscle and nerve tissues become less irritable and calm down faster Choose ONE of the following over-the-counter anti-inflammatory medications: Acetaminophen 500mg  tabs (Tylenol) 1-2 pills with every meal and just before bedtime (avoid if you have liver problems or if you have acetaminophen in you narcotic prescription) Naproxen 220mg  tabs (ex. Aleve, Naprosyn) 1-2 pills twice a day (avoid if  you have kidney, stomach, IBD, or bleeding problems) Ibuprofen 200mg  tabs (ex. Advil, Motrin) 3-4 pills with every meal and just before bedtime (avoid if you have kidney, stomach, IBD, or bleeding problems) Take with food/snack several times a day as directed for at least 2 weeks to help keep pain / soreness down & more manageable. 5. Take Narcotic prescription pain medication for more severe pain A prescription for strong pain control is often given to you upon discharge (for example: oxycodone/Percocet, hydrocodone/Norco/Vicodin, or tramadol/Ultram) Take your pain medication as prescribed. Be mindful that most narcotic prescriptions contain Tylenol (acetaminophen) as well - avoid taking too much Tylenol. If you are having problems/concerns with the prescription medicine (does not control pain, nausea, vomiting, rash, itching, etc.), please call us [phone]  to see if we need to switch you to a different pain medicine that will work better for you and/or control your side effects better. If you need a refill on your pain medication, you must call the office before 4 pm and on weekdays only.  By federal law, prescriptions for narcotics cannot be called into a pharmacy.  They must be filled out on paper & picked up from our office by the patient or authorized caretaker.  Prescriptions cannot be filled after 4 pm nor on weekends.    WHEN TO CALL us [phone] Severe uncontrolled or worsening pain  Fever over 101 F (38.5 C) Concerns with the incision: Worsening pain, redness, rash/hives, swelling, bleeding, or drainage Reactions / problems with new medications (itching, rash, hives, nausea, etc.) Nausea and/or vomiting Difficulty urinating Difficulty breathing Worsening fatigue, dizziness, lightheadedness, blurred vision Other concerns If you are not getting better after two weeks or are noticing you are getting worse, contact our office [phone] for further advice.  We may need to adjust your medications, re-evaluate you in the office, send you to the emergency room, or see what other things we can do to help. The clinic staff is available to answer your questions during regular business hours (8:30am-5pm).  Please don't hesitate to call and ask to speak to one of our nurses for clinical concerns.    A surgeon from [facility] is always on call at the hospitals 24 hours/day If you have a medical emergency, go to the nearest emergency room or call 911.  FOLLOW UP in our office One the day of your discharge from the hospital (or the next business weekday), please call [facility] to set up or confirm an appointment to see your surgeon in the office for a follow-up appointment.  Usually it is 2-3 weeks after your surgery.   If you have skin staples at your incision(s), let the office know so we can set up a time in the  office for the nurse to remove them (usually around 10 days after surgery). Make sure that you call for appointments the day of discharge (or the next business weekday) from the hospital to ensure a convenient appointment time. IF YOU HAVE DISABILITY OR FAMILY LEAVE FORMS, BRING THEM TO THE OFFICE FOR PROCESSING.  DO NOT GIVE THEM TO YOUR DOCTOR.  [facility] [address] [place], [place], [place]  [address] ? [phone] - Main [phone] - [place],  [phone] - Fax [email or web address]  GETTING TO [facility]. It is expected for your digestive tract to need a few months to get back to normal.  It is common for your bowel movements and stools  to be irregular.  You will have occasional bloating and cramping that should eventually fade away.  Until you are eating solid food normally, off all pain medications, and back to regular activities; your bowels will not be normal.   Avoiding constipation The goal: ONE SOFT BOWEL MOVEMENT A DAY!    Drink plenty of fluids.  Choose water first. TAKE A FIBER SUPPLEMENT EVERY DAY THE REST OF YOUR LIFE During your first week back home, gradually add back a fiber supplement every day Experiment which form you can tolerate.   There are many forms such as powders, tablets, wafers, gummies, etc Psyllium bran (Metamucil), methylcellulose (Citrucel), Miralax or Glycolax, Benefiber, Flax Seed.  Adjust the dose week-by-week (1/2 dose/day to 6 doses a day) until you are moving your bowels 1-2 times a day.  Cut back the dose or try a different fiber product if it is giving you problems such as diarrhea or bloating. Sometimes a laxative is needed to help jump-start bowels if constipated until the fiber supplement can help regulate your bowels.  If you are tolerating eating & you are farting, it is okay to try a gentle laxative such as double dose MiraLax, prune juice, or Milk of Magnesia.  Avoid using laxatives too  often. Stool softeners can sometimes help counteract the constipating effects of narcotic pain medicines.  It can also cause diarrhea, so avoid using for too long. If you are still constipated despite taking fiber daily, eating solids, and a few doses of laxatives, call our office. Controlling diarrhea Try drinking liquids and eating bland foods for a few days to avoid stressing your intestines further. Avoid dairy products (especially milk & ice cream) for a short time.  The intestines often can lose the ability to digest lactose when stressed. Avoid foods that cause gassiness or bloating.  Typical foods include beans and other legumes, cabbage, broccoli, and dairy foods.  Avoid greasy, spicy, fast foods.  Every person has some sensitivity to other foods, so listen to your body and avoid those foods that trigger problems for you. Probiotics (such as active yogurt, Align, etc) may help repopulate the intestines and colon with normal bacteria and calm down a sensitive digestive tract Adding a fiber supplement gradually can help thicken stools by absorbing excess fluid and retrain the intestines to act more normally.  Slowly increase the dose over a few weeks.  Too much fiber too soon can backfire and cause cramping & bloating. It is okay to try and slow down diarrhea with a few doses of antidiarrheal medicines.   Bismuth subsalicylate (ex. Kayopectate, Pepto Bismol) for a few doses can help control diarrhea.  Avoid if pregnant.   Loperamide (Imodium) can slow down diarrhea.  Start with one tablet (2mg ) first.  Avoid if you are having fevers or severe pain.  ILEOSTOMY PATIENTS WILL HAVE CHRONIC DIARRHEA since their colon is not in use.    Drink plenty of liquids.  You will need to drink even more glasses of water/liquid a day to avoid getting dehydrated. Record output from your ileostomy.  Expect to empty the bag every 3-4 hours at first.  Most people with a permanent ileostomy empty their bag 4-6 times  at the least.   Use antidiarrheal medicine (especially Imodium) several times a day to avoid getting dehydrated.  Start with a dose at bedtime & breakfast.  Adjust up or down as needed.  Increase antidiarrheal medications as directed to avoid emptying the bag more than 8 times a  day (every 3 hours). Work with your wound ostomy nurse to learn care for your ostomy.  See ostomy care instructions. TROUBLESHOOTING IRREGULAR BOWELS 1) Start with a soft & bland diet. No spicy, greasy, or fried foods.  2) Avoid gluten/wheat or dairy products from diet to see if symptoms improve. 3) Miralax 17gm or flax seed mixed in [place]. water or juice-daily. May use 2-4 times a day as needed. 4) Gas-X, Phazyme, etc. as needed for gas & bloating.  5) Prilosec (omeprazole) over-the-counter as needed 6)  Consider probiotics (Align, Activa, etc) to help calm the bowels down  Call your doctor if you are getting worse or not getting better.  Sometimes further testing (cultures, endoscopy, X-ray studies, CT scans, bloodwork, etc.) may be needed to help diagnose and treat the cause of the diarrhea. [facility], [place], [place], [place], [place]  [address] [phone] - Main.    [phone]  - Toll Free.   [phone] - Fax [email or web address]   Pelvic floor muscle training exercises ("Kegels") can help strengthen the muscles under the uterus, bladder, and bowel (large intestine). They can help both men and women who have problems with urine leakage or bowel control.  A pelvic floor muscle training exercise is like pretending that you have to urinate, and then holding it. You relax and tighten the muscles that control urine flow. It's important to find the right muscles to tighten.  The next time you have to urinate, start to go and then stop. Feel the muscles in your vagina, bladder, or anus get tight and move up. These are the pelvic floor muscles. If you feel them tighten,  you've done the exercise right. If you are still not sure whether you are tightening the right muscles, keep in mind that all of the muscles of the pelvic floor relax and contract at the same time. Because these muscles control the bladder, rectum, and vagina, the following tips may help: Women: Insert a finger into your vagina. Tighten the muscles as if you are holding in your urine, then let go. You should feel the muscles tighten and move up and down.  Men: Insert a finger into your rectum. Tighten the muscles as if you are holding in your urine, then let go. You should feel the muscles tighten and move up and down. These are the same muscles you would tighten if you were trying to prevent yourself from passing gas.  It is very important that you keep the following muscles relaxed while doing pelvic floor muscle training exercises: Abdominal  Buttocks (the deeper, anal sphincter muscle should contract)  Thigh   A woman can also strengthen these muscles by using a vaginal cone, which is a weighted device that is inserted into the vagina. Then you try to tighten the pelvic floor muscles to hold the device in place. If you are unsure whether you are doing the pelvic floor muscle training correctly, you can use biofeedback and electrical stimulation to help find the correct muscle group to work. Biofeedback is a method of positive reinforcement. Electrodes are placed on the abdomen and along the anal area. Some therapists place a sensor in the vagina in women or anus in men to monitor the contraction of pelvic floor muscles.  A monitor will display a graph showing which muscles are contracting and which are at rest. The therapist can help find the right muscles for performing pelvic floor muscle training exercises.   PERFORMING PELVIC FLOOR  EXERCISES: 1. Begin by emptying your bladder. 2. Tighten the pelvic floor muscles and hold for a count of 10. 3. Relax the muscles completely for a count of  10. 4. Do 10 repititions, 3 to 5 times a day (morning, afternoon, and night). You can do these exercises at any time and any place. Most people prefer to do the exercises while lying down or sitting in a chair. After 4 - 6 weeks, most people notice some improvement. It may take as long as 3 months to see a major change. After a couple of weeks, you can also try doing a single pelvic floor contraction at times when you are likely to leak (for example, while getting out of a chair). A word of caution: Some people feel that they can speed up the progress by increasing the number of repetitions and the frequency of exercises. However, over-exercising can instead cause muscle fatigue and increase urine leakage. If you feel any discomfort in your abdomen or back while doing these exercises, you are probably doing them wrong. Breathe deeply and relax your body when you are doing these exercises. Make sure you are not tightening your stomach, thigh, buttock, or chest muscles. When done the right way, pelvic floor muscle exercises have been shown to be very effective at improving urinary continence.  Pelvic Floor Pain / Incontinence  Do you suffer from pelvic pain or incontinence? Do you have pain in the pelvis, low back or hips that is associated with sitting, walking, urination or intercourse? Have you experienced leaking of urine or feces when coughing, sneezing or laughing? Do you have pain in the pelvic area associated with cancer?  These are conditions that are common with pelvic floor muscle dysfunction. Over time, due to stress, scar tissue, surgeries and the natural course of aging, our muscles may become weak or overstressed and can spasm. This can lead to pain, weakness, incontinence or decreased quality of life.  Men and women with pelvic floor dysfunction frequently describe:  A "falling out" feeling. Pain or burning in the abdomen, tailbone or perineal area. Constipation or bowel elimination  problems or difficulty initiating urination. Unresolved low back or hip pain. Frequency and urgency when going to the bathroom. Leaking of urine or feces. Pain with intercourse.  [email or web address]  To make a referral or for more information about [facility] Pelvic Floor Therapy Program, call  [facility]) - [place] [phone] [place]) - [place] [facility]) - [phone]

## 2019-12-11 NOTE — Progress Notes (Addendum)
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  Foley out.  Bedside commode.  Advanced to solids as tolerated.  Psyllium to help bulk up stools.  Kegel exercises and bedside commode as needed.  Some urge fecal incontinence not uncommon at first but should gradually improve as the rectum can stretch back out  Honeycomb surgical dressing on until postop day #3 per colorectal protocol, 12/31.  Can remove dressing and wicks in place with gauze as needed.    Anxiolysis  VTE prophylaxis- SCDs, etc  Mobilize as tolerated to help recovery  D/C patient from hospital when patient meets criteria (anticipate in 1-2 day(s)):  Tolerating oral intake well Ambulating well Adequate pain control without IV medications Urinating  Having  flatus Disposition planning in place   30 minutes spent in review, evaluation, examination, counseling, and coordination of care.  More than 50% of that time was spent in counseling.  I updated the patient's status to the patient.  Recommendations were made.  Questions were answered.  She expressed understanding & appreciation.   12/11/2019    Subjective: (Chief complaint)  Tolerating pured diet.  Wants solid.  No nausea or vomiting.  Had loose bowel movement.  Could not control it and had "accident" in bed.  Denies pain.  Objective:  Vital signs:  Vitals:   12/10/19 2010 12/11/19 0144 12/11/19 0603 12/11/19 0702  BP: (!) 118/54 (!) 120/46 (!) 119/45   Pulse: 74 80 74   Resp: 18 16 18    Temp: (!) 97.4 F (36.3 C) 98 F (36.7 C) 98.1 F (36.7 C)   TempSrc: Oral Oral Oral   SpO2: 100% 100% 100%   Weight:    59.5 kg  Height:        Last BM Date: 12/10/19  Intake/Output   Yesterday:  12/28 0701 - 12/29 0700 In: 2681.8 [P.O.:120; I.V.:2437.5; IV Piggyback:124.3] Out: 2150 [Urine:2100; Blood:50] This shift:  No intake/output data recorded.  Bowel function:  Flatus: YES  BM:  YES  Drain: (No drain)   Physical Exam:  General: Pt awake/alert/oriented x4 in no acute distress Eyes: PERRL, normal EOM.  Sclera clear.  No icterus Neuro: CN II-XII intact w/o focal sensory/motor deficits. Lymph: No head/neck/groin lymphadenopathy Psych:  No delerium/psychosis/paranoia HENT: Normocephalic, Mucus membranes moist.  No thrush Neck: Supple, No tracheal deviation Chest: No chest wall pain w good excursion CV:  Pulses intact.  Regular rhythm MS: Normal AROM mjr joints.  No obvious deformity  Abdomen: Soft.  Nondistended.  Mildly tender at incisions only.  No evidence of peritonitis.  No incarcerated hernias.  Ext:  No deformity.  No mjr edema.  No cyanosis Skin: No petechiae / purpura  Results:   Cultures: Recent Results (from the past 720 hour(s))  Novel  Coronavirus, NAA (Hosp order, Send-out to Ref Lab; TAT 18-24 hrs     Status: None   Collection Time: 12/06/19 10:58 AM   Specimen: Nasopharyngeal Swab; Respiratory  Result Value Ref Range Status   SARS-CoV-2, NAA NOT DETECTED NOT DETECTED Final    Comment: (NOTE) This nucleic acid amplification test was developed and its performance characteristics determined by [employer]. Nucleic acid amplification tests include PCR and TMA. This test has not been FDA cleared or approved. This test has been authorized by FDA under an Emergency Use Authorization (EUA). This test is only authorized for the duration of time the declaration that circumstances exist justifying the authorization of the emergency use of in vitro diagnostic tests for detection of SARS-CoV-2 virus and/or diagnosis of COVID-19 infection under section 564(b)(1) of the Act, 21 U.S.C. [id number]) (1), unless the authorization is terminated or revoked sooner. When diagnostic testing is negative, the possibility of a false negative result should be considered in the context of a patient's recent exposures and the presence of clinical signs and symptoms consistent with COVID-19. An individual without symptoms of COVID- 19 and who is not shedding SARS-CoV-2 vi rus would expect to have a negative (not detected) result in this assay. Performed At: [facility] [address] [place], [place] [id number] Rush Farmer MD [id number]    [place]  Final    Comment: Performed at [facility], [place] [address], [place], [place] [address]    Labs: Results for orders placed or performed during the hospital encounter of 12/10/19 (from the past 48 hour(s))  Basic metabolic panel     Status: Abnormal   Collection Time: 12/10/19 12:00 PM  Result Value Ref Range   Sodium 124 (L) 135 - 145 mmol/L   Potassium 4.1 3.5 - 5.1 mmol/L   Chloride 87 (L) 98 - 111 mmol/L   CO2 24 22 - 32 mmol/L    Glucose, Bld 107 (H) 70 - 99 mg/dL   BUN 18 8 - 23 mg/dL   Creatinine, Ser 0.78 0.44 - 1.00 mg/dL   Calcium 9.3 8.9 - 10.3 mg/dL   GFR calc non Af Amer >60 >60 mL/min   GFR calc Af Amer >60 >60 mL/min   Anion gap 13 5 - 15    Comment: Performed at [facility], [place] [address] [place], [place] [address]  Type and screen [place]     Status: None   Collection Time: 12/10/19 12:00 PM  Result Value Ref Range   ABO/RH(D) O POS    Antibody Screen NEG    Sample Expiration      12/13/2019,2359 Performed at [facility], [place] [address], [place], [place] [id number]   Basic metabolic panel     Status: Abnormal   Collection Time: 12/11/19  4:22 AM  Result Value Ref Range   Sodium 131 (L) 135 - 145 mmol/L    Comment: DELTA CHECK NOTED   Potassium 4.1 3.5 - 5.1 mmol/L   Chloride 93 (L) 98 - 111 mmol/L   CO2 27 22 - 32 mmol/L   Glucose, Bld 125 (H) 70 - 99 mg/dL  BUN 12 8 - 23 mg/dL   Creatinine, Ser 0.72 0.44 - 1.00 mg/dL   Calcium 8.8 (L) 8.9 - 10.3 mg/dL   GFR calc non Af Amer >60 >60 mL/min   GFR calc Af Amer >60 >60 mL/min   Anion gap 11 5 - 15    Comment: Performed at [facility], [place] [address], [place], [place] [address]  CBC     Status: Abnormal   Collection Time: 12/11/19  4:22 AM  Result Value Ref Range   WBC 7.3 4.0 - 10.5 K/uL   RBC 2.60 (L) 3.87 - 5.11 MIL/uL   Hemoglobin 8.7 (L) 12.0 - 15.0 g/dL   HCT 25.6 (L) 36.0 - 46.0 %   MCV 98.5 80.0 - 100.0 fL   MCH 33.5 26.0 - 34.0 pg   MCHC 34.0 30.0 - 36.0 g/dL   RDW 14.7 11.5 - 15.5 %   Platelets 291 150 - 400 K/uL   nRBC 0.0 0.0 - 0.2 %    Comment: Performed at [facility], [place] [address], [place], [place] [address]  Magnesium     Status: Abnormal   Collection Time: 12/11/19  4:22 AM  Result Value Ref Range   Magnesium 1.5 (L) 1.7 - 2.4 mg/dL    Comment: Performed at [facility], [place] [address],  [place], [place] [address]    Imaging / Studies: No results found.  Medications / Allergies: per chart  Antibiotics: Anti-infectives (From admission, onward)   Start     Dose/Rate Route Frequency Ordered Stop   12/11/19 0100  cefoTEtan (CEFOTAN) 2 g in sodium chloride 0.9 % 100 mL IVPB     2 g 200 mL/hr over 30 Minutes Intravenous Every 12 hours 12/10/19 1704 12/11/19 0221   12/10/19 1429  clindamycin (CLEOCIN) 900 mg, gentamicin (GARAMYCIN) 240 mg in sodium chloride 0.9 % 1,000 mL for intraperitoneal lavage  Status:  Discontinued       As needed 12/10/19 1429 12/10/19 1647   12/10/19 1130  cefoTEtan (CEFOTAN) 2 g in sodium chloride 0.9 % 100 mL IVPB     2 g 200 mL/hr over 30 Minutes Intravenous On call to O.R. 12/10/19 1118 12/10/19 1345   12/10/19 0600  clindamycin (CLEOCIN) 900 mg, gentamicin (GARAMYCIN) 240 mg in sodium chloride 0.9 % 1,000 mL for intraperitoneal lavage  Status:  Discontinued      Irrigation To Surgery 12/09/19 [id number] 12/10/19 1647        Note: Portions of this report may have been transcribed using voice recognition software. Every effort was made to ensure accuracy; however, inadvertent computerized transcription errors may be present.   Any transcriptional errors that result from this process are unintentional.     Adin Hector, MD, FACS, MASCRS Gastrointestinal and Minimally Invasive Surgery    [address], [place], [place] [address] [phone] Main / Paging [phone] Fax

## 2019-12-12 LAB — SURGICAL PATHOLOGY

## 2019-12-12 MED ORDER — GABAPENTIN 100 MG PO CAPS: 200.0000 mg | ORAL_CAPSULE | Freq: Three times a day (TID) | ORAL | 1 refills | Status: DC

## 2019-12-12 NOTE — Discharge Summary (Signed)
[facility]/Trauma Discharge Summary   Patient ID: JEN EVATT MRN: [id number] DOB/AGE: 02/03/1946 73 y.o.  Admit date: 12/10/2019 Discharge date: 12/12/2019  Admitting Diagnosis: s/p robotically assisted ultra-low anterior resection with coloanal stapled anastomosis and diverting loop ileostomy 07/12/2019  Elective surgery for ileostomy reversal   Discharge Diagnosis Patient Active Problem List   Diagnosis Date Noted  . Rectal cancer ([place]) 12/10/2019  . History of closure of ileostomy 12/10/2019 12/10/2019  . Hypokalemia 07/13/2019  . Hypomagnesemia 07/13/2019  . Anxiety state 07/12/2019  . Abdominal pain, epigastric   . 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 Hyperglycemia 10/17/2013  . Vitamin D deficiency 10/17/2013  . Acquired hypothyroidism 12/29/2011  . Essential hypertension 12/29/2011  . Mixed hyperlipidemia 12/29/2011    Consultants none  Imaging: No results found.  Procedures Dr. Johney Maine (12/10/19) - takedown of loop ileostomy with re-anastomosis   HPI: The patient is s/p robotically assisted ultra-low anterior resection with coloanal stapled anastomosis and diverting loop ileostomy 07/12/2019   Pathology c/w ypT3ypN1a (1/22 LN) well differentiated rectal adenoCA   [name] presented for surgery, with the diagnosis of [place].  Hospital Course:  Patient underwent procedure listed above. Tolerated procedure well and was admitted to the floor.  Diet was advanced as tolerated.  On POD#2, the patient was voiding well, having bowel function, tolerating diet, ambulating well, pain well controlled, vital signs stable, incisions c/d/i and felt stable for discharge home.  Patient will follow up as outlined below and knows to call with questions or concerns.     Patient was discharged in good condition.  Physical  Exam: General:  Alert, NAD, pleasant, cooperative Cardio: RRR, S1 & S2 normal, no murmur, rubs, gallops Resp: Effort normal, lungs CTA bilaterally, no wheezes, rales, rhonchi Abd:  Soft, ND, normal bowel sounds, incision see photo belowI, very minimal TTP without guarding, no peritonitis  Skin: warm and dry      Allergies as of 12/12/2019      Reactions   Metoprolol Swelling   Swelling of the lips (Toprol XL)   Statins    Myalgia   Demerol [meperidine Hcl] Nausea And Vomiting      Medication List    STOP taking these medications   diphenoxylate-atropine 2.5-0.025 MG tablet Commonly known as: LOMOTIL     TAKE these medications   amLODipine 5 MG tablet Commonly known as: NORVASC Take 5 mg by mouth every morning.   Calcium+D3 600-800 MG-UNIT Tabs Generic drug: Calcium Carb-Cholecalciferol Take 1 tablet by mouth daily.   ferrous sulfate 325 (65 FE) MG tablet Take 1 tablet (325 mg total) by mouth 2 (two) times daily with a meal. What changed: when to take this   levothyroxine 125 MCG tablet Commonly known as: SYNTHROID Take 125 mcg by mouth daily before breakfast.   loperamide 2 MG capsule Commonly known as: IMODIUM Take 2 capsules (4 mg total) by mouth every 8 (eight) hours as needed for diarrhea or loose stools (Use if >1072mL in ileostomy every 8 hours).   Potassium 99 MG Tabs Take 99 mg by mouth daily.   traMADol 50 MG tablet Commonly known as: ULTRAM Take 1-2 tablets (50-100 mg total) by mouth every 6 (six) hours as needed for moderate pain or severe pain (mild pain).   TURMERIC PO Take 35 mg by mouth daily.   Vitamin B-12 5000 MCG Subl Place 5,000 mcg under the  tongue daily.   vitamin C 1000 MG tablet Take 1,000 mg by mouth daily.   Vitamin D 125 MCG (5000 UT) Caps Take 5,000 Units by mouth daily. Vitamin D3   zinc gluconate 50 MG tablet Take 50 mg by mouth daily.            Discharge Care Instructions  (From admission, onward)          Start     Ordered   12/11/19 0000  Discharge wound care:    Comments: It is good for closed incisions and even open wounds to be washed every day.  Shower every day.  Short baths are fine.  Wash the incisions and wounds clean with soap & water.     REMOVE YOUR DRESSING AND SHOELACE WICKS OFF OF YOUR INCISION BY 12/31  You may leave closed incisions open to air if it is dry.   You may cover the incision with clean gauze & replace it after your daily shower for comfort.   12/11/19 1046           Follow-up Information    Michael Boston, MD. Schedule an appointment as soon as possible for a visit in 3 weeks.   Specialty: General Surgery Why: To follow up after your operation, To follow up after your hospital stay Contact information: [place] [address] [phone]           Signed: [facility] 12/12/2019, 10:08 AM Please see amion for pager for the following: Cristine Polio, & Friday 7:00am - 4:30pm Thursdays 7:00am -11:30am

## 2020-01-07 ENCOUNTER — Inpatient Hospital Stay: Payer: PPO | Attending: Nurse Practitioner | Admitting: Oncology

## 2020-01-07 ENCOUNTER — Telehealth: Payer: Self-pay

## 2020-01-07 ENCOUNTER — Inpatient Hospital Stay: Payer: PPO

## 2020-01-07 ENCOUNTER — Other Ambulatory Visit: Payer: Self-pay

## 2020-01-07 VITALS — BP 164/66 | HR 98 | Temp 98.5°F | Resp 18 | Ht 67.0 in | Wt 125.7 lb

## 2020-01-07 DIAGNOSIS — C20 Malignant neoplasm of rectum: Secondary | ICD-10-CM | POA: Diagnosis not present

## 2020-01-07 DIAGNOSIS — Z85048 Personal history of other malignant neoplasm of rectum, rectosigmoid junction, and anus: Secondary | ICD-10-CM | POA: Diagnosis not present

## 2020-01-07 LAB — CEA (IN HOUSE-CHCC): CEA (CHCC-In House): <1 ng/mL (ref 0.00–5.00)

## 2020-01-07 NOTE — Telephone Encounter (Signed)
Left voicemail for patient to call back [facility] 

## 2020-01-07 NOTE — Telephone Encounter (Signed)
Left message for pt to call back [facility]

## 2020-01-07 NOTE — Telephone Encounter (Addendum)
TC to pt per Ned Card NP to let her know that her CEA is normal, and to follow-up as scheduled. Pt verbalized understanding. No further problems or concerns at this time.  Patient returned call and was informed of her normal CEA result. Called [facility] and confirmed they have the referral. Will reach out to patient today.

## 2020-01-07 NOTE — Progress Notes (Signed)
[facility] OFFICE VISIT PROGRESS NOTE  I connected with Felice Lippitt on 01/07/20 at 11:00 AM EST by video and verified that I am speaking with the correct person using two identifiers.   I discussed the limitations, risks, security and privacy concerns of performing an evaluation and management service by telemedicine and the availability of in-person appointments. I also discussed with the patient that there may be a patient responsible charge related to this service. The patient expressed understanding and agreed to proceed.   Patient's location: Office Provider's location: Home   Diagnosis: Rectal cancer  INTERVAL HISTORY:   Ms. Kelm underwent ileostomy reversal on 12/10/2019.  She reports a hematoma at the surgical incision has improved.  She has irregular bowel habits and intermittent fecal incontinence.  She has developed irritation of the perineum.  Imodium helps with stool frequency.  Good appetite.  She avoids certain foods as this may worsen the stool frequency.  Objective:  Vital signs in last 24 hours:  Blood pressure (!) 164/66, pulse 98, temperature 98.5 F (36.9 C), temperature source Temporal, resp. rate 18, height 5\' 7"  (1.702 m), weight 125 lb 11.2 oz (57 kg), SpO2 100 %.     Lab Results:  Lab Results  Component Value Date   WBC 7.3 12/11/2019   HGB 8.7 (L) 12/11/2019   HCT 25.6 (L) 12/11/2019   MCV 98.5 12/11/2019   PLT 291 12/11/2019   NEUTROABS 4.0 11/20/2019    Medications: I have reviewed the patient's current medications.  Assessment/Plan: 1. Rectal cancer ? Nonobstructing mass in the mid rectum on colonoscopy 02/28/2019, 7 cm from the anal verge, biopsy confirmed adenocarcinoma-at least intramucosal ? CTs 03/05/2019-eccentric soft tissue thickening at the low rectum, multiple tiny lung nodules-nonspecific, 1 cm left adrenal nodule, no definite evidence of metastatic disease ? MR pelvis  03/08/2019-tumor at 6.5 cm from the internal anal sphincter, T3b,N1(single 7 mm right perirectal lymph node) ? Radiation/Xeloda 03/19/2019-04/27/2019, Xeloda completed 04/25/2019 ? CT abdomen/pelvis 06/22/2019-dilated ileum with diffuse wall thickening and mucosal enhancement, no rectal tumor seen, 0.9 cm right lower quadrant ileocolic node, no pelvic or inguinal adenopathy ? Low anterior resection, ileocecectomy, and diverting loop ileostomy 07/12/2019,ypT3ypN1awell-differentiated adenocarcinoma of the rectum, 1/22 lymph nodes positive, one tumor deposit, negative margins ? Cycle 1 Xeloda 08/20/2019 ? Cycle 2 Xeloda 09/10/2019 ? Cycle 3 Xeloda 09/29/2019 ? Cycle 4 Xeloda 10/18/2019, placed on hold beginning 10/22/2019 due to hand-foot syndrome, Xeloda resumed 10/23/2019 at a reduced dose of 1000 mg twice daily for the remainder of the cycle, discontinued 1116 secondary to burning of the hands and feet ? Cycle 5 Xeloda11/24/20-dose reduced to 500 mg twice daily (discontinued after 7 days due to hand-foot syndrome) ? Ileostomy reversal 12/10/2019 2. Hypertension 3. Anxiety   Disposition: Ms. Preza is in remission from rectal cancer.  We will follow up on the CEA from today.  She will return for an office visit and restaging CTs in May.  She will continue follow-up with Dr. Johney Maine for management of stool frequency.  We will make referral to the pelvic physical therapy clinic.   I discussed the assessment and treatment plan with the patient. The patient was provided an opportunity to ask questions and all were answered. The patient agreed with the plan and demonstrated an understanding of the instructions.   The patient was advised to call back or seek an in-person evaluation if the symptoms worsen or if the condition fails to improve as anticipated.  Betsy Coder ANP/GNP-BC   01/07/2020 11:16 AM

## 2020-01-11 ENCOUNTER — Ambulatory Visit: Payer: PPO | Attending: Oncology | Admitting: Physical Therapy

## 2020-01-11 ENCOUNTER — Other Ambulatory Visit: Payer: Self-pay

## 2020-01-11 DIAGNOSIS — M25652 Stiffness of left hip, not elsewhere classified: Secondary | ICD-10-CM

## 2020-01-11 DIAGNOSIS — M6281 Muscle weakness (generalized): Secondary | ICD-10-CM | POA: Diagnosis not present

## 2020-01-11 DIAGNOSIS — R279 Unspecified lack of coordination: Secondary | ICD-10-CM

## 2020-01-11 DIAGNOSIS — M25651 Stiffness of right hip, not elsewhere classified: Secondary | ICD-10-CM | POA: Diagnosis not present

## 2020-01-11 NOTE — Patient Instructions (Signed)
Access Code: [email or web address]  URL: [email or web address]  Date: 01/11/2020  Prepared by: Jari Favre   Exercises Supine Pelvic Floor Contraction - 10 reps - 1 sets - 2 sec hold - 3x daily - 7x weekly Sidelying Clamshell with Pelvic Floor Contraction - 10 reps - 3 sets - 1x daily - 7x weekly Supine Diaphragmatic Breathing with Pelvic Floor Lengthening - 10 reps - 1 sets - 3x daily - 7x weekly Seated Figure 4 Piriformis Stretch - 3 reps - 1 sets - 30 sec hold - 1x daily - 7x weekly Seated Hamstring Stretch - 3 reps - 1 sets - 30 sec hold - 1x daily - 7x weekly
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
## 2020-01-15 ENCOUNTER — Other Ambulatory Visit: Payer: Self-pay

## 2020-01-15 ENCOUNTER — Ambulatory Visit: Payer: PPO | Attending: Oncology | Admitting: Physical Therapy

## 2020-01-15 ENCOUNTER — Encounter: Payer: Self-pay | Admitting: Physical Therapy

## 2020-01-15 DIAGNOSIS — R279 Unspecified lack of coordination: Secondary | ICD-10-CM | POA: Diagnosis not present

## 2020-01-15 DIAGNOSIS — M25652 Stiffness of left hip, not elsewhere classified: Secondary | ICD-10-CM | POA: Insufficient documentation

## 2020-01-15 DIAGNOSIS — M25651 Stiffness of right hip, not elsewhere classified: Secondary | ICD-10-CM | POA: Diagnosis not present

## 2020-01-15 DIAGNOSIS — M6281 Muscle weakness (generalized): Secondary | ICD-10-CM | POA: Diagnosis not present
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
## 2020-01-25 ENCOUNTER — Encounter: Payer: Self-pay | Admitting: Physical Therapy

## 2020-01-25 ENCOUNTER — Ambulatory Visit: Payer: PPO | Admitting: Physical Therapy

## 2020-01-25 ENCOUNTER — Other Ambulatory Visit: Payer: Self-pay

## 2020-01-25 DIAGNOSIS — M25651 Stiffness of right hip, not elsewhere classified: Secondary | ICD-10-CM

## 2020-01-25 DIAGNOSIS — M6281 Muscle weakness (generalized): Secondary | ICD-10-CM | POA: Diagnosis not present

## 2020-01-25 DIAGNOSIS — R279 Unspecified lack of coordination: Secondary | ICD-10-CM

## 2020-01-25 DIAGNOSIS — M25652 Stiffness of left hip, not elsewhere classified: Secondary | ICD-10-CM
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
## 2020-01-25 NOTE — Patient Instructions (Signed)
Access Code: [email or web address]

## 2020-01-31 ENCOUNTER — Ambulatory Visit: Payer: PPO

## 2020-02-01 ENCOUNTER — Ambulatory Visit: Payer: PPO | Admitting: Physical Therapy

## 2020-02-01 ENCOUNTER — Encounter: Payer: Self-pay | Admitting: Physical Therapy

## 2020-02-01 ENCOUNTER — Other Ambulatory Visit: Payer: Self-pay

## 2020-02-01 DIAGNOSIS — M25652 Stiffness of left hip, not elsewhere classified: Secondary | ICD-10-CM

## 2020-02-01 DIAGNOSIS — M6281 Muscle weakness (generalized): Secondary | ICD-10-CM

## 2020-02-01 DIAGNOSIS — R279 Unspecified lack of coordination: Secondary | ICD-10-CM

## 2020-02-01 DIAGNOSIS — M25651 Stiffness of right hip, not elsewhere classified: Secondary | ICD-10-CM

## 2020-02-01 NOTE — Patient Instructions (Signed)
Access Code: [email or web address]  URL: [email or web address]  Date: 02/01/2020  Prepared by: Jari Favre   Exercises Sidelying Clamshell with Pelvic Floor Contraction - 10 reps - 3 sets - 3 hold - 1x daily - 7x weekly Supine Diaphragmatic Breathing with Pelvic Floor Lengthening - 10 reps - 1 sets - 3x daily - 7x weekly Seated Figure 4 Piriformis Stretch - 3 reps - 1 sets - 30 sec hold - 1x daily - 7x weekly Supine Hip Internal and External Rotation - 10 reps - 1 sets - 5 sec hold - 1x daily - 7x weekly Seated Hamstring Stretch - 3 reps - 1 sets - 30 sec hold - 1x daily - 7x weekly Sit to Stand with Pelvic Floor Contraction - 10 reps - 2 sets - 1x daily - 7x weekly Seated Pelvic Floor Contraction with Hip Abduction and Resistance Loop - 10 reps - 1 sets - 3x daily - 7x weekly Thoracic Extension Mobilization with Noodle - 10 reps - 3 sets - 1x daily - 7x weekly Sidelying Thoracic Rotation with Open Book - 5 reps - 1 sets - 10 sec hold - 1x daily - 7x weekly Single Arm Doorway Pec Stretch at 90 Degrees Abduction - 3 reps - 1 sets - 30 sec hold - 1x daily - 7x weekly Supine Bridge with Mini Swiss Ball Between Knees - 10 reps - 2 sets - 1x daily - 7x weekly
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
## 2020-02-07 ENCOUNTER — Telehealth: Payer: Self-pay

## 2020-02-07 NOTE — Telephone Encounter (Signed)
Recall date changed to August 2021. Patient is aware.

## 2020-02-07 NOTE — Telephone Encounter (Signed)
Yes, that would be perfectly fine.  Recall colonoscopy August 2021.  Thank you

## 2020-02-07 NOTE — Telephone Encounter (Signed)
Dr. Ardis Hughs, I called this patient to schedule for a colonoscopy. She stated that per her surgeon he would like  her to wait a year from the time of her surgery to have a repeat colonoscopy. That would be July 30th. Is it ok to change the recall date for July or August. Please advise.

## 2020-02-08 ENCOUNTER — Ambulatory Visit: Payer: PPO | Admitting: Physical Therapy

## 2020-02-08 ENCOUNTER — Other Ambulatory Visit: Payer: Self-pay

## 2020-02-08 DIAGNOSIS — M6281 Muscle weakness (generalized): Secondary | ICD-10-CM | POA: Diagnosis not present

## 2020-02-08 DIAGNOSIS — M25652 Stiffness of left hip, not elsewhere classified: Secondary | ICD-10-CM

## 2020-02-08 DIAGNOSIS — M25651 Stiffness of right hip, not elsewhere classified: Secondary | ICD-10-CM

## 2020-02-08 DIAGNOSIS — R279 Unspecified lack of coordination: Secondary | ICD-10-CM
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
## 2020-02-08 NOTE — Patient Instructions (Addendum)
Toileting Techniques for Bowel Movements (Defecation) Using your belly (abdomen) and pelvic floor muscles to have a bowel movement is usually instinctive.  Sometimes people can have problems with these muscles and have to relearn proper defecation (emptying) techniques.  If you have weakness in your muscles, organs that are falling out, decreased sensation in your pelvis, or ignore your urge to go, you may find yourself straining to have a bowel movement.  You are straining if you are: . holding your breath or taking in a huge gulp of air and holding it  . keeping your lips and jaw tensed and closed tightly . turning red in the face because of excessive pushing or forcing . developing or worsening your  hemorrhoids . getting faint while pushing . not emptying completely and have to defecate many times a day  If you are straining, you are actually making it harder for yourself to have a bowel movement.  Many people find they are pulling up with the pelvic floor muscles and closing off instead of opening the anus. Due to lack pelvic floor relaxation and coordination the abdominal muscles, one has to work harder to push the feces out.  Many people have never been taught how to defecate efficiently and effectively.  Notice what happens to your body when you are having a bowel movement.  While you are sitting on the toilet pay attention to the following areas: . Jaw and mouth position . Angle of your hips   . Whether your feet touch the ground or not . Arm placement  . Spine position . Waist . Belly tension . Anus (opening of the anal canal)  An Evacuation/Defecation Plan   Here are the 4 basic points:  1. Lean forward enough for your elbows to rest on your knees 2. Support your feet on the floor or use a low stool if your feet don't touch the floor  3. Push out your belly as if you have swallowed a beach ball-you should feel a widening of your waist 4. Open and relax your pelvic floor muscles,  rather than tightening around the anus      The following conditions my require modifications to your toileting posture:  . If you have had surgery in the past that limits your back, hip, pelvic, knee or ankle flexibility . Constipation   Your healthcare practitioner may make the following additional suggestions and adjustments:  1) Sit on the toilet  a) Make sure your feet are supported. b) Notice your hip angle and spine position-most people find it effective to lean forward or raise their knees, which can help the muscles around the anus to relax  c) When you lean forward, place your forearms on your thighs for support  2) Relax suggestions a) Breath deeply in through your nose and out slowly through your mouth as if you are smelling the flowers and blowing out the candles. b) To become aware of how to relax your muscles, contracting and releasing muscles can be helpful.  Pull your pelvic floor muscles in tightly by using the image of holding back gas, or closing around the anus (visualize making a circle smaller) and lifting the anus up and in.  Then release the muscles and your anus should drop down and feel open. Repeat 5 times ending with the feeling of relaxation. c) Keep your pelvic floor muscles relaxed; let your belly bulge out. d) The digestive tract starts at the mouth and ends at the anal opening, so be   sure to relax both ends of the tube.  Place your tongue on the roof of your mouth with your teeth separated.  This helps relax your mouth and will help to relax the anus at the same time.  3) Empty (defecation) a) Keep your pelvic floor and sphincter relaxed, then bulge your anal muscles.  Make the anal opening wide.  b) Stick your belly out as if you have swallowed a beach ball. c) Make your belly wall hard using your belly muscles while continuing to breathe. Doing this makes it easier to open your anus. d) Breath out and give a grunt (or try using other sounds  such as ahhhh, shhhhh, ohhhh or grrrrrrr).  4) Finish a) As you finish your bowel movement, pull the pelvic floor muscles up and in.  This will leave your anus in the proper place rather than remaining pushed out and down. If you leave your anus pushed out and down, it will start to feel as though that is normal and give you incorrect signals about needing to have a bowel movement.  [facility] [place], [place] [address] Access Code: [email or web address]  URL: [email or web address]  Date: 02/08/2020  Prepared by: Jari Favre   Exercises Sidelying Clamshell with Pelvic Floor Contraction - 10 reps - 3 sets - 3 hold - 1x daily - 7x weekly Supine Bridge with Mini Swiss Ball Between Knees - 10 reps - 2 sets - 1x daily - 7x weekly Seated Pelvic Floor Contraction with Hip Abduction and Resistance Loop - 10 reps - 1 sets - 3x daily - 7x weekly Sit to Stand with Pelvic Floor Contraction - 10 reps - 2 sets - 1x daily - 7x weekly Supine Diaphragmatic Breathing with Pelvic Floor Lengthening - 10 reps - 1 sets - 3x daily - 7x weekly Open Book Chest Stretch on Towel Roll - 10 reps - 3 sets - 1x daily - 7x weekly Thoracic Extension Mobilization with Noodle - 10 reps - 3 sets - 1x daily - 7x weekly Supine Hip Internal and External Rotation - 10 reps - 1 sets - 5 sec hold - 1x daily - 7x weekly Supine Pelvic Floor Stretch - 3 reps - 1 sets - 30 sec hold - 1x daily - 7x weekly Supine Hamstring Stretch with Strap - 3 reps - 1 sets - 30 sec hold - 1x daily - 7x weekly Seated Figure 4 Piriformis Stretch - 3 reps - 1 sets - 30 sec hold - 1x daily - 7x weekly Seated Hamstring Stretch - 3 reps - 1 sets - 30 sec hold - 1x daily - 7x weekly Sidelying Thoracic Rotation with Open Book - 5 reps - 1 sets - 10 sec hold - 1x daily - 7x weekly Single Arm Doorway Pec Stretch at 90 Degrees Abduction - 3 reps - 1 sets - 30 sec hold - 1x daily - 7x weekly

## 2020-02-15 ENCOUNTER — Ambulatory Visit: Payer: PPO | Attending: Oncology | Admitting: Physical Therapy

## 2020-02-15 ENCOUNTER — Other Ambulatory Visit: Payer: Self-pay

## 2020-02-15 ENCOUNTER — Encounter: Payer: Self-pay | Admitting: Physical Therapy

## 2020-02-15 DIAGNOSIS — M25651 Stiffness of right hip, not elsewhere classified: Secondary | ICD-10-CM

## 2020-02-15 DIAGNOSIS — M6281 Muscle weakness (generalized): Secondary | ICD-10-CM | POA: Diagnosis not present

## 2020-02-15 DIAGNOSIS — R279 Unspecified lack of coordination: Secondary | ICD-10-CM | POA: Diagnosis not present

## 2020-02-15 DIAGNOSIS — M25652 Stiffness of left hip, not elsewhere classified: Secondary | ICD-10-CM
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
## 2020-02-22 ENCOUNTER — Ambulatory Visit: Payer: PPO | Admitting: Physical Therapy

## 2020-02-29 ENCOUNTER — Other Ambulatory Visit: Payer: Self-pay

## 2020-02-29 ENCOUNTER — Ambulatory Visit
Admission: RE | Admit: 2020-02-29 | Discharge: 2020-02-29 | Disposition: A | Discharge status: Home / Self Care | Payer: PPO | Source: Ambulatory Visit | Attending: Nurse Practitioner | Admitting: Nurse Practitioner

## 2020-02-29 DIAGNOSIS — Z1231 Encounter for screening mammogram for malignant neoplasm of breast: Secondary | ICD-10-CM | POA: Diagnosis not present

## 2020-03-04 ENCOUNTER — Telehealth: Payer: Self-pay

## 2020-03-04 DIAGNOSIS — R197 Diarrhea, unspecified: Secondary | ICD-10-CM | POA: Diagnosis not present

## 2020-03-04 NOTE — Telephone Encounter (Signed)
-----   Message from Milus Banister, MD sent at 03/03/2020 12:10 PM EDT ----- Regarding: RE: Pt with frequent stools s/p ileostomy TD w h/o low LAR coloanal. Richardson Landry, Sorry to hear that she is doing poorly.  We will reach out to her to get her back in the office talk over her symptoms, discuss other options.  Thanks  Patty, She needs my next available return office visit.  Thanks ----- Message ----- From: Michael Boston, MD Sent: 03/03/2020   9:17 AM EDT To: Milus Banister, MD, Illene Regulus, # Subject: RE: Pt with frequent stools s/p ileostomy TD#  This woman is 3 months from ileostomy takedown.  She required a low anterior resection with low coloanal anastomosis.    Consider switching a different fiber bowel regimen - low dose.   Agree with ruling out C. Difficile.  I suspect she will need a year or 2 for her neo-rectum colon to stretch out & accomodate better.    I had discussed the more aggressive antidiarrheal regimen back in Jan2021 when we last heard from her, tryinh 4mg  TID (from her 4mg  BID)..  She can do 16 mg of Imodium total a day (4mg  QID).  Can add Lomotil as well.  Can try paregoric if that is not slowing things down  Consider pelvic floor PT as well to help   One of Dr. Edison Nasuti has any other insights for this woman?  I can't tell for certain that she had irritable bowel preop.  ----- Message ----- From: Illene Regulus Sent: 03/03/2020   8:56 AM EDT To: Michael Boston, MD Subject: FW: Pt with frequent stools                    Spoke to Dr Johney Maine again about this pt to see if there was anything else we could offer her. Per Dr Johney Maine he would like to order a c-diff on this pt and then if negative the pt should f/u w/GI again Dr Ardis Hughs. We also could offer the pt a referral to the pelvic floor PT once we get the results back from the c-diff. ----- Message ----- From: Tawni Pummel Sent: 02/29/2020   9:24 AM EDT To: Michael Boston, MD, Illene Regulus Subject: Pt with  frequent stools                        Pt is s/p ileostomy takedown 12/10/2019.  She had previous surgery and treatment for rectal cancer. She has had stools that have ranged from diarrhea to many small soft stools a day.  At this time she has "clustering of stools", several small soft stools but does not feel like her bowel is completely evacuating.  Now her bottom is so sore that it bleeds at times and it is very painful to clean after having bowel movements.    She has tried many many OTC remedies including Vasoline, special compound formulas, Calmoseptine, diaper ointments.  She was informed that many of our patients have success with Desitin.  She has not been able to tolerate any type of fiber supplements including Citrucel, Benefiber, Metamucil.   Does the surgeon have any recommendations to help with controlling the frequent stools? (She stated she is feeling stronger and walks almost a mile every day.)  SL

## 2020-03-04 NOTE — Telephone Encounter (Signed)
Called and spoke with patient-patient has been scheduled with Paula,NP on 03/06/2020 at 1:30 pm at the [facility] office as Dr. Ardis Hughs does not have availabilities at this time-patient has requested to be seen sooner and has agreed to be seen by an APP; Patient advised to call back to the office at [phone] should questions/concerns arise;  Patient verbalized understanding of information/instructions;

## 2020-03-06 ENCOUNTER — Encounter: Payer: Self-pay | Admitting: Nurse Practitioner

## 2020-03-06 ENCOUNTER — Ambulatory Visit: Payer: PPO | Admitting: Nurse Practitioner

## 2020-03-06 VITALS — BP 160/60 | HR 84 | Temp 98.1°F | Ht 67.0 in | Wt 124.0 lb

## 2020-03-06 DIAGNOSIS — R197 Diarrhea, unspecified: Secondary | ICD-10-CM | POA: Diagnosis not present

## 2020-03-06 MED ORDER — CHOLESTYRAMINE 4 G PO PACK: 4.0000 g | PACK | Freq: Two times a day (BID) | ORAL | 2 refills | Status: DC

## 2020-03-06 NOTE — Patient Instructions (Addendum)
If you are age 73 or older, your body mass index should be between 23-30. Your Body mass index is 19.42 kg/m. If this is out of the aforementioned range listed, please consider follow up with your Primary Care Provider.  If you are age 9 or younger, your body mass index should be between 19-25. Your Body mass index is 19.42 kg/m. If this is out of the aformentioned range listed, please consider follow up with your Primary Care Provider.   We have sent the following medications to your pharmacy for you to pick up at your convenience: Questran  Use Recticare as needed for anal irritation/discomfort.  This is over-the-counter.  Follow up as needed.  Thank you for choosing me and [facility].   Tye Savoy, NP
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
## 2020-03-07 ENCOUNTER — Telehealth: Payer: Self-pay | Admitting: Nurse Practitioner

## 2020-03-07 NOTE — Telephone Encounter (Signed)
Spoke with patient about separating cholestyramine from other meds by a couple of hours ( especially synthroid). She mentions that her diarrhea has already improved after just two doses of Questran.

## 2020-03-07 NOTE — Progress Notes (Signed)
I agree with the above note, plan 

## 2020-03-10 NOTE — Telephone Encounter (Signed)
Patient called states she had diarrhea all weekend. She said she spoke with Dr. Henrene Pastor and wanted to advise you as well.

## 2020-03-21 ENCOUNTER — Telehealth: Payer: Self-pay | Admitting: Nurse Practitioner

## 2020-03-21 NOTE — Telephone Encounter (Signed)
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  I want her to continue the Questran twice daily.  I want her to stop the Lomotil and instead I want her to take 2 Imodium twice daily on a scheduled basis.  No fiber supplements, they tend to make LAR syndrome worse.  She needs return office visit with me or Nevin Bloodgood in 2 to 3 weeks.  She might need flexible sigmoidoscopy to exclude other pathology, perhaps stricturing of her LAR anastomosis.

## 2020-03-21 NOTE — Telephone Encounter (Signed)
The pt has been advised and verbalized understanding of Dr Ardis Hughs recommendations.  She has an appt with Nevin Bloodgood on 4/22.  She will call back with any further concerns

## 2020-03-21 NOTE — Telephone Encounter (Signed)
Kathleen Flores, she is Dr Eugenia Pancoast patient. I am forwarding it to him. Thanks

## 2020-03-21 NOTE — Telephone Encounter (Signed)
Doc Of the Day  Lengthy phone call with the patient. She is continuing to have stooling urgency, but her diarrhea is gone. She is using Questran twice daily. She took Lomotil twice yesterday. None today. She did not have an improvement of her symptom. Her "bottom burns" and it feels like the stool burns once it enters her rectal vault.This is what awakens her when she is sleeping. Her stools are "little finger sized" and "I know I was up 20 times last night."

## 2020-03-23 ENCOUNTER — Encounter (HOSPITAL_COMMUNITY): Payer: Self-pay

## 2020-03-23 ENCOUNTER — Emergency Department (HOSPITAL_COMMUNITY)
Admission: EM | Admit: 2020-03-23 | Discharge: 2020-03-23 | Disposition: A | Discharge status: Home / Self Care | Payer: PPO | Attending: Emergency Medicine | Admitting: Emergency Medicine

## 2020-03-23 DIAGNOSIS — Z79899 Other long term (current) drug therapy: Secondary | ICD-10-CM | POA: Diagnosis not present

## 2020-03-23 DIAGNOSIS — Z87891 Personal history of nicotine dependence: Secondary | ICD-10-CM | POA: Diagnosis not present

## 2020-03-23 DIAGNOSIS — I10 Essential (primary) hypertension: Secondary | ICD-10-CM | POA: Insufficient documentation

## 2020-03-23 DIAGNOSIS — R55 Syncope and collapse: Secondary | ICD-10-CM | POA: Insufficient documentation

## 2020-03-23 DIAGNOSIS — E039 Hypothyroidism, unspecified: Secondary | ICD-10-CM | POA: Insufficient documentation

## 2020-03-23 LAB — CBG MONITORING, ED: Glucose-Capillary: 88 mg/dL (ref 70–99)

## 2020-03-23 LAB — CBC
HCT: 33.4 % — ABNORMAL LOW (ref 36.0–46.0)
Hemoglobin: 10.5 g/dL — ABNORMAL LOW (ref 12.0–15.0)
MCH: 28.5 pg (ref 26.0–34.0)
MCHC: 31.4 g/dL (ref 30.0–36.0)
MCV: 90.5 fL (ref 80.0–100.0)
Platelets: 232 10*3/uL (ref 150–400)
RBC: 3.69 MIL/uL — ABNORMAL LOW (ref 3.87–5.11)
RDW: 13.8 % (ref 11.5–15.5)
WBC: 7.5 10*3/uL (ref 4.0–10.5)
nRBC: 0 % (ref 0.0–0.2)

## 2020-03-23 LAB — BASIC METABOLIC PANEL
Anion gap: 8 (ref 5–15)
BUN: 9 mg/dL (ref 8–23)
CO2: 28 mmol/L (ref 22–32)
Calcium: 9.3 mg/dL (ref 8.9–10.3)
Chloride: 102 mmol/L (ref 98–111)
Creatinine, Ser: 0.5 mg/dL (ref 0.44–1.00)
GFR calc Af Amer: >60 mL/min (ref >60)
GFR calc non Af Amer: >60 mL/min (ref >60)
Glucose, Bld: 101 mg/dL — ABNORMAL HIGH (ref 70–99)
Potassium: 3.7 mmol/L (ref 3.5–5.1)
Sodium: 138 mmol/L (ref 135–145)

## 2020-03-23 LAB — URINALYSIS, ROUTINE W REFLEX MICROSCOPIC
Bacteria, UA: NONE SEEN
Bilirubin Urine: NEGATIVE
Glucose, UA: NEGATIVE mg/dL
Hgb urine dipstick: NEGATIVE
Ketones, ur: NEGATIVE mg/dL
Nitrite: NEGATIVE
Protein, ur: NEGATIVE mg/dL
Specific Gravity, Urine: 1.002 — ABNORMAL LOW (ref 1.005–1.030)
pH: 7 (ref 5.0–8.0)

## 2020-03-23 MED ORDER — LACTATED RINGERS IV BOLUS
1000.0000 mL | Freq: Once | INTRAVENOUS | Status: AC
  Administered 2020-03-23: 1000 mL via INTRAVENOUS

## 2020-03-23 MED ORDER — SODIUM CHLORIDE 0.9% FLUSH
3.0000 mL | Freq: Once | INTRAVENOUS | Status: AC
  Administered 2020-03-23: 3 mL via INTRAVENOUS

## 2020-03-23 NOTE — ED Triage Notes (Signed)
Pt presents with c/o 2 syncopal episodes that happened overnight. Pt reports she had a reversal of an ileostomy in December but reports that since December she has been passing copious amounts of stool. Pt reports she did lose consciousness when she passed out last night but denies hitting her head.
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
## 2020-03-23 NOTE — Discharge Instructions (Signed)
Please drink plenty of fluids Make a follow up appointment with GI Return if you are worsening

## 2020-03-24 ENCOUNTER — Telehealth: Payer: Self-pay | Admitting: Nurse Practitioner

## 2020-03-24 NOTE — Telephone Encounter (Signed)
Patient states she is better today and she will keep her appointment on 04/03/20

## 2020-04-03 ENCOUNTER — Ambulatory Visit (INDEPENDENT_AMBULATORY_CARE_PROVIDER_SITE_OTHER): Payer: PPO | Admitting: Nurse Practitioner

## 2020-04-03 ENCOUNTER — Encounter: Payer: Self-pay | Admitting: Nurse Practitioner

## 2020-04-03 ENCOUNTER — Telehealth: Payer: Self-pay | Admitting: Gastroenterology

## 2020-04-03 VITALS — BP 136/50 | HR 96 | Temp 98.2°F | Ht 66.0 in | Wt 122.1 lb

## 2020-04-03 DIAGNOSIS — R197 Diarrhea, unspecified: Secondary | ICD-10-CM

## 2020-04-03 DIAGNOSIS — Z01818 Encounter for other preprocedural examination: Secondary | ICD-10-CM
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
## 2020-04-03 NOTE — Telephone Encounter (Signed)
Patient wants to know why can't she have a colonoscopy instead of a flex sigmoid.  She is having a flex sig for evaluation of the diarrhea she has been having. She is s/p chemo/radiation and resection with a take down December 2020. She is inquiring so she will understand and this will be changed to colonoscopy if appropriate. Thanks

## 2020-04-03 NOTE — Telephone Encounter (Signed)
Patient has questions in reference to flex sig

## 2020-04-03 NOTE — Patient Instructions (Signed)
If you are age 73 or older, your body mass index should be between 23-30. Your Body mass index is 19.71 kg/m. If this is out of the aforementioned range listed, please consider follow up with your Primary Care Provider.  If you are age 82 or younger, your body mass index should be between 19-25. Your Body mass index is 19.71 kg/m. If this is out of the aformentioned range listed, please consider follow up with your Primary Care Provider.   You have been scheduled for a flexible sigmoidoscopy. Please follow the written instructions given to you at your visit today. If you use inhalers (even only as needed), please bring them with you on the day of your procedure.  DO NOT take your anti-diarrheal medications starting at midnight 04/15/20.  Follow up pending the results of your flex sig.

## 2020-04-04 ENCOUNTER — Other Ambulatory Visit: Payer: Self-pay

## 2020-04-04 MED ORDER — NA SULFATE-K SULFATE-MG SULF 17.5-3.13-1.6 GM/177ML PO SOLN: ORAL | 0 refills | Status: DC

## 2020-04-04 NOTE — Telephone Encounter (Signed)
See OV note addendum.  Thanks

## 2020-04-04 NOTE — Addendum Note (Signed)
Addended by: Virgina Evener A on: 04/04/2020 10:14 AM   Modules accepted: Orders

## 2020-04-04 NOTE — Telephone Encounter (Signed)
Patient has been notified. The appointment has been changed. She will pick up her new instructions and sign a new consent form.

## 2020-04-04 NOTE — Progress Notes (Signed)
I agree with the above note, plan.  I also reviewed a phone note from shortly after her OV in which she asked "why she cannot have a colonoscopy instead of a flex sig."  I think full colonoscopy is reasonable, will give a chance to examine full colon, she did have pretty bad XRT damage to the right colon (?) and small bowel.    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  Sometimes APR syndrome necessitates ostomy.  Thanks.

## 2020-04-14 ENCOUNTER — Telehealth: Payer: Self-pay | Admitting: Nurse Practitioner

## 2020-04-14 ENCOUNTER — Other Ambulatory Visit: Payer: Self-pay | Admitting: Gastroenterology

## 2020-04-14 ENCOUNTER — Ambulatory Visit (INDEPENDENT_AMBULATORY_CARE_PROVIDER_SITE_OTHER): Payer: PPO

## 2020-04-14 DIAGNOSIS — Z1159 Encounter for screening for other viral diseases: Secondary | ICD-10-CM

## 2020-04-14 LAB — SARS CORONAVIRUS 2 (TAT 6-24 HRS): SARS Coronavirus 2: NEGATIVE

## 2020-04-14 NOTE — Telephone Encounter (Signed)
Pt would like to speak with you about her procedure scheduled on Wednesday.

## 2020-04-14 NOTE — Telephone Encounter (Signed)
Reports large amounts of mucous in the stools. She also notes greenish color to the stools. Describes it as "like a sinus infection." She is having "pencil stools" or "diarrhea" and this keeps her up at night. She is physically and emotionally affected by this ordeal. Not confident in the control of her stools and  Wearing protective garments like depends in order to function. Afraid to leave the house. Not eating because she does not feel like. Really wants to get some answers. Expresses confidence in her primary GI, Dr Ardis Hughs.

## 2020-04-16 ENCOUNTER — Other Ambulatory Visit: Payer: Self-pay

## 2020-04-16 ENCOUNTER — Ambulatory Visit (AMBULATORY_SURGERY_CENTER): Payer: PPO | Admitting: Gastroenterology

## 2020-04-16 ENCOUNTER — Encounter: Payer: Self-pay | Admitting: Gastroenterology

## 2020-04-16 ENCOUNTER — Telehealth: Payer: Self-pay | Admitting: Nurse Practitioner

## 2020-04-16 VITALS — BP 143/71 | HR 88 | Temp 97.1°F | Resp 28 | Ht 66.0 in | Wt 122.0 lb

## 2020-04-16 DIAGNOSIS — K633 Ulcer of intestine: Secondary | ICD-10-CM | POA: Diagnosis not present

## 2020-04-16 DIAGNOSIS — R197 Diarrhea, unspecified: Secondary | ICD-10-CM

## 2020-04-16 MED ORDER — SODIUM CHLORIDE 0.9 % IV SOLN: 500.0000 mL | Freq: Once | INTRAVENOUS | Status: DC

## 2020-04-16 NOTE — Telephone Encounter (Signed)
I will ask Dr. Ardis Hughs since he did the colonoscopy

## 2020-04-16 NOTE — Patient Instructions (Signed)
Thank you for allowing [place] to care for you today!  Await biopsy results, approximately 7-10 days.  Will communicate results with Dr Johney Maine.  Resume previous diet and medications today.  Return to your normal activities tomorrow.   YOU HAD AN ENDOSCOPIC PROCEDURE TODAY AT [facility]:   Refer to the procedure report that was given to you for any specific questions about what was found during the examination.  If the procedure report does not answer your questions, please call your gastroenterologist to clarify.  If you requested that your care partner not be given the details of your procedure findings, then the procedure report has been included in a sealed envelope for you to review at your convenience later.  YOU SHOULD EXPECT: Some feelings of bloating in the abdomen. Passage of more gas than usual.  Walking can help get rid of the air that was put into your GI tract during the procedure and reduce the bloating. If you had a lower endoscopy (such as a colonoscopy or flexible sigmoidoscopy) you may notice spotting of blood in your stool or on the toilet paper. If you underwent a bowel prep for your procedure, you may not have a normal bowel movement for a few days.  Please Note:  You might notice some irritation and congestion in your nose or some drainage.  This is from the oxygen used during your procedure.  There is no need for concern and it should clear up in a day or so.  SYMPTOMS TO REPORT IMMEDIATELY:   Following lower endoscopy (colonoscopy or flexible sigmoidoscopy):  Excessive amounts of blood in the stool  Significant tenderness or worsening of abdominal pains  Swelling of the abdomen that is new, acute  Fever of 100F or higher   For urgent or emergent issues, a gastroenterologist can be reached at any hour by calling [phone]. Do not use MyChart messaging for urgent concerns.    DIET:  We do recommend a small meal at first, but then you may proceed  to your regular diet.  Drink plenty of fluids but you should avoid alcoholic beverages for 24 hours.  ACTIVITY:  You should plan to take it easy for the rest of today and you should NOT DRIVE or use heavy machinery until tomorrow (because of the sedation medicines used during the test).    FOLLOW UP: Our staff will call the number listed on your records 48-72 hours following your procedure to check on you and address any questions or concerns that you may have regarding the information given to you following your procedure. If we do not reach you, we will leave a message.  We will attempt to reach you two times.  During this call, we will ask if you have developed any symptoms of COVID 19. If you develop any symptoms (ie: fever, flu-like symptoms, shortness of breath, cough etc.) before then, please call [phone].  If you test positive for Covid 19 in the 2 weeks post procedure, please call and report this information to [place].    If any biopsies were taken you will be contacted by phone or by letter within the next 1-3 weeks.  Please call us at [phone] if you have not heard about the biopsies in 3 weeks.    SIGNATURES/CONFIDENTIALITY: You and/or your care partner have signed paperwork which will be entered into your electronic medical record.  These signatures attest to the fact that that the information above on your After Visit Summary  has been reviewed and is understood.  Full responsibility of the confidentiality of this discharge information lies with you and/or your care-partner. 

## 2020-04-16 NOTE — Progress Notes (Signed)
Pt's states no medical or surgical changes since previsit or office visit.  Temp- June Vitals- Courtney 

## 2020-04-16 NOTE — Progress Notes (Signed)
Called to room to assist during endoscopic procedure.  Patient ID and intended procedure confirmed with present staff. Received instructions for my participation in the procedure from the performing physician.  

## 2020-04-16 NOTE — Telephone Encounter (Signed)
Patient is asking if Nevin Bloodgood believes she could have a ulcer found on her colonoscopy be from the "bile acid diarrhea"?   She would like to have Nevin Bloodgood and Dr. Ardis Hughs discuss. She is asking if Nevin Bloodgood herself or Dr. Ardis Hughs call back to discuss

## 2020-04-16 NOTE — Telephone Encounter (Signed)
Patient called states she has an ulcer in her rectal area and she is not sure if it's from the acid diarrhea she had for 4 months or from radiation please advise

## 2020-04-16 NOTE — Op Note (Signed)
[place] Patient Name: Kathleen Flores Procedure Date: 04/16/2020 10:03 AM MRN: [id number] Endoscopist: Milus Banister , MD Age: 74 Referring MD:  Date of Birth: 1946-03-11 Gender: Female Account #: [id number] Procedure:                Colonoscopy Indications:              High risk colon cancer surveillance: Rectal                            adenocarcinoma diagnosed 02/2019; she underwent                            neoadjuvant chemo/XRT complicated by severe XRT                            damage to distal small bowel causing intermittent                            obstructions; s/p Low anterior resection,                            ileocecectomy, and diverting loop ileostomy Dr                            Johney Maine on 07/12/2019,[id number]-differentiated                            adenocarcinoma of the rectum, 1/22 lymph nodes                            positive, one tumor deposit, negative margins. She                            has done poorly since ileostomy reversal 11/2019 Medicines:                Monitored Anesthesia Care Procedure:                Pre-Anesthesia Assessment:                           - Prior to the procedure, a History and Physical                            was performed, and patient medications and                            allergies were reviewed. The patient's tolerance of                            previous anesthesia was also reviewed. The risks                            and benefits of the procedure and the sedation  options and risks were discussed with the patient.                            All questions were answered, and informed consent                            was obtained. Prior Anticoagulants: The patient has                            taken no previous anticoagulant or antiplatelet                            agents. ASA Grade Assessment: II - A patient with                            mild systemic disease.  After reviewing the risks                            and benefits, the patient was deemed in                            satisfactory condition to undergo the procedure.                           After obtaining informed consent, the colonoscope                            was passed under direct vision. Throughout the                            procedure, the patient's blood pressure, pulse, and                            oxygen saturations were monitored continuously. The                            Colonoscope was introduced through the anus and                            advanced to the the ileocolonic anastomosis. The                            colonoscopy was performed without difficulty. The                            patient tolerated the procedure well. The quality                            of the bowel preparation was good. The terminal                            ileum was photographed. Scope In: 10:17:07 AM Scope Out: 10:28:36 AM Scope Withdrawal Time: 0 hours 7 minutes 41 seconds  Total Procedure  Duration: 0 hours 11 minutes 29 seconds  Findings:                 Ileocolonic anastomosis in the right colon was                            normal.                           Colonic mucosa was normal throughout, random                            biopsies taken to check for microscopic colitis.                           Colorectal anastomosis was widely patent. The                            anastomosis was 1cm from the anal verge and                            appeared to be a side to end anastomosis with a                            very short blind colon end (see images). There was                            a 5-42mm deep ulcer at the colorectal anastomosis.                            This does not appear malignant. The ulcer and                            adjacent mucosa was extensively biopsied.                           The exam was otherwise without abnormality on                             direct and retroflexion views. Complications:            No immediate complications. Estimated blood loss:                            None. Estimated Blood Loss:     Estimated blood loss: none. Impression:               1. Ileocolonic anastomosis in the right colon was                            normal.                           2. Colonic mucosa was normal throughout, random  biopsies taken to check for microscopic colitis.                           3. Colorectal anastomosis was widely patent. The                            anastomosis was 1cm from the anal verge and                            appeared to be a side to end anastomosis with a                            very short blind colon end (see images). There was                            a 5-29mm deep ulcer at the colorectal anastomosis.                            This does not appear malignant. The ulcer and                            adjacent mucosa was extensively biopsied.                           4. The examination was otherwise normal. Recommendation:           - Patient has a contact number available for                            emergencies. The signs and symptoms of potential                            delayed complications were discussed with the                            patient. Return to normal activities tomorrow.                            Written discharge instructions were provided to the                            patient.                           - Resume previous diet.                           - Continue present medications.                           - Await pathology results.                           - Will communicate these results to Dr. Johney Maine. Milus Banister, MD 04/16/2020 10:43:52 AM This report has been signed electronically.

## 2020-04-16 NOTE — Progress Notes (Signed)
pt tolerated well. VSS. awake and to recovery. Report given to RN.  

## 2020-04-17 NOTE — Telephone Encounter (Signed)
The ulcer is not likely due to bile related diarrhea.

## 2020-04-18 ENCOUNTER — Telehealth: Payer: Self-pay

## 2020-04-18 ENCOUNTER — Telehealth: Payer: Self-pay | Admitting: Nurse Practitioner

## 2020-04-18 NOTE — Telephone Encounter (Signed)
  Follow up Call-  Call back number 04/16/2020 02/28/2019  Post procedure Call Back phone  # [phone]  Permission to leave phone message Yes Yes  Some recent data might be hidden     Patient questions:  Do you have a fever, pain , or abdominal swelling? No. Pain Score  0 *  Have you tolerated food without any problems? Yes.    Have you been able to return to your normal activities? Yes.    Do you have any questions about your discharge instructions: Diet   No. Medications  No. Follow up visit  No.  Do you have questions or concerns about your Care? No.  Actions: * If pain score is 4 or above: No action needed, pain <4. 1. Have you developed a fever since your procedure? no  2.   Have you had an respiratory symptoms (SOB or cough) since your procedure? no  3.   Have you tested positive for COVID 19 since your procedure no  4.   Have you had any family members/close contacts diagnosed with the COVID 19 since your procedure?  no   If yes to any of these questions please route to Joylene John, RN and Erenest Rasher, RN

## 2020-04-19 ENCOUNTER — Telehealth: Payer: Self-pay | Admitting: Nurse Practitioner

## 2020-04-19 DIAGNOSIS — R399 Unspecified symptoms and signs involving the genitourinary system: Secondary | ICD-10-CM | POA: Diagnosis not present

## 2020-04-19 DIAGNOSIS — R300 Dysuria: Secondary | ICD-10-CM | POA: Diagnosis not present

## 2020-04-19 DIAGNOSIS — I10 Essential (primary) hypertension: Secondary | ICD-10-CM | POA: Diagnosis not present

## 2020-04-19 NOTE — Telephone Encounter (Signed)
Patient called the answering service complaining of burning with urination.  She is status post colonoscopy colonoscopy by Dr. Ardis Hughs 5/5: 1. Ileocolonic anastomosis in the right colon was normal. 2. Colonic mucosa was normal throughout, random biopsies taken to check for microscopic colitis. 3. Colorectal anastomosis was widely patent. The anastomosis was 1cm from the anal verge and appeared to be a side to end anastomosis with a very short blind colon end (see images). There was a 5-96mm deep ulcer at the colorectal anastomosis. This does not appear malignant. The ulcer and adjacent mucosa was extensively biopsied. 4. The examination was otherwise normal  She stated her burning with urination started Friday, she took AZO but she has burning urination this morning.  No dysuria.  No fever, sweats or chills.  No abdominal pain.  She does not have a prior history of recurrent UTIs.  I asked the patient to go to the local urgent care clinic to submit a urine culture and for appropriate treatment.  Is reluctant to go to the urgent care and asked for antibiotic.  I then advised the patient a UTI is not a GI issue but clearly could have been triggered from a colonoscopy.  Therefore, she prefers not to go to the urgent care I recommended she call her primary care doctor who is on-call over the weekend to manage her UTI and treatment.  She agreed to do so.  Dr. Jac Canavan

## 2020-04-21 ENCOUNTER — Telehealth: Payer: Self-pay | Admitting: Nurse Practitioner

## 2020-04-21 ENCOUNTER — Telehealth: Payer: Self-pay | Admitting: Gastroenterology

## 2020-04-21 DIAGNOSIS — N898 Other specified noninflammatory disorders of vagina: Secondary | ICD-10-CM | POA: Diagnosis not present

## 2020-04-21 DIAGNOSIS — R399 Unspecified symptoms and signs involving the genitourinary system: Secondary | ICD-10-CM | POA: Diagnosis not present

## 2020-04-21 MED ORDER — LIDOCAINE-HYDROCORTISONE ACE 3-1 % RE KIT: 1.0000 "application " | PACK | Freq: Two times a day (BID) | RECTAL | 2 refills | Status: DC

## 2020-04-21 NOTE — Telephone Encounter (Signed)
Patient contacted. She did go to a walk in clinic over the weekend about the urinary sx's. It was a Development worker, community. She is expecting to hear back about the urine culture that was done. She reports she has had green discharge from her rectum or vagina, she is not certain which. She is relieved to hear that her biopsies did not show cancer. Patient is frequently tearful.  Follow up appointment made with Dr Ardis Hughs.

## 2020-04-21 NOTE — Telephone Encounter (Signed)
Pt states that medication for rectal pain is over $400. She wants to know if there is something more affordable.

## 2020-04-21 NOTE — Telephone Encounter (Signed)
Can you please tell her I"ve sent in lidocaine,steroid mixture cream to apply BID internally to the anus.  Hopefull this will help.  Please ask her about her urinary symptoms, if still bothered then lets order UA with UCx as well.  Thanks  She needs OV with me in next few weeks.  Thanks

## 2020-04-21 NOTE — Telephone Encounter (Signed)
Spoke with Kristopher Oppenheim and gave verbal over the phone.

## 2020-04-21 NOTE — Telephone Encounter (Signed)
Kathleen Flores is calling with questions regarding the Lidocaine that was called in- states that they need to know the quantity and exact item that is needed. Also stating that it is a high cost item.

## 2020-04-22 NOTE — Telephone Encounter (Signed)
Agree, will discuss with her tomorrow in the office.  Thanks

## 2020-04-22 NOTE — Telephone Encounter (Signed)
The patient is scheduled for follow up tomorrow. If there is something else you want to try today, I will call her with your instructions. She has so many questions that it seemed an appointment might be beneficial.

## 2020-04-23 ENCOUNTER — Encounter: Payer: Self-pay | Admitting: Gastroenterology

## 2020-04-23 ENCOUNTER — Other Ambulatory Visit: Payer: Self-pay

## 2020-04-23 ENCOUNTER — Ambulatory Visit (INDEPENDENT_AMBULATORY_CARE_PROVIDER_SITE_OTHER): Payer: PPO | Admitting: Gastroenterology

## 2020-04-23 VITALS — BP 166/74 | HR 90 | Temp 97.6°F | Ht 66.0 in | Wt 124.0 lb

## 2020-04-23 DIAGNOSIS — K929 Disease of digestive system, unspecified: Secondary | ICD-10-CM | POA: Diagnosis not present

## 2020-04-23 DIAGNOSIS — Z85038 Personal history of other malignant neoplasm of large intestine: Secondary | ICD-10-CM

## 2020-04-23 DIAGNOSIS — Y842 Radiological procedure and radiotherapy as the cause of abnormal reaction of the patient, or of later complication, without mention of misadventure at the time of the procedure: Secondary | ICD-10-CM | POA: Diagnosis not present

## 2020-04-23 MED ORDER — LOPERAMIDE HCL 2 MG PO CAPS: ORAL_CAPSULE | ORAL | 5 refills | Status: DC

## 2020-04-23 MED ORDER — CHOLESTYRAMINE 4 G PO PACK: PACK | ORAL | 2 refills | Status: DC
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
## 2020-04-23 NOTE — Patient Instructions (Addendum)
If you are age 73 or older, your body mass index should be between 23-30. Your Body mass index is 20.01 kg/m. If this is out of the aforementioned range listed, please consider follow up with your Primary Care Provider.  If you are age 58 or younger, your body mass index should be between 19-25. Your Body mass index is 20.01 kg/m. If this is out of the aformentioned range listed, please consider follow up with your Primary Care Provider.   You have been scheduled for an MRI at [facility] (1st floor) on 05-05-20. Your appointment time is 7:00am. Please arrive 30 minutes prior to your appointment time for registration purposes. Please make certain not to have anything to eat or drink after midnight prior to your test. In addition, if you have any metal in your body, have a pacemaker or defibrillator, please be sure to let your ordering physician know. This test typically takes 45 minutes to 1 hour to complete. Should you need to reschedule, please call [phone] to do so.  CHANGE: imodium to 1 capsule every morning, 2 capsules at lunch, and 1 at night  CHANGE: Questran to 2 packs in the morning and 1 pack in the evening.  Please purchase the following medications over the counter and take as directed:  Preparation H apply once daily.  Thank you for entrusting me with your care and choosing [facility].  Dr Ardis Hughs

## 2020-04-23 NOTE — Addendum Note (Signed)
Addended by: Stevan Born on: 04/23/2020 10:53 AM   Modules accepted: Orders

## 2020-04-25 DIAGNOSIS — E782 Mixed hyperlipidemia: Secondary | ICD-10-CM | POA: Diagnosis not present

## 2020-04-25 DIAGNOSIS — R739 Hyperglycemia, unspecified: Secondary | ICD-10-CM | POA: Diagnosis not present

## 2020-04-29 ENCOUNTER — Telehealth: Payer: Self-pay | Admitting: Gastroenterology

## 2020-04-29 DIAGNOSIS — C20 Malignant neoplasm of rectum: Secondary | ICD-10-CM | POA: Diagnosis not present

## 2020-04-29 DIAGNOSIS — E782 Mixed hyperlipidemia: Secondary | ICD-10-CM | POA: Diagnosis not present

## 2020-04-29 DIAGNOSIS — I10 Essential (primary) hypertension: Secondary | ICD-10-CM | POA: Diagnosis not present

## 2020-04-29 DIAGNOSIS — Z0000 Encounter for general adult medical examination without abnormal findings: Secondary | ICD-10-CM | POA: Diagnosis not present

## 2020-04-29 NOTE — Telephone Encounter (Signed)
I think linzess will backfire.  At her visit last week we decided she'd take 2 doses of cholestyramine every AM and 1 dose every PM.  Also imodium was to be 1 in am, 2 with lunch and 1 in PM.    Lets have her increase to 2 doses of cholestyramine BID and two imodium TID.  Both of those on a schedules basis, not PRN.

## 2020-04-29 NOTE — Telephone Encounter (Signed)
Patient requesting to speak with a nurse in reference to medication Linzess

## 2020-04-29 NOTE — Telephone Encounter (Signed)
The pt has a history of diarrhea and takes imodium and cholestyramine as directed at her last office visit with Dr Ardis Hughs.  She says she continues to have up to 20 watery stools every evening.  Mornings are ok.  She says she is watching her diet closely. I asked her about fiber and she tells me she will not take fiber because it makes the diarrhea worse.  She then asked if she could take Linzess to make her "go" all at once instead of multiple times daily. I takes with her about linzess being used for constipation and will make her have increased diarrhea but she believes it will help her have 1 stool and "get rid of everything at once instead of pooping all night" Please advise

## 2020-04-29 NOTE — Telephone Encounter (Signed)
The pt has been advised and agrees and states she will increase the imodium and cholestyramine as suggested and call back if she has no response.

## 2020-05-05 ENCOUNTER — Inpatient Hospital Stay: Payer: PPO | Attending: Oncology

## 2020-05-05 ENCOUNTER — Other Ambulatory Visit: Payer: Self-pay

## 2020-05-05 ENCOUNTER — Ambulatory Visit (HOSPITAL_COMMUNITY)
Admission: RE | Admit: 2020-05-05 | Discharge: 2020-05-05 | Disposition: A | Discharge status: Home / Self Care | Payer: PPO | Source: Ambulatory Visit | Attending: Gastroenterology | Admitting: Gastroenterology

## 2020-05-05 DIAGNOSIS — Y842 Radiological procedure and radiotherapy as the cause of abnormal reaction of the patient, or of later complication, without mention of misadventure at the time of the procedure: Secondary | ICD-10-CM | POA: Insufficient documentation

## 2020-05-05 DIAGNOSIS — Z85038 Personal history of other malignant neoplasm of large intestine: Secondary | ICD-10-CM

## 2020-05-05 DIAGNOSIS — C20 Malignant neoplasm of rectum: Secondary | ICD-10-CM

## 2020-05-05 DIAGNOSIS — K289 Gastrojejunal ulcer, unspecified as acute or chronic, without hemorrhage or perforation: Secondary | ICD-10-CM | POA: Insufficient documentation

## 2020-05-05 DIAGNOSIS — I10 Essential (primary) hypertension: Secondary | ICD-10-CM | POA: Insufficient documentation

## 2020-05-05 DIAGNOSIS — Z85048 Personal history of other malignant neoplasm of rectum, rectosigmoid junction, and anus: Secondary | ICD-10-CM | POA: Insufficient documentation

## 2020-05-05 DIAGNOSIS — C218 Malignant neoplasm of overlapping sites of rectum, anus and anal canal: Secondary | ICD-10-CM | POA: Diagnosis not present

## 2020-05-05 DIAGNOSIS — F419 Anxiety disorder, unspecified: Secondary | ICD-10-CM | POA: Diagnosis not present

## 2020-05-05 DIAGNOSIS — N898 Other specified noninflammatory disorders of vagina: Secondary | ICD-10-CM | POA: Diagnosis not present

## 2020-05-05 DIAGNOSIS — K929 Disease of digestive system, unspecified: Secondary | ICD-10-CM | POA: Insufficient documentation

## 2020-05-05 DIAGNOSIS — K604 Rectal fistula: Secondary | ICD-10-CM | POA: Diagnosis not present

## 2020-05-05 DIAGNOSIS — K626 Ulcer of anus and rectum: Secondary | ICD-10-CM | POA: Insufficient documentation

## 2020-05-05 LAB — BASIC METABOLIC PANEL - CANCER CENTER ONLY
Anion gap: 9 (ref 5–15)
BUN: 6 mg/dL — ABNORMAL LOW (ref 8–23)
CO2: 25 mmol/L (ref 22–32)
Calcium: 9.2 mg/dL (ref 8.9–10.3)
Chloride: 106 mmol/L (ref 98–111)
Creatinine: 0.66 mg/dL (ref 0.44–1.00)
GFR, Est AFR Am: >60 mL/min (ref >60)
GFR, Estimated: >60 mL/min (ref >60)
Glucose, Bld: 126 mg/dL — ABNORMAL HIGH (ref 70–99)
Potassium: 4.1 mmol/L (ref 3.5–5.1)
Sodium: 140 mmol/L (ref 135–145)

## 2020-05-05 LAB — CEA (IN HOUSE-CHCC): CEA (CHCC-In House): <1 ng/mL (ref 0.00–5.00)

## 2020-05-05 MED ORDER — GADOBUTROL 1 MMOL/ML IV SOLN
7.0000 mL | Freq: Once | INTRAVENOUS | Status: AC | PRN
  Administered 2020-05-05: 5 mL via INTRAVENOUS

## 2020-05-06 ENCOUNTER — Inpatient Hospital Stay (HOSPITAL_BASED_OUTPATIENT_CLINIC_OR_DEPARTMENT_OTHER): Payer: PPO | Admitting: Oncology

## 2020-05-06 ENCOUNTER — Telehealth: Payer: Self-pay | Admitting: Gastroenterology

## 2020-05-06 ENCOUNTER — Other Ambulatory Visit: Payer: Self-pay

## 2020-05-06 VITALS — BP 177/61 | HR 91 | Temp 97.7°F | Resp 18 | Ht 66.0 in | Wt 124.1 lb

## 2020-05-06 DIAGNOSIS — R197 Diarrhea, unspecified: Secondary | ICD-10-CM

## 2020-05-06 DIAGNOSIS — C20 Malignant neoplasm of rectum: Secondary | ICD-10-CM | POA: Diagnosis not present

## 2020-05-06 DIAGNOSIS — K929 Disease of digestive system, unspecified: Secondary | ICD-10-CM

## 2020-05-06 DIAGNOSIS — Z85038 Personal history of other malignant neoplasm of large intestine: Secondary | ICD-10-CM

## 2020-05-06 DIAGNOSIS — Z85048 Personal history of other malignant neoplasm of rectum, rectosigmoid junction, and anus: Secondary | ICD-10-CM | POA: Diagnosis not present

## 2020-05-06 NOTE — Telephone Encounter (Signed)
I called all imaging locations including [place] [facility] and no one can get her in today for CT scan.  Please advise

## 2020-05-06 NOTE — Telephone Encounter (Signed)
See the results note from MRI this morning.  Further communication with her surgeon and now I actually want to change the recommendation from Gastrografin enema to a CT scan abdomen and pelvis with rectal contrast and not oral contrast.  IV contrast still would be helpful.  This needs to be done hopefully today.  She may have an abscess.

## 2020-05-06 NOTE — Telephone Encounter (Signed)
Tomorrow should be safe, thanks

## 2020-05-06 NOTE — Telephone Encounter (Signed)
CT at [facility] CT  tomorrow at 230 pm NPO 4 hours arrive at 2 pm.  The pt has been advised and instructed.  She will be seeing her surgeon today as well.

## 2020-05-06 NOTE — Progress Notes (Signed)
[place] OFFICE PROGRESS NOTE   Diagnosis: Rectal cancer  INTERVAL HISTORY:   Kathleen Flores has frequent bowel movements up to 10-15 times per day.  She has irritation of the perineum from wiping.  She is followed by Dr. Ardis Hughs for ulceration at the anal anastomosis.  She reports improvement in stool frequency since starting cholestyramine. She underwent an MRI of the abdomen earlier today.  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  No evidence of metastatic disease.  Diffuse rectal wall thickening and wall thickening involving pelvic small bowel loops.  No fever. Objective:  Vital signs in last 24 hours:  Blood pressure (!) 177/61, pulse 91, temperature 97.7 F (36.5 C), temperature source Temporal, resp. rate 18, height 5\' 6"  (1.676 m), weight 124 lb 1.6 oz (56.3 kg), SpO2 100 %.     Lymphatics: No cervical, supraclavicular, axillary, or inguinal nodes Resp: Lungs clear bilaterally Cardio: Regular rate and rhythm GI: Nontender, no mass, no hepatosplenomegaly Vascular: No leg edema, the right lower leg is slightly larger than the left side   Lab Results:  Lab Results  Component Value Date   WBC 7.5 03/23/2020   HGB 10.5 (L) 03/23/2020   HCT 33.4 (L) 03/23/2020   MCV 90.5 03/23/2020   PLT 232 03/23/2020   NEUTROABS 4.0 11/20/2019    CMP  Lab Results  Component Value Date   NA 140 05/05/2020   K 4.1 05/05/2020   CL 106 05/05/2020   CO2 25 05/05/2020   GLUCOSE 126 (H) 05/05/2020   BUN 6 (L) 05/05/2020   CREATININE 0.66 05/05/2020   CALCIUM 9.2 05/05/2020   PROT 7.6 11/20/2019   ALBUMIN 4.1 11/20/2019   AST 22 11/20/2019   ALT 16 11/20/2019   ALKPHOS 121 11/20/2019   BILITOT 0.6 11/20/2019   GFRNONAA >60 05/05/2020   GFRAA >60 05/05/2020    Lab Results  Component Value Date   CEA1 <1.00 05/05/2020    Imaging:  MR PELVIS W WO CONTRAST  Result Date: 05/05/2020 CLINICAL DATA:   Rectal carcinoma. Previous APR and radiation therapy, with ulcer at anastomosis. EXAM: MRI ABDOMEN AND PELVIS WITHOUT AND WITH CONTRAST TECHNIQUE: Multiplanar multisequence MR imaging of the abdomen and pelvis was performed both before and after the administration of intravenous contrast. CONTRAST:  52mL GADAVIST GADOBUTROL 1 MMOL/ML IV SOLN COMPARISON:  CT on 06/22/2019 FINDINGS: COMBINED FINDINGS FOR BOTH MR ABDOMEN AND PELVIS Lower Chest: No acute findings. Hepatobiliary: No hepatic masses identified. Gallbladder is unremarkable. No evidence of biliary ductal dilatation. Pancreas:  No mass or inflammatory changes. Spleen: Within normal limits in size and appearance. Adrenals/Urinary Tract: No masses identified. No evidence of ureteral calculi or hydronephrosis. Stomach/Bowel: Diffuse rectal wall thickening is seen, as well as wall thickening involving pelvic small bowel loops, likely due to previous radiation therapy. Ill-defined presacral and perirectal soft tissue density has increased since previous study, also likely due to post treatment changes. This limits evaluation, however no discrete mass identified. A fistula is seen arising from the right lateral wall of the mid rectum (image 18/4). A multiloculated fluid and gas collection is seen within the posterior perirectal and presacral space. In aggregate, this collection measures 9.0 x 1.7 x 4.1 cm. Vascular/Lymphatic: No pathologically enlarged lymph nodes. No abdominal aortic aneurysm. Reproductive: Prior hysterectomy. No mass or other significant abnormality. Other:  None. Musculoskeletal:  No suspicious bone lesions identified. IMPRESSION: Ill-defined presacral and perirectal soft tissue density has increased since  previous study, likely due to post treatment changes. This limits evaluation, however no discrete mass identified. 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 Abscess cannot be excluded. No evidence of  metastatic disease. Electronically Signed   By: Marlaine Hind M.D.   On: 05/05/2020 11:58   MR ABDOMEN WWO CONTRAST  Result Date: 05/05/2020 CLINICAL DATA:  Rectal carcinoma. Previous APR and radiation therapy, with ulcer at anastomosis. EXAM: MRI ABDOMEN AND PELVIS WITHOUT AND WITH CONTRAST TECHNIQUE: Multiplanar multisequence MR imaging of the abdomen and pelvis was performed both before and after the administration of intravenous contrast. CONTRAST:  83mL GADAVIST GADOBUTROL 1 MMOL/ML IV SOLN COMPARISON:  CT on 06/22/2019 FINDINGS: COMBINED FINDINGS FOR BOTH MR ABDOMEN AND PELVIS Lower Chest: No acute findings. Hepatobiliary: No hepatic masses identified. Gallbladder is unremarkable. No evidence of biliary ductal dilatation. Pancreas:  No mass or inflammatory changes. Spleen: Within normal limits in size and appearance. Adrenals/Urinary Tract: No masses identified. No evidence of ureteral calculi or hydronephrosis. Stomach/Bowel: Diffuse rectal wall thickening is seen, as well as wall thickening involving pelvic small bowel loops, likely due to previous radiation therapy. Ill-defined presacral and perirectal soft tissue density has increased since previous study, also likely due to post treatment changes. This limits evaluation, however no discrete mass identified. A fistula is seen arising from the right lateral wall of the mid rectum (image 18/4). A multiloculated fluid and gas collection is seen within the posterior perirectal and presacral space. In aggregate, this collection measures 9.0 x 1.7 x 4.1 cm. Vascular/Lymphatic: No pathologically enlarged lymph nodes. No abdominal aortic aneurysm. Reproductive: Prior hysterectomy. No mass or other significant abnormality. Other:  None. Musculoskeletal:  No suspicious bone lesions identified. IMPRESSION: Ill-defined presacral and perirectal soft tissue density has increased since previous study, likely due to post treatment changes. This limits evaluation,  however no discrete mass identified. 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 Abscess cannot be excluded. No evidence of metastatic disease. Electronically Signed   By: Marlaine Hind M.D.   On: 05/05/2020 11:58    Medications: I have reviewed the patient's current medications.   Assessment/Plan: 1. Rectal cancer ? Nonobstructing mass in the mid rectum on colonoscopy 02/28/2019, 7 cm from the anal verge, biopsy confirmed adenocarcinoma-at least intramucosal ? CTs 03/05/2019-eccentric soft tissue thickening at the low rectum, multiple tiny lung nodules-nonspecific, 1 cm left adrenal nodule, no definite evidence of metastatic disease ? MR pelvis 03/08/2019-tumor at 6.5 cm from the internal anal sphincter, T3b,N1(single 7 mm right perirectal lymph node) ? Radiation/Xeloda 03/19/2019-04/27/2019, Xeloda completed 04/25/2019 ? CT abdomen/pelvis 06/22/2019-dilated ileum with diffuse wall thickening and mucosal enhancement, no rectal tumor seen, 0.9 cm right lower quadrant ileocolic node, no pelvic or inguinal adenopathy ? Low anterior resection, ileocecectomy, and diverting loop ileostomy 07/12/2019,[id number]-differentiated adenocarcinoma of the rectum, 1/22 lymph nodes positive, one tumor deposit, negative margins ? Cycle 1 Xeloda 08/20/2019 ? Cycle 2 Xeloda 09/10/2019 ? Cycle 3 Xeloda 09/29/2019 ? Cycle 4 Xeloda 10/18/2019, placed on hold beginning 10/22/2019 due to hand-foot syndrome, Xeloda resumed 10/23/2019 at a reduced dose of 1000 mg twice daily for the remainder of the cycle, discontinued 1116 secondary to burning of the hands and feet ? Cycle 5 Xeloda11/24/20-dose reduced to 500 mg twice daily (discontinued after 7 days due to hand-foot syndrome) ? Ileostomy reversal 12/10/2019 2. Hypertension 3. Anxiety 4. 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 Ardis Hughs and Dr. 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  She remains symptomatic with rectal frequency and pain.  She has developed a rectal fistula.  She describes a green discharge from the vagina, potentially related to fistula formation.  Kathleen Flores is scheduled for a CT with rectal contrast tomorrow.  We will ask for a chest CT at the same time.  She will continue follow-up with Drs. Edison Nasuti and Gross for management of the ulcer, fistula, and irregular bowel habits.  She will return for an office visit and CEA in 6 months.  Betsy Coder, MD  05/06/2020  4:27 PM

## 2020-05-07 ENCOUNTER — Telehealth: Payer: Self-pay

## 2020-05-07 ENCOUNTER — Other Ambulatory Visit: Payer: Self-pay

## 2020-05-07 ENCOUNTER — Ambulatory Visit (INDEPENDENT_AMBULATORY_CARE_PROVIDER_SITE_OTHER)
Admission: RE | Admit: 2020-05-07 | Discharge: 2020-05-07 | Disposition: A | Discharge status: Home / Self Care | Payer: PPO | Source: Ambulatory Visit | Attending: Gastroenterology | Admitting: Gastroenterology

## 2020-05-07 ENCOUNTER — Telehealth: Payer: Self-pay | Admitting: Oncology

## 2020-05-07 DIAGNOSIS — R197 Diarrhea, unspecified: Secondary | ICD-10-CM

## 2020-05-07 DIAGNOSIS — K929 Disease of digestive system, unspecified: Secondary | ICD-10-CM

## 2020-05-07 DIAGNOSIS — Z85038 Personal history of other malignant neoplasm of large intestine: Secondary | ICD-10-CM | POA: Diagnosis not present

## 2020-05-07 DIAGNOSIS — R918 Other nonspecific abnormal finding of lung field: Secondary | ICD-10-CM | POA: Diagnosis not present

## 2020-05-07 DIAGNOSIS — C189 Malignant neoplasm of colon, unspecified: Secondary | ICD-10-CM | POA: Diagnosis not present

## 2020-05-07 MED ORDER — IOHEXOL 300 MG/ML  SOLN
100.0000 mL | Freq: Once | INTRAMUSCULAR | Status: AC | PRN
  Administered 2020-05-07: 100 mL via INTRAVENOUS

## 2020-05-07 NOTE — Telephone Encounter (Signed)
-----   Message from Milus Banister, MD sent at 05/07/2020  5:18 AM EDT ----- Will do, yes.   Aimie Wagman, She was going to be getting restating CTs this week.  Can you add them on for today's abd CT scan (chest CT with IV contrast).  Thanks  ----- Message ----- From: Ladell Pier, MD Sent: 05/06/2020   4:50 PM EDT To: Milus Banister, MD  I saw her today.  She was scheduled for restaging CTs this week, but they were canceled due to the MRI  She is scheduled for a CT abdomen/pelvis tomorrow at [facility]. Can you add a chest CT?  Thanks,  [employer]

## 2020-05-07 NOTE — Telephone Encounter (Signed)
CT chest added and [facility] notified.  Kathleen Flores sent a message regarding adding the Chest CT

## 2020-05-07 NOTE — Telephone Encounter (Signed)
Scheduled per 5/25 los. Mailing pt calendar.

## 2020-05-08 ENCOUNTER — Ambulatory Visit (HOSPITAL_COMMUNITY): Payer: Self-pay | Admitting: Surgery

## 2020-05-09 ENCOUNTER — Encounter (HOSPITAL_COMMUNITY): Payer: Self-pay | Admitting: Surgery

## 2020-05-09 ENCOUNTER — Other Ambulatory Visit (HOSPITAL_COMMUNITY)
Admission: RE | Admit: 2020-05-09 | Discharge: 2020-05-09 | Disposition: A | Discharge status: Home / Self Care | Payer: PPO | Source: Ambulatory Visit | Attending: Surgery | Admitting: Surgery

## 2020-05-09 ENCOUNTER — Other Ambulatory Visit: Payer: Self-pay

## 2020-05-09 DIAGNOSIS — Z01812 Encounter for preprocedural laboratory examination: Secondary | ICD-10-CM | POA: Diagnosis not present

## 2020-05-09 DIAGNOSIS — Z20822 Contact with and (suspected) exposure to covid-19: Secondary | ICD-10-CM | POA: Insufficient documentation

## 2020-05-09 NOTE — Progress Notes (Signed)
Pt aware to arrive at [facility] admitting on 05/13/2020 for scheduled surgical procedure at 11:30am.

## 2020-05-10 LAB — SARS CORONAVIRUS 2 (TAT 6-24 HRS): SARS Coronavirus 2: NEGATIVE

## 2020-05-12 MED ORDER — BUPIVACAINE LIPOSOME 1.3 % IJ SUSP
20.0000 mL | Freq: Once | INTRAMUSCULAR | Status: DC
  Filled 2020-05-12: qty 20

## 2020-05-13 ENCOUNTER — Observation Stay (HOSPITAL_COMMUNITY)
Admission: RE | Admit: 2020-05-13 | Discharge: 2020-05-14 | Disposition: A | Discharge status: Home / Self Care | Payer: PPO | Attending: Surgery | Admitting: Surgery

## 2020-05-13 ENCOUNTER — Encounter (HOSPITAL_COMMUNITY)
Admission: RE | Disposition: A | Discharge status: Home / Self Care | Payer: Self-pay | Source: Home / Self Care | Attending: Surgery

## 2020-05-13 ENCOUNTER — Ambulatory Visit (HOSPITAL_COMMUNITY): Payer: PPO | Admitting: Certified Registered Nurse Anesthetist

## 2020-05-13 ENCOUNTER — Encounter (HOSPITAL_COMMUNITY): Payer: Self-pay | Admitting: Surgery

## 2020-05-13 DIAGNOSIS — Z87891 Personal history of nicotine dependence: Secondary | ICD-10-CM | POA: Diagnosis not present

## 2020-05-13 DIAGNOSIS — C19 Malignant neoplasm of rectosigmoid junction: Secondary | ICD-10-CM | POA: Insufficient documentation

## 2020-05-13 DIAGNOSIS — E039 Hypothyroidism, unspecified: Secondary | ICD-10-CM | POA: Diagnosis not present

## 2020-05-13 DIAGNOSIS — E782 Mixed hyperlipidemia: Secondary | ICD-10-CM | POA: Diagnosis not present

## 2020-05-13 DIAGNOSIS — Z9889 Other specified postprocedural states: Secondary | ICD-10-CM | POA: Diagnosis present

## 2020-05-13 DIAGNOSIS — K632 Fistula of intestine: Secondary | ICD-10-CM | POA: Diagnosis not present

## 2020-05-13 DIAGNOSIS — I10 Essential (primary) hypertension: Secondary | ICD-10-CM | POA: Diagnosis present

## 2020-05-13 DIAGNOSIS — K604 Rectal fistula: Principal | ICD-10-CM | POA: Insufficient documentation

## 2020-05-13 DIAGNOSIS — N823 Fistula of vagina to large intestine: Secondary | ICD-10-CM | POA: Diagnosis not present

## 2020-05-13 DIAGNOSIS — N739 Female pelvic inflammatory disease, unspecified: Secondary | ICD-10-CM | POA: Diagnosis present

## 2020-05-13 DIAGNOSIS — C20 Malignant neoplasm of rectum: Secondary | ICD-10-CM | POA: Diagnosis present

## 2020-05-13 HISTORY — PX: EVALUATION UNDER ANESTHESIA WITH ANAL FISTULECTOMY: SHX5621

## 2020-05-13 LAB — CBC
HCT: 33.8 % — ABNORMAL LOW (ref 36.0–46.0)
Hemoglobin: 10.5 g/dL — ABNORMAL LOW (ref 12.0–15.0)
MCH: 27.7 pg (ref 26.0–34.0)
MCHC: 31.1 g/dL (ref 30.0–36.0)
MCV: 89.2 fL (ref 80.0–100.0)
Platelets: 275 10*3/uL (ref 150–400)
RBC: 3.79 MIL/uL — ABNORMAL LOW (ref 3.87–5.11)
RDW: 14.3 % (ref 11.5–15.5)
WBC: 5.9 10*3/uL (ref 4.0–10.5)
nRBC: 0 % (ref 0.0–0.2)

## 2020-05-13 LAB — GLUCOSE, CAPILLARY: Glucose-Capillary: 135 mg/dL — ABNORMAL HIGH (ref 70–99)

## 2020-05-13 SURGERY — EXAM UNDER ANESTHESIA WITH ANAL FISTULECTOMY
Anesthesia: General

## 2020-05-13 MED ORDER — SODIUM CHLORIDE 0.9 % IV SOLN: 250.0000 mL | INTRAVENOUS | Status: DC | PRN

## 2020-05-13 MED ORDER — ASCORBIC ACID 500 MG PO TABS
1000.0000 mg | ORAL_TABLET | Freq: Every day | ORAL | Status: DC
  Administered 2020-05-14: 1000 mg via ORAL
  Filled 2020-05-13: qty 2

## 2020-05-13 MED ORDER — HYDROCODONE-ACETAMINOPHEN 5-325 MG PO TABS: 1.0000 | ORAL_TABLET | Freq: Four times a day (QID) | ORAL | 0 refills | Status: DC | PRN

## 2020-05-13 MED ORDER — CHLORHEXIDINE GLUCONATE CLOTH 2 % EX PADS: 6.0000 | MEDICATED_PAD | Freq: Once | CUTANEOUS | Status: DC

## 2020-05-13 MED ORDER — BUPIVACAINE-EPINEPHRINE 0.25% -1:200000 IJ SOLN
INTRAMUSCULAR | Status: DC | PRN
  Administered 2020-05-13: 20 mL

## 2020-05-13 MED ORDER — ONDANSETRON HCL 4 MG PO TABS: 4.0000 mg | ORAL_TABLET | Freq: Four times a day (QID) | ORAL | Status: DC | PRN

## 2020-05-13 MED ORDER — ROCURONIUM BROMIDE 10 MG/ML (PF) SYRINGE
PREFILLED_SYRINGE | INTRAVENOUS | Status: AC
  Filled 2020-05-13: qty 10

## 2020-05-13 MED ORDER — BUPIVACAINE-EPINEPHRINE 0.25% -1:200000 IJ SOLN
INTRAMUSCULAR | Status: AC
  Filled 2020-05-13: qty 1

## 2020-05-13 MED ORDER — EPHEDRINE SULFATE-NACL 50-0.9 MG/10ML-% IV SOSY
PREFILLED_SYRINGE | INTRAVENOUS | Status: DC | PRN
  Administered 2020-05-13: 10 mg via INTRAVENOUS
  Administered 2020-05-13 (×2): 5 mg via INTRAVENOUS
  Administered 2020-05-13: 10 mg via INTRAVENOUS

## 2020-05-13 MED ORDER — LIDOCAINE 2% (20 MG/ML) 5 ML SYRINGE
INTRAMUSCULAR | Status: DC | PRN
Start: 2020-05-13 — End: 2020-05-13
  Administered 2020-05-13: 1.5 mg/kg/h via INTRAVENOUS

## 2020-05-13 MED ORDER — CALCIUM CARBONATE-VITAMIN D 500-200 MG-UNIT PO TABS
1.0000 | ORAL_TABLET | Freq: Every day | ORAL | Status: DC
  Administered 2020-05-14: 1 via ORAL
  Filled 2020-05-13: qty 1

## 2020-05-13 MED ORDER — ACETAMINOPHEN 10 MG/ML IV SOLN: 1000.0000 mg | Freq: Once | INTRAVENOUS | Status: DC | PRN

## 2020-05-13 MED ORDER — PROCHLORPERAZINE MALEATE 10 MG PO TABS
10.0000 mg | ORAL_TABLET | Freq: Four times a day (QID) | ORAL | Status: DC | PRN
  Filled 2020-05-13: qty 1

## 2020-05-13 MED ORDER — ZINC GLUCONATE 50 MG PO TABS: 50.0000 mg | ORAL_TABLET | Freq: Every day | ORAL | Status: DC

## 2020-05-13 MED ORDER — PHENYLEPHRINE 40 MCG/ML (10ML) SYRINGE FOR IV PUSH (FOR BLOOD PRESSURE SUPPORT)
PREFILLED_SYRINGE | INTRAVENOUS | Status: AC
  Filled 2020-05-13: qty 10

## 2020-05-13 MED ORDER — FENTANYL CITRATE (PF) 100 MCG/2ML IJ SOLN
INTRAMUSCULAR | Status: DC | PRN
  Administered 2020-05-13 (×3): 50 ug via INTRAVENOUS

## 2020-05-13 MED ORDER — LOPERAMIDE HCL 2 MG PO CAPS: 2.0000 mg | ORAL_CAPSULE | Freq: Three times a day (TID) | ORAL | Status: DC | PRN

## 2020-05-13 MED ORDER — MELATONIN 5 MG PO TABS
10.0000 mg | ORAL_TABLET | Freq: Every evening | ORAL | Status: DC | PRN
  Administered 2020-05-13: 10 mg via ORAL
  Filled 2020-05-13: qty 2

## 2020-05-13 MED ORDER — PROMETHAZINE HCL 25 MG/ML IJ SOLN: 6.2500 mg | INTRAMUSCULAR | Status: DC | PRN

## 2020-05-13 MED ORDER — WITCH HAZEL-GLYCERIN EX PADS: 1.0000 "application " | MEDICATED_PAD | CUTANEOUS | Status: DC | PRN

## 2020-05-13 MED ORDER — SODIUM CHLORIDE 0.9 % IV SOLN
2.0000 g | Freq: Two times a day (BID) | INTRAVENOUS | Status: AC
  Administered 2020-05-14: 2 g via INTRAVENOUS
  Filled 2020-05-13: qty 2

## 2020-05-13 MED ORDER — ONDANSETRON HCL 4 MG/2ML IJ SOLN
INTRAMUSCULAR | Status: AC
  Filled 2020-05-13: qty 2

## 2020-05-13 MED ORDER — ACETAMINOPHEN 500 MG PO TABS
1000.0000 mg | ORAL_TABLET | Freq: Four times a day (QID) | ORAL | Status: DC
  Filled 2020-05-13: qty 2

## 2020-05-13 MED ORDER — MEPERIDINE HCL 50 MG/ML IJ SOLN: 6.2500 mg | INTRAMUSCULAR | Status: DC | PRN

## 2020-05-13 MED ORDER — FERROUS SULFATE 325 (65 FE) MG PO TABS
325.0000 mg | ORAL_TABLET | Freq: Two times a day (BID) | ORAL | Status: DC
  Administered 2020-05-14: 325 mg via ORAL
  Filled 2020-05-13: qty 1

## 2020-05-13 MED ORDER — BUPIVACAINE LIPOSOME 1.3 % IJ SUSP
INTRAMUSCULAR | Status: DC | PRN
  Administered 2020-05-13: 20 mL

## 2020-05-13 MED ORDER — DIPHENHYDRAMINE HCL 50 MG/ML IJ SOLN: 12.5000 mg | Freq: Four times a day (QID) | INTRAMUSCULAR | Status: DC | PRN

## 2020-05-13 MED ORDER — DEXAMETHASONE SODIUM PHOSPHATE 10 MG/ML IJ SOLN
INTRAMUSCULAR | Status: AC
  Filled 2020-05-13: qty 1

## 2020-05-13 MED ORDER — FENTANYL CITRATE (PF) 250 MCG/5ML IJ SOLN
INTRAMUSCULAR | Status: AC
  Filled 2020-05-13: qty 5

## 2020-05-13 MED ORDER — LIDOCAINE 2% (20 MG/ML) 5 ML SYRINGE
INTRAMUSCULAR | Status: AC
  Filled 2020-05-13: qty 10

## 2020-05-13 MED ORDER — PHENYLEPHRINE 40 MCG/ML (10ML) SYRINGE FOR IV PUSH (FOR BLOOD PRESSURE SUPPORT)
PREFILLED_SYRINGE | INTRAVENOUS | Status: DC | PRN
  Administered 2020-05-13 (×3): 80 ug via INTRAVENOUS
  Administered 2020-05-13 (×2): 40 ug via INTRAVENOUS

## 2020-05-13 MED ORDER — MAGIC MOUTHWASH
15.0000 mL | Freq: Four times a day (QID) | ORAL | Status: DC | PRN
  Filled 2020-05-13: qty 15

## 2020-05-13 MED ORDER — AMOXICILLIN-POT CLAVULANATE 875-125 MG PO TABS: 1.0000 | ORAL_TABLET | Freq: Two times a day (BID) | ORAL | 1 refills | Status: DC

## 2020-05-13 MED ORDER — DIPHENHYDRAMINE HCL 12.5 MG/5ML PO ELIX: 12.5000 mg | ORAL_SOLUTION | Freq: Four times a day (QID) | ORAL | Status: DC | PRN

## 2020-05-13 MED ORDER — LOPERAMIDE HCL 2 MG PO CAPS
4.0000 mg | ORAL_CAPSULE | Freq: Every day | ORAL | Status: DC
  Filled 2020-05-13: qty 2

## 2020-05-13 MED ORDER — SODIUM CHLORIDE 0.9% FLUSH
3.0000 mL | Freq: Two times a day (BID) | INTRAVENOUS | Status: DC
  Administered 2020-05-13: 3 mL via INTRAVENOUS

## 2020-05-13 MED ORDER — ONDANSETRON HCL 4 MG/2ML IJ SOLN: 4.0000 mg | Freq: Four times a day (QID) | INTRAMUSCULAR | Status: DC | PRN

## 2020-05-13 MED ORDER — CHLORHEXIDINE GLUCONATE 0.12 % MT SOLN
15.0000 mL | Freq: Once | OROMUCOSAL | Status: AC
  Administered 2020-05-13: 15 mL via OROMUCOSAL
  Filled 2020-05-13: qty 15

## 2020-05-13 MED ORDER — PROPOFOL 10 MG/ML IV BOLUS
INTRAVENOUS | Status: DC | PRN
  Administered 2020-05-13: 110 mg via INTRAVENOUS

## 2020-05-13 MED ORDER — CHOLESTYRAMINE 4 G PO PACK
8.0000 g | PACK | Freq: Two times a day (BID) | ORAL | Status: DC
  Administered 2020-05-13: 8 g via ORAL
  Filled 2020-05-13 (×2): qty 2

## 2020-05-13 MED ORDER — LOPERAMIDE HCL 2 MG PO CAPS
2.0000 mg | ORAL_CAPSULE | Freq: Two times a day (BID) | ORAL | Status: DC
  Administered 2020-05-13 – 2020-05-14 (×2): 2 mg via ORAL
  Filled 2020-05-13: qty 1

## 2020-05-13 MED ORDER — METHYLENE BLUE 0.5 % INJ SOLN
INTRAVENOUS | Status: AC
  Filled 2020-05-13: qty 10

## 2020-05-13 MED ORDER — PROPOFOL 10 MG/ML IV BOLUS
INTRAVENOUS | Status: AC
  Filled 2020-05-13: qty 20

## 2020-05-13 MED ORDER — ONDANSETRON HCL 4 MG/2ML IJ SOLN
INTRAMUSCULAR | Status: DC | PRN
  Administered 2020-05-13: 4 mg via INTRAVENOUS

## 2020-05-13 MED ORDER — ZINC OXIDE 40 % EX OINT
TOPICAL_OINTMENT | Freq: Two times a day (BID) | CUTANEOUS | Status: DC
  Administered 2020-05-13: 1 via TOPICAL
  Filled 2020-05-13: qty 57

## 2020-05-13 MED ORDER — LACTATED RINGERS IV SOLN: INTRAVENOUS | Status: DC

## 2020-05-13 MED ORDER — ACETAMINOPHEN 500 MG PO TABS
1000.0000 mg | ORAL_TABLET | ORAL | Status: AC
  Administered 2020-05-13: 1000 mg via ORAL
  Filled 2020-05-13: qty 2

## 2020-05-13 MED ORDER — ALUM & MAG HYDROXIDE-SIMETH 200-200-20 MG/5ML PO SUSP: 30.0000 mL | Freq: Four times a day (QID) | ORAL | Status: DC | PRN

## 2020-05-13 MED ORDER — SODIUM CHLORIDE 0.9% FLUSH: 3.0000 mL | INTRAVENOUS | Status: DC | PRN

## 2020-05-13 MED ORDER — HYDROMORPHONE HCL 1 MG/ML IJ SOLN: 0.5000 mg | INTRAMUSCULAR | Status: DC | PRN

## 2020-05-13 MED ORDER — VITAMIN B-12 5000 MCG SL SUBL: 5000.0000 ug | SUBLINGUAL_TABLET | Freq: Every day | SUBLINGUAL | Status: DC

## 2020-05-13 MED ORDER — ENOXAPARIN SODIUM 40 MG/0.4ML ~~LOC~~ SOLN
40.0000 mg | SUBCUTANEOUS | Status: DC
  Administered 2020-05-14: 40 mg via SUBCUTANEOUS
  Filled 2020-05-13: qty 0.4

## 2020-05-13 MED ORDER — ORAL CARE MOUTH RINSE: 15.0000 mL | Freq: Once | OROMUCOSAL | Status: AC

## 2020-05-13 MED ORDER — VITAMIN B-12 1000 MCG PO TABS
5000.0000 ug | ORAL_TABLET | Freq: Every day | ORAL | Status: DC
  Administered 2020-05-14: 5000 ug via ORAL
  Filled 2020-05-13: qty 5

## 2020-05-13 MED ORDER — GABAPENTIN 100 MG PO CAPS
200.0000 mg | ORAL_CAPSULE | Freq: Three times a day (TID) | ORAL | Status: DC
  Administered 2020-05-13 – 2020-05-14 (×2): 200 mg via ORAL
  Filled 2020-05-13 (×2): qty 2

## 2020-05-13 MED ORDER — DIBUCAINE (PERIANAL) 1 % EX OINT
TOPICAL_OINTMENT | CUTANEOUS | Status: AC
  Filled 2020-05-13: qty 28

## 2020-05-13 MED ORDER — LEVOTHYROXINE SODIUM 125 MCG PO TABS
125.0000 ug | ORAL_TABLET | Freq: Every day | ORAL | Status: DC
  Administered 2020-05-14: 125 ug via ORAL
  Filled 2020-05-13: qty 1

## 2020-05-13 MED ORDER — SODIUM CHLORIDE 0.9 % IV SOLN
2.0000 g | INTRAVENOUS | Status: AC
  Administered 2020-05-13: 2 g via INTRAVENOUS
  Filled 2020-05-13: qty 2

## 2020-05-13 MED ORDER — DEXAMETHASONE SODIUM PHOSPHATE 4 MG/ML IJ SOLN
4.0000 mg | INTRAMUSCULAR | Status: AC
  Administered 2020-05-13: 4 mg via INTRAVENOUS

## 2020-05-13 MED ORDER — LIP MEDEX EX OINT
1.0000 "application " | TOPICAL_OINTMENT | Freq: Two times a day (BID) | CUTANEOUS | Status: DC
  Filled 2020-05-13: qty 7

## 2020-05-13 MED ORDER — ENSURE SURGERY PO LIQD
237.0000 mL | Freq: Two times a day (BID) | ORAL | Status: DC
  Filled 2020-05-13 (×2): qty 237

## 2020-05-13 MED ORDER — CALCIUM POLYCARBOPHIL 625 MG PO TABS
625.0000 mg | ORAL_TABLET | Freq: Two times a day (BID) | ORAL | Status: DC
  Administered 2020-05-13 – 2020-05-14 (×2): 625 mg via ORAL
  Filled 2020-05-13 (×2): qty 1

## 2020-05-13 MED ORDER — ENALAPRILAT 1.25 MG/ML IV SOLN
0.6250 mg | Freq: Four times a day (QID) | INTRAVENOUS | Status: DC | PRN
  Filled 2020-05-13: qty 1

## 2020-05-13 MED ORDER — SODIUM CHLORIDE 0.9 % IV SOLN: Freq: Three times a day (TID) | INTRAVENOUS | Status: DC | PRN

## 2020-05-13 MED ORDER — LIDOCAINE 2% (20 MG/ML) 5 ML SYRINGE
INTRAMUSCULAR | Status: AC
  Filled 2020-05-13: qty 5

## 2020-05-13 MED ORDER — HYDROMORPHONE HCL 1 MG/ML IJ SOLN: 0.2500 mg | INTRAMUSCULAR | Status: DC | PRN

## 2020-05-13 MED ORDER — EPHEDRINE 5 MG/ML INJ
INTRAVENOUS | Status: AC
  Filled 2020-05-13: qty 10

## 2020-05-13 MED ORDER — SODIUM CHLORIDE 0.9 % IR SOLN
Status: DC | PRN
  Administered 2020-05-13: 1000 mL

## 2020-05-13 MED ORDER — PROCHLORPERAZINE EDISYLATE 10 MG/2ML IJ SOLN: 5.0000 mg | Freq: Four times a day (QID) | INTRAMUSCULAR | Status: DC | PRN

## 2020-05-13 MED ORDER — TRAMADOL HCL 50 MG PO TABS: 50.0000 mg | ORAL_TABLET | Freq: Four times a day (QID) | ORAL | Status: DC | PRN

## 2020-05-13 MED ORDER — GABAPENTIN 300 MG PO CAPS
300.0000 mg | ORAL_CAPSULE | ORAL | Status: AC
  Administered 2020-05-13: 300 mg via ORAL
  Filled 2020-05-13: qty 1

## 2020-05-13 MED ORDER — DIBUCAINE (PERIANAL) 1 % EX OINT
TOPICAL_OINTMENT | CUTANEOUS | Status: DC | PRN
  Administered 2020-05-13: 1 via RECTAL

## 2020-05-13 MED ORDER — LIDOCAINE 2% (20 MG/ML) 5 ML SYRINGE
INTRAMUSCULAR | Status: DC | PRN
Start: 2020-05-13 — End: 2020-05-13
  Administered 2020-05-13: 40 mg via INTRAVENOUS

## 2020-05-13 SURGICAL SUPPLY — 39 items
BLADE SURG 15 STRL LF DISP TIS (BLADE) ×1 IMPLANT
BLADE SURG 15 STRL SS (BLADE) ×3
BRIEF STRETCH FOR OB PAD LRG (UNDERPADS AND DIAPERS) ×3 IMPLANT
CNTNR URN SCR LID CUP LEK RST (MISCELLANEOUS) IMPLANT
CONT SPEC 4OZ STRL OR WHT (MISCELLANEOUS) ×3
COVER SURGICAL LIGHT HANDLE (MISCELLANEOUS) ×3 IMPLANT
COVER WAND RF STERILE (DRAPES) IMPLANT
DRAPE LAPAROTOMY T 102X78X121 (DRAPES) ×3 IMPLANT
DRSG PAD ABDOMINAL 8X10 ST (GAUZE/BANDAGES/DRESSINGS) ×3 IMPLANT
ELECT REM PT RETURN 15FT ADLT (MISCELLANEOUS) ×3 IMPLANT
GAUZE 4X4 16PLY RFD (DISPOSABLE) ×3 IMPLANT
GAUZE SPONGE 4X4 12PLY STRL (GAUZE/BANDAGES/DRESSINGS) ×3 IMPLANT
GLOVE ECLIPSE 8.0 STRL XLNG CF (GLOVE) ×3 IMPLANT
GLOVE INDICATOR 8.0 STRL GRN (GLOVE) ×3 IMPLANT
GOWN STRL REUS W/TWL XL LVL3 (GOWN DISPOSABLE) ×6 IMPLANT
KIT BASIN (CUSTOM PROCEDURE TRAY) ×3 IMPLANT
KIT TURNOVER KIT A (KITS) IMPLANT
NDL SPNL 20GX3.5 QUINCKE YW (NEEDLE) IMPLANT
NEEDLE HYPO 22GX1.5 SAFETY (NEEDLE) ×3 IMPLANT
NEEDLE SPNL 20GX3.5 QUINCKE YW (NEEDLE) ×3 IMPLANT
PACK BASIC VI WITH GOWN DISP (CUSTOM PROCEDURE TRAY) ×3 IMPLANT
PENCIL SMOKE EVACUATOR (MISCELLANEOUS) ×2 IMPLANT
SUCTION FRAZIER HANDLE 12FR (TUBING)
SUCTION TUBE FRAZIER 12FR DISP (TUBING) IMPLANT
SURGILUBE 2OZ TUBE FLIPTOP (MISCELLANEOUS) ×3 IMPLANT
SUT CHROMIC 2 0 SH (SUTURE) IMPLANT
SUT CHROMIC 3 0 SH 27 (SUTURE) IMPLANT
SUT VIC AB 2-0 UR6 27 (SUTURE) IMPLANT
SWAB COLLECTION DEVICE MRSA (MISCELLANEOUS) IMPLANT
SWAB CULTURE ESWAB REG 1ML (MISCELLANEOUS) IMPLANT
SYR 20ML LL LF (SYRINGE) ×3 IMPLANT
SYR 3ML LL SCALE MARK (SYRINGE) IMPLANT
SYR BULB IRRIG 60ML STRL (SYRINGE) IMPLANT
SYR CONTROL 10ML LL (SYRINGE) ×4 IMPLANT
SYR TOOMEY IRRIG 70ML (MISCELLANEOUS) ×3
SYRINGE TOOMEY IRRIG 70ML (MISCELLANEOUS) IMPLANT
TOWEL OR 17X26 10 PK STRL BLUE (TOWEL DISPOSABLE) ×3 IMPLANT
TOWEL OR NON WOVEN STRL DISP B (DISPOSABLE) ×3 IMPLANT
YANKAUER SUCT BULB TIP 10FT TU (MISCELLANEOUS) ×3 IMPLANT

## 2020-05-13 NOTE — H&P (Signed)
Kathleen Flores Documented: 01/03/2020 9:15 AM Location: [facility] Patient #: [id number] DOB: 1946/06/24 Married / Language: Kathleen Flores / Race: White Female   History of Present Illness Kathleen Hector MD; 01/03/2020 10:02 AM) The patient is a 74 year old female who presents with colorectal cancer. Note for "Colorectal cancer": ` ` ` The patient returns s/p Loop ileostomy takedown 12/10/2019      The patient returns to clinic after surgery. She is using Citrucel for fiber which seems to help. About 3 doses a day. She still having some occasional incontinence but it got better managed. She is taking about 4-5 pills of Imodium a day. Seems like she has a lot of fecal urgency in the morning and then in the evening after dinner. Those of the worse times. She is using an appends. However it is definitely better from when I saw her 10 days ago. She is having about 8 bowel movements a day. She had a little bit of drainage at her old ileostomy incision for a day or 2 of some clear bloody fluid. It dried up. She feels that the swelling has gone down and the pain is less. Her appetite is good. She is eating a regular diet although she is trying to be careful.improving        Pathology: SURGICAL PATHOLOGY CASE: [id number] PATIENT: Kathleen Flores Surgical Pathology Report     Clinical History: Loop ileostomy in place for fecal diversion (cm)     FINAL MICROSCOPIC DIAGNOSIS:  A. ILEOSTOMY, LOOP: - Segment of small intestine with stoma     Kathleen Flores DESCRIPTION:  Received fresh is a segment of small intestine which consists of a double barrel ostomy. Each segment of intestine measures 4.5 cm in length. The ostomy site measures 3.5 cm in diameter and there is a rim of tan skin present. The mucosal surfaces are glistening and tan. Sections are submitted in 1 cassette. [facility] 12/11/2019)   Final Diagnosis performed by Kathleen Folds, MD.  Electronically signed 12/12/2019 Technical component performed at [facility], [place] [address], [place], [place] [address]. Professional component performed at [employer]. [facility], [place] [address] [place], [place] [address]. Immunohistochemistry Technical component (if applicable) was performed at [facility]. [address] [place], [place], [place] [address]. IMMUNOHISTOCHEMISTRY DISCLAIMER (if applicable): Some of these immunohistochemical stains may have been developed and the performance characteristics determine by [facility]. Some may not have been cleared or approved by the U.S. Food and Drug Administration. The FDA has determined that such clearance or approval is not necessary. This test is used for clinical purposes. It should not be regarded as investigational or for research. This laboratory is certified under the [place] (CLIA-88) as qualified to perform high complexity clinical laboratory testing. The controls stained appropriately. ` ` `  12/10/2019  3:17 PM  PATIENT: Kathleen Flores 74 y.o. female  Patient Care Team: Kathleen Flores, Kathleen Flores as PCP - General (Nurse Practitioner) Kathleen Boston, MD as Consulting Physician (General Surgery) Kathleen Banister, MD as Consulting Physician (Gastroenterology) Kathleen Flores, Dawn, RN (Inactive) as Oncology Nurse Navigator Kathleen Pier, MD as Consulting Physician (Oncology)  PRE-OPERATIVE DIAGNOSIS: LOOP ILEOSTOMY IN PLACE FOR FECAL DIVERSION  POST-OPERATIVE DIAGNOSIS: LOOP ILEOSTOMY IN PLACE FOR FECAL DIVERSION  PROCEDURE: TAKEDOWN OF LOOP ILEOSTOMY WITH RE-ANASTOMOSIS EXAM UNDER ANESTHESIA  SURGEON: Kathleen Hector, MD  ASSISTANT: Kathleen Flores  ANESTHESIA: local and general  EBL: Total I/O In: 1300 [  I.V.:1200; IV Piggyback:100] Out: 250 [Urine:200; Blood:50]  Delay start of Pharmacological VTE agent  (>24hrs) due to surgical blood loss or risk of bleeding: no  DRAINS: none  SPECIMEN: Loop ileostomy  DISPOSITION OF SPECIMEN: PATHOLOGY  COUNTS: YES  PLAN OF CARE: Admit to inpatient  PATIENT DISPOSITION: PACU - hemodynamically stable.  INDICATION: Pleasant patient status post low anterior resection with diverting loop ileostomy to protect the very distal anastomosis. The patient has recovered from that surgery with the anastomosis well-healed. It was felt safe to have the loop ileostomy taken down. I discussed the procedure with the patient:  The anatomy & physiology of the digestive tract was discussed. The pathophysiology was discussed. Possibility of remaining with an ostomy permanently was discussed. I offered ostomy takedown. Laparoscopic & open techniques were discussed.  Risks such as bleeding, infection, abscess, leak, reoperation, possible re-ostomy, injury to other organs, hernia, heart attack, death, and other risks were discussed. I noted a good likelihood this will help address the problem. Goals of post-operative recovery were discussed as well. We will work to minimize complications. Questions were answered. The patient expresses understanding & wishes to proceed with surgery.  OR FINDINGS: Side-to-side staple anastomosis Barcelona style of distal ileum. Normal anatomy.  DESCRIPTION:  Informed consent was confirmed. The patient received IV antibiotics & underwent general anesthesia without any difficulty. Foley catheter was sterilely placed. SCDs were active during the entire case. I did an examination under anesthesia transrectally. Sphincter tone intact and normal. I can feel the EEA anastomosis about 3 cm from the anal verge without any stricturing. No retained mucus in the neorectum. No stricture. Felt no significant abnormalities. I detected no contraindication with proceeding with ostomy takedown. The abdomen was prepped and draped in a  sterile fashion. A surgical timeout confirmed our plan.  I made a biconcave curvilinear incision transversely around the loop ileostomy as well as some chronically thickened ulcerated skin. I got into the subcutaneous tissues. I used careful focused right angle dissection and sharp dissection. Some focused cautery dissection as well. That helped to free adhesions to the subcutaneous tisses & fascia. Gradually, I was able to enter into the peritoneum focally. I did a gentle finger sweep. Gradually came around circumferentially and freed the loop of ileum from remaining adhesions to the abdominal wall. We were able eviscerate some bowel proximally and distally.   I did a side-to-side stapled anastomosis using a 75 GIA. Silk stitch placed at the crotch of the anastomosis. I used a TX-90 stapler to staple off the common defect and resect the remaining ileum, most of it that involve the intra-abdominal ostomy component given the adhesions. . Took the mesentery with clamps and silk ties. I closed the mesenteric defect using interrupted silk stitches. Using the redundant mesentery to help cover and protect the TX staple line. The anastomosis looked healthy and viable. We returned the anastomosis into the abdominal cavity. Finger sweep circumferentially noted no adhesions. It rested well.  We changed gloves and instruments. Irrigated copiously into the wound and fascia. I reapproximated the fascia transversely using #1 PDS running suture. I irrigated into the subcutaneous tissues. The wound seem to come together more obliquely. I did to a layer of interrupted 2-0 Vicryl deep dermal sutures to help close the wound and dead space down. I reapproximated the skin using 4-0 Monocryl stitch running centrally. I excised some skin and subcutaneous tissue at the corners to eliminate dog earring. I left the corners open. I packed the corners with anitbiotic  soaked umbilical tape for wicks. Sterile  dressing was applied.  Patient was extubated and is stable in the recovery room. I discussed operative findings, updated the patient's status, discussed probable steps to recovery, and gave postoperative recommendations to the patient's spouse, Gershon Mussel. Recommendations were made. Questions were answered. He expressed understanding & appreciation.   Kathleen Flores, M.D., F.A.C.S. Gastrointestinal and Minimally Invasive Surgery [facility] 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 Owens Shark, [place]; 01/03/2020 9:15 AM) Toprol XL *BETA BLOCKERS*  Swollen lips. Allergies Reconciled   Medication History (Tanisha A. Owens Shark, RMA; 01/03/2020 9:15 AM) Imodium A-D (2MG  Tablet, 1 (one) Oral four times daily, as needed, Taken starting 01/01/2020) Active. amLODIPine Besylate (Oral) Specific strength unknown - Active. Losartan Potassium (Oral) Specific strength unknown - Active. Synthroid (125MCG Tablet, Oral) Active. Vitamin C (Oral) Specific strength unknown - Active. Vitamin D3 (Oral) Specific strength unknown - Active. Calcium (Oral) Specific strength unknown - Active. Zinc (Oral) Specific  strength unknown - Active. Elderberry (Oral) Specific strength unknown - Active. Biotin (Oral) Specific strength unknown - Active. amLODIPine Besylate (5MG  Tablet, Oral) Active. ALPRAZolam (0.25MG  Tablet, Oral) Active. Medications Reconciled  Social History Kathleen Hector, MD; 01/03/2020 9:40 AM) Alcohol use  Moderate alcohol use. Caffeine use  Carbonated beverages. No drug use  Tobacco use  Former smoker.  Family History Kathleen Hector, MD; 01/03/2020 9:40 AM) Alcohol Abuse  Father. Cancer  Mother. Heart Disease  Father. Heart disease in female family member before age 2  Hypertension  Mother.  Pregnancy / Birth History Kathleen Hector, MD; 01/03/2020 9:40 AM) Age at menarche  86 years. Age of menopause  7-55 Gravida  1 Maternal age  3-25 Para  54  Other Problems Kathleen Hector, MD; 01/03/2020 9:40 AM) High blood pressure  Hypercholesterolemia  Lump In Breast  Oophorectomy  Bilateral. Thyroid Disease  ILEITIS (K52.9)   Vitals (Tanisha A. Brown RMA; 01/03/2020 9:16 AM) 01/03/2020 9:15 AM Weight: 124.6 lb Height: 67in Body Surface Area: 1.65 m Body Mass Index: 19.51 kg/m  Temp.: 77F  Pulse: 102 (Regular)  BP: 158/86(Sitting, Left Arm, Standard)       Physical Exam Kathleen Hector MD; 01/03/2020 9:57 AM) General Mental Status-Alert. General Appearance-Not in acute distress. Voice-Normal. Note: Alert. Well-groomed. Moving normally. Smiling. This is the best I have seen her.   Integumentary Global Assessment Upon inspection and palpation of skin surfaces of the - Distribution of scalp and body hair is normal. General Characteristics Overall examination of the patient's skin reveals - no rashes and no suspicious lesions.  Head and Neck Head-normocephalic, atraumatic with no lesions or palpable masses. Face Global Assessment - atraumatic, no absence of expression. Neck Global Assessment - no abnormal  movements, no decreased range of motion. Trachea-midline. Thyroid Gland Characteristics -  non-tender.  Eye Eyeball - Left-Extraocular movements intact, No Nystagmus - Left. Eyeball - Right-Extraocular movements intact, No Nystagmus - Right. Upper Eyelid - Left-No Cyanotic - Left. Upper Eyelid - Right-No Cyanotic - Right. Note: Wears glasses. Vision corrected   Chest and Lung Exam Inspection Accessory muscles - No use of accessory muscles in breathing.  Abdomen Note: 5x4cm right paramedian swelling consistent with postoperative hematoma. Smaller. Not painful. Incision closed. No cellulitis. No active bleeding. No cellulitis. No guarding/rebound tenderness   Rectal Note: Deferred for now   Peripheral Vascular Upper Extremity Inspection - Left - Not Gangrenous, No Petechiae. Inspection - Right - Not Gangrenous, No Petechiae.  Neurologic Neurologic evaluation reveals -normal attention span and ability to concentrate, able to name objects and repeat phrases. Appropriate fund of knowledge and normal coordination.  Neuropsychiatric Mental status exam performed with findings of-able to articulate well with normal speech/language, rate, volume and coherence and no evidence of hallucinations, delusions, obsessions or homicidal/suicidal ideation. Orientation-oriented X3.  Musculoskeletal Global Assessment Gait and Station - normal gait and station.  Lymphatic General Lymphatics Description - No Generalized lymphadenopathy.    Assessment & Plan Kathleen Hector MD; 01/03/2020 10:00 AM) S/P CLOSURE OF ILEOSTOMY [id number]) Impression: Diarrhea and overflow fecal incontinence - improved  Unfortunately not surprising in a patient had high output ileostomy issues  I strongly recommend she continue slow her bowels down. Increase loperamide to 6 doses a day (4mg  TID). Adjust for <3 BMs a day. Continue citrucel  Call 2 weeks with progress  Bruising and  swelling at her old ileostomy site most likely consistent with hematoma. No evidence of cellulitis or abscess. Nondraining. Hold off on drainage - resolving Current Plans The patient was instructed to call back in 2 weeks with progress Return to clinic as needed.  Soreness, decreased appetite, and poor energy level are common problems after surgery. While many people can struggle with a bad day, these concerns should gradually fade away or at least improve. Much of your recovery depends on your health & the severity of your operation. Please call if you have any further questions / concerns related to surgery.  Increase activity as tolerated to regular everyday activity. Consider daily low impact exercise every day such as walking an hour a day.  Do not push through pain. If it hurts to do it, then don't do it.  Diet as tolerated. Low fat high fiber diet ideal. 30 g fiber a day ideal. Consider taking a daily fiber supplement to keep your bowels regular.  Followup with your primary care physician for other health issues as would normally be done.  Consider screening for malignancies (breast, prostate, colon, melanoma, etc) as appropriate. Discuss with you primary care physician.  DIARRHEA, UNSPECIFIED TYPE (R19.7) Impression: As above.  Pelvic floor PT as needed if not better in 3 months Current Plans Pt Education - [facility] Free Text Education/Instructions Pt Education - [facility] Good Bowel Health (Jull Harral) RECTAL ADENOCARCINOMA (C20) Impression: Patient with bulky mid rectal cancer that required lower anterior rectosigmoid resection and diverting loop ileostomy.  Continue survivable pathway through medical oncology.  Follow-up colonoscopy per gastroenterology Current Plans Pt Education - [facility] Colorectal Cancer (AT): discussed with patient and provided information. Consider follow up colonoscopy by your gastroenterologist, depending on your diagnosis. Call your gastroenterologist for  advice  If you had a colon or rectal cancer resected by surgery, you should strongly consider getting a colonoscopy by your gastroenterologict one year after the colon cancer was removed. If it  was a benign polyp that was removed by colon resection, consider follow-up colonoscopy in about 3 years.  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, [place],  [address] [phone] Fax [phone] Main/Paging  CONTACT INFORMATION: Weekday (9AM-5PM) concerns: Call [facility] main office at [phone] Weeknight (5PM-9AM) or Weekend/Holiday concerns: Check [email or web address] for General Surgery [facility] coverage (Please, do not use SecureChat as it is not reliable communication to operating surgeons for immediate patient care)       Addendum:.  Patient with persistent irregular bowels and rectal urgency.  Colonoscopy revealed no proctitis but some abnormal granulation tissue with anastomosis and biopsy.  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  She is better on antibiotics but not resolved.  Discussed with my colorectal colleagues.  We feel patient will benefit from transanal opening of the anastomotic fistulization the presacral space.  Allowed to marsupialize and open up and granulate and heal in.  That should hopefully help with the intermittent rectal urgency and irregular bowels.  There is a risk of worsening continence.  There is a chance this anastomosis is nonsalvageable and she may need permanent ostomy.  Hopefully not too likely, but we will see.  Patient interested in proceeding with surgery.

## 2020-05-13 NOTE — Progress Notes (Signed)
Pt has had 3 episodes of a thin brown drainage from her rectum/vagina.  Dr Harlow Asa made aware, and stated this is expected.

## 2020-05-13 NOTE — Anesthesia Procedure Notes (Signed)
Procedure Name: LMA Insertion Date/Time: 05/13/2020 1:13 PM Performed by: Montel Clock, CRNA Pre-anesthesia Checklist: Patient identified, Emergency Drugs available, Suction available, Patient being monitored and Timeout performed Patient Re-evaluated:Patient Re-evaluated prior to induction Oxygen Delivery Method: Circle system utilized Preoxygenation: Pre-oxygenation with 100% oxygen Induction Type: IV induction LMA: LMA with gastric port inserted LMA Size: 4.0 Number of attempts: 1 Dental Injury: Teeth and Oropharynx as per pre-operative assessment
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
## 2020-05-13 NOTE — Op Note (Addendum)
05/13/2020  2:31 PM  PATIENT:  Kathleen Flores  74 y.o. female  Patient Care Team: [name], [place] as PCP - General (Nurse Practitioner) Michael Boston, MD as Consulting Physician (General Surgery) Milus Banister, MD as Consulting Physician (Gastroenterology) Ladell Pier, MD as Consulting Physician (Oncology)  PRE-OPERATIVE DIAGNOSIS:  Neorectopelvic fistula into presacral pelvis  POST-OPERATIVE DIAGNOSIS:  Neorectopelvic fistula into presacral pelvis  PROCEDURE: TRANSANAL MARSUPILIZATION OF NEORECTO-PELVIC FISTULA ANORECTAL EXAMINATION UNDER ANESTHESIA  SURGEON:  Adin Hector, MD  ASSISTANT: OR Staff   ANESTHESIA:   General Anorectal & Local field block (0.25% bupivacaine with epinephrine mixed with Liposomal bupivacaine (Experel)    EBL:  Total I/O In: 100 [IV Piggyback:100] Out: 20 [Blood:20].  See anesthesia record  Delay start of Pharmacological VTE agent (>24hrs) due to surgical blood loss or risk of bleeding:  no  DRAINS: none   SPECIMEN:  Aspirate  DISPOSITION OF SPECIMEN:  Microbiology  COUNTS:  YES  PLAN OF CARE: Admit for overnight observation  PATIENT DISPOSITION:  PACU - hemodynamically stable.  INDICATION: Patient with rectal cancer requiring neoadjuvant chemoradiation therapy, low anterior resection with side colon to and anal anastomosis and protective diverting loop ileostomy.  Ileostomy takedown 4 months later.  Struggled with intermittent diarrhea and crampiness.  Endoscopy noted abnormal area at: Anal anastomosis.  Biopsies negative for cancer recurrence.  CT scan concerning for gas and fluid pocket in presacral space connecting to the blind end of the neorectum coloanal anastomosis.  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  I recommended examination under anesthesia with probable unroofing and marsupialization of the chronic fistula to the presacral space.    The anatomy & physiology of the anorectal region was discussed.  We discussed the  pathophysiology of anorectal abscess and fistula.  Differential diagnosis was discussed.  Natural history progression was discussed.   I stressed the importance of a bowel regimen to have daily soft bowel movements to minimize progression of disease.     The patient's condition is not adequately controlled.  Non-operative treatment has not healed the fistula.  Therefore, I recommended examination under anaesthesia to confirm the diagnosis and treat the fistula.  I discussed techniques that may be required.  Benefits & alternatives discussed.  I noted a good likelihood this will help address the problem, but sometimes repeat operations and prolonged healing times may occur.  Risks such as bleeding, pain, recurrence, reoperation, incontinence, heart attack, death, and other risks were discussed.      Educational handouts further explaining the pathology, treatment options, and bowel regimen were given.  The patient expressed understanding & wishes to proceed.  We will work to coordinate surgery for a mutually convenient time.   OR FINDINGS: Patient had presacral fluid collection that appeared to connect with the distal end of the colon staple line/blind loop of the Baker style neorectal (colon) - anal anastomosis.    Mildly purulent but mostly serous fluid cavity on mid sacrum in the posterior midline presacral space of the mid/distal pelvis, 4-9 cm from anal verge.  Opened up with harmonic scalpel to create 5 x 2.5 cm neorectal/colon to presacral space opening/marsupialization  No rectovaginal fistula.  No abscess, perirectal fistula.  Intact sphincter.  No fissure   DESCRIPTION:   Informed consent was confirmed. Patient underwent general anesthesia without difficulty. Patient was placed into lithotomy positioning.  The perianal region was prepped and draped in sterile fashion. Surgical timeout confirmed or plan.  I did digital rectal examination and then transitioned over  to anoscopy to get a sense  of the anatomy.   Some chronic skin changes in the perianal region consistent with prior radiation.  Sphincter tone intact.  Fullness in the posterior midline presacral space.  The proximal end of the colon in the left anterior circumference.  Pseudo-J-pouch with distal blind loop of neorectal colon flipping proximally along the distal/mid presacral space.  After reconfirming with colonoscopic pictures as well as CT scan, I was able to find the presacral pocket with a spinal needle going through the distal blind loop pouch posterior wall into a presacral fluid collection.  I aspirated a few milliliters of mostly serous mildly purulent fluid.  I used a harmonic scalpel to go through the posterior neorectal colonic wall about 4 cm proximal to the anal verge and connect into the cavity.  I was able to gradually dilate and open up until I had a much more open wound.  I was able to probe and find the apex of this presacral cavity around 9 cm from the anal verge.  Elongated ellipsoid cavity space.  Fibrotic.  I encountered no more purulence  I reexamined the anal canal.   There is was no narrowing.  Hemostasis was good.  We placed fluff gauze to onlay over the perirectal region.  No internal packing done.  Patient is being extubated go to recovery room.  I discussed operative findings, updated the patient's status, discussed probable steps to recovery, and gave postoperative recommendations to the patient's spouse.  Recommendations were made.  Questions were answered.  He expressed understanding & appreciation.  Instructions are written as well.  Adin Hector, M.D., F.A.C.S. Gastrointestinal and Minimally Invasive Surgery [facility] [address] [place], [place] [address] [phone] Main / Paging

## 2020-05-13 NOTE — Transfer of Care (Signed)
Immediate Anesthesia Transfer of Care Note  Patient: Kathleen Flores  Procedure(s) Performed: MARSUPILIZATION OF NEO  RECTOPELVIC FISTULA, ANORECTAL EXAMINATION UNDER ANESTHESIA (N/A )  Patient Location: PACU  Anesthesia Type:General  Level of Consciousness: drowsy and patient cooperative  Airway & Oxygen Therapy: Patient Spontanous Breathing and Patient connected to face mask oxygen  Post-op Assessment: Report given to RN and Post -op Vital signs reviewed and stable  Post vital signs: Reviewed and stable  Last Vitals:  Vitals Value Taken Time  BP 162/64 05/13/20 1438  Temp    Pulse 91 05/13/20 1440  Resp 10 05/13/20 1441  SpO2 100 % 05/13/20 1440  Vitals shown include unvalidated device data.  Last Pain:  Vitals:   05/13/20 1151  TempSrc:   PainSc: 0-No pain         Complications: No apparent anesthesia complications

## 2020-05-13 NOTE — Anesthesia Preprocedure Evaluation (Signed)
Anesthesia Evaluation  Patient identified by MRN, date of birth, ID band Patient awake    Reviewed: Allergy & Precautions, NPO status , Patient's Chart, lab work & pertinent test results  Airway Mallampati: I       Dental no notable dental hx. (+) Teeth Intact   Pulmonary neg pulmonary ROS, former smoker,    Pulmonary exam normal breath sounds clear to auscultation       Cardiovascular hypertension, Normal cardiovascular exam Rhythm:Regular Rate:Normal     Neuro/Psych negative neurological ROS     GI/Hepatic negative GI ROS, Neg liver ROS,   Endo/Other  Hypothyroidism   Renal/GU negative Renal ROS  negative genitourinary   Musculoskeletal negative musculoskeletal ROS (+)   Abdominal Normal abdominal exam  (+)   Peds  Hematology  (+) anemia ,   Anesthesia Other Findings   Reproductive/Obstetrics                             Anesthesia Physical Anesthesia Plan  ASA: II  Anesthesia Plan: General   Post-op Pain Management:    Induction: Intravenous  PONV Risk Score and Plan: 3 and Ondansetron  Airway Management Planned: LMA  Additional Equipment:   Intra-op Plan:   Post-operative Plan:   Informed Consent: I have reviewed the patients History and Physical, chart, labs and discussed the procedure including the risks, benefits and alternatives for the proposed anesthesia with the patient or authorized representative who has indicated his/her understanding and acceptance.     Dental advisory given  Plan Discussed with: CRNA  Anesthesia Plan Comments:         Anesthesia Quick Evaluation

## 2020-05-14 ENCOUNTER — Encounter: Payer: Self-pay | Admitting: *Deleted

## 2020-05-14 ENCOUNTER — Telehealth: Payer: Self-pay | Admitting: Gastroenterology

## 2020-05-14 ENCOUNTER — Other Ambulatory Visit: Payer: Self-pay

## 2020-05-14 DIAGNOSIS — K604 Rectal fistula: Secondary | ICD-10-CM | POA: Diagnosis not present

## 2020-05-14 LAB — BASIC METABOLIC PANEL
Anion gap: 9 (ref 5–15)
BUN: 6 mg/dL — ABNORMAL LOW (ref 8–23)
CO2: 28 mmol/L (ref 22–32)
Calcium: 8.4 mg/dL — ABNORMAL LOW (ref 8.9–10.3)
Chloride: 102 mmol/L (ref 98–111)
Creatinine, Ser: 0.55 mg/dL (ref 0.44–1.00)
GFR calc Af Amer: >60 mL/min (ref >60)
GFR calc non Af Amer: >60 mL/min (ref >60)
Glucose, Bld: 130 mg/dL — ABNORMAL HIGH (ref 70–99)
Potassium: 3.4 mmol/L — ABNORMAL LOW (ref 3.5–5.1)
Sodium: 139 mmol/L (ref 135–145)

## 2020-05-14 LAB — CBC
HCT: 27.1 % — ABNORMAL LOW (ref 36.0–46.0)
Hemoglobin: 8.4 g/dL — ABNORMAL LOW (ref 12.0–15.0)
MCH: 27.5 pg (ref 26.0–34.0)
MCHC: 31 g/dL (ref 30.0–36.0)
MCV: 88.6 fL (ref 80.0–100.0)
Platelets: 252 10*3/uL (ref 150–400)
RBC: 3.06 MIL/uL — ABNORMAL LOW (ref 3.87–5.11)
RDW: 14.4 % (ref 11.5–15.5)
WBC: 4.8 10*3/uL (ref 4.0–10.5)
nRBC: 0 % (ref 0.0–0.2)

## 2020-05-14 LAB — MAGNESIUM: Magnesium: 2 mg/dL (ref 1.7–2.4)

## 2020-05-14 NOTE — Telephone Encounter (Signed)
She should stay on the [place] on scheduled basis.  She should take imodium PRN.    Thanks

## 2020-05-14 NOTE — Telephone Encounter (Signed)
Patient had transanal marsupilization of neorecto-pelvic fistula anorectal exam yesterday.  She would like to know your recommendations on how to continue imodium and Questran.    Please advise.

## 2020-05-14 NOTE — Plan of Care (Signed)
All discharge instructions were given to Pt. Pt verbalized understanding.

## 2020-05-14 NOTE — Anesthesia Postprocedure Evaluation (Signed)
Anesthesia Post Note  Patient: Kathleen Flores  Procedure(s) Performed: MARSUPILIZATION OF NEO  [place] FISTULA, ANORECTAL EXAMINATION UNDER ANESTHESIA (N/A )     Patient location during evaluation: PACU Anesthesia Type: General Level of consciousness: awake Pain management: pain level controlled Vital Signs Assessment: post-procedure vital signs reviewed and stable Respiratory status: spontaneous breathing Cardiovascular status: stable Postop Assessment: no apparent nausea or vomiting Anesthetic complications: no    Last Vitals:  Vitals:   05/14/20 0240 05/14/20 0521  BP: 132/69 (!) 121/52  Pulse: 63 65  Resp: 16 17  Temp: 36.8 C 36.9 C  SpO2: 100% 100%    Last Pain:  Vitals:   05/14/20 0745  TempSrc:   PainSc: 0-No pain   Pain Goal:                   Huston Foley
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
## 2020-05-14 NOTE — Discharge Instructions (Signed)
Complete amoxicillin/Augmentin antibiotics.  Hold off on resuming Macrobid after the Augmentin unless you are having symptoms of UTI in your primary care physician agrees.  Continue bowel regimen with antidiarrheals per Dr. Ardis Hughs and [facility] gastroenterology (cholestyramine 2-4 doses a day, fiber supplements such as flaxseed or psyllium 1-2 times a day, Imodium 4-8 pills a day) to help get your diarrhea under control.  Follow-up with Dr. Johney Maine in ~2 weeks to make sure you are improving.   ANORECTAL SURGERY:  POST OPERATIVE INSTRUCTIONS  ## ####################################################################  EAT Start with a pureed / full liquid diet After 24 hours, gradually transition to a high fiber diet.    CONTROL PAIN Control pain so you can tolerate bowel movements,  walk, sleep, tolerate sneezing/coughing, and go up/down stairs.   HAVE A BOWEL MOVEMENT DAILY Keep your bowels regular to avoid problems.   Taking a fiber supplement every day to keep bowels soft.   Try a laxative to override constipation. Use an antidairrheal to slow down diarrhea.   Call if not better after 2 tries  WALK Walk an hour a day.  Control your pain to do that.   CALL IF YOU HAVE PROBLEMS/CONCERNS Call if you are still struggling despite following these instructions. Call if you have concerns not answered by these instructions  ######################################################################    1. Take your usually prescribed home medications unless otherwise directed.  2. DIET: Follow a light bland diet & liquids the first 24 hours after arrival home, such as soup, liquids, starches, etc.  Be sure to drink plenty of fluids.  Quickly advance to a usual solid diet within a few days.  Avoid fast food or heavy meals as your are more likely to get nauseated or have irregular bowels.  A low-fat, high-fiber diet for the rest of your life is ideal.  3. PAIN CONTROL: a. Pain is best  controlled by a usual combination of three different methods TOGETHER: i. Ice/Heat ii. Over the counter pain medication iii. Prescription pain medication b. Expect swelling and discomfort in the anus/rectal area.  Warm water baths (30-60 minutes up to 6 times a day, especially after bowel meovements) will help. Use ice for the first few days to help decrease swelling and bruising, then switch to heat such as warm towels, sitz baths, warm baths, etc to help relax tight/sore spots and speed recovery.  Some people prefer to use ice alone, heat alone, alternating between ice & heat.  Experiment to what works for you.   c. It is helpful to take an over-the-counter pain medication continuously for the first few weeks.  Choose one of the following that works best for you: i. Naproxen (Aleve, etc)  Two 220mg  tabs twice a day ii. Ibuprofen (Advil, etc) Three 200mg  tabs four times a day (every meal & bedtime) iii. Acetaminophen (Tylenol, etc) 500-650mg  four times a day (every meal & bedtime) d. A  prescription for pain medication (such as oxycodone, hydrocodone, etc) should be given to you upon discharge.  Take your pain medication as prescribed.  i. If you are having problems/concerns with the prescription medicine (does not control pain, nausea, vomiting, rash, itching, etc), please call us [phone] to see if we need to switch you to a different pain medicine that will work better for you and/or control your side effect better. ii. If you need a refill on your pain medication, please contact your pharmacy.  They will contact our office to request authorization. Prescriptions will not be filled after  5 pm or on week-ends.  If can take up to 48 hours for it to be filled & ready so avoid waiting until you are down to thel ast pill. e. A topical cream (Dibucaine) or a prescription for a cream (such as diltiazem 2% gel) may be given to you.  Many people find relief with topical creams.  Some people find it  burns too much.  Experiment.  If it helps, use it.  If it burns, don't using it.  Use a Sitz Bath 4-8 times a day for relief   [employer] A sitz bath is a warm water bath taken in the sitting position that covers only the hips and buttocks. It may be used for either healing or hygiene purposes. Sitz baths are also used to relieve pain, itching, or muscle spasms. The water may contain medicine. Moist heat will help you heal and relax.  HOME CARE INSTRUCTIONS  Take 3 to 4 sitz baths a day. 1. Fill the bathtub half full with warm water. 2. Sit in the water and open the drain a little. 3. Turn on the warm water to keep the tub half full. Keep the water running constantly. 4. Soak in the water for 15 to 20 minutes. 5. After the sitz bath, pat the affected area dry first.   4. KEEP YOUR BOWELS REGULAR a. The goal is one soft bowel movement a day b. Avoid getting constipated.  Between the surgery and the pain medications, it is common to experience some constipation.  Increasing fluid intake and taking a fiber supplement (such as Metamucil, Citrucel, FiberCon, MiraLax, etc) 2-3 times a day regularly will usually help prevent this problem from occurring.  A mild laxative (prune juice, Milk of Magnesia, MiraLax, etc) should be taken according to package directions if there are no bowel movements after 48 hours. c. Watch out for diarrhea.  If you have many loose bowel movements, simplify your diet to bland foods & liquids for a few days.  Stop any stool softeners and decrease your fiber supplement.  Switching to mild anti-diarrheal medications (Kayopectate, Pepto Bismol) can help.  Can try an imodium/loperamide dose.  If this worsens or does not improve, please call us.  5. Wound Care  a. Remove your bandages with your first bowel movement, usually the day after surgery.  You may have packing if you had an abscess.  Let any packing or gauze fall come out.   b. Wear an absorbent pad or soft cotton balls  in your underwear as needed to catch any drainage and help keep the area  c. Keep the area clean and dry.  Bathe / shower every day.  Keep the area clean by showering / bathing over the incision / wound.   It is okay to soak an open wound to help wash it.  Consider using a squeeze bottle filled with warm water to gently wash the anal area.  Wet wipes or showers / gentle washing after bowel movements is often less traumatic than regular toilet paper. d. Dennis Bast will often notice bleeding with bowel movements.  This should slow down by the end of the first week of surgery.  Sitting on an ice pack can help. e. Expect some drainage.  This should slow down by the end of the first week of surgery, but you will have occasional bleeding or drainage up to a few months after surgery.  Wear an absorbent pad or soft cotton gauze in your underwear until the drainage  stops.  6. ACTIVITIES as tolerated:   a. You may resume regular (light) daily activities beginning the next day--such as daily self-care, walking, climbing stairs--gradually increasing activities as tolerated.  If you can walk 30 minutes without difficulty, it is safe to try more intense activity such as jogging, treadmill, bicycling, low-impact aerobics, swimming, etc. b. Save the most intensive and strenuous activity for last such as sit-ups, heavy lifting, contact sports, etc  Refrain from any heavy lifting or straining until you are off narcotics for pain control.   c. DO NOT PUSH THROUGH PAIN.  Let pain be your guide: If it hurts to do something, don't do it.  Pain is your body warning you to avoid that activity for another week until the pain goes down. d. You may drive when you are no longer taking prescription pain medication, you can comfortably sit for long periods of time, and you can safely maneuver your car and apply brakes. e. Dennis Bast may have sexual intercourse when it is comfortable.  7. FOLLOW UP in our office a. Please call [facility] at [phone]  to set up an appointment to see your surgeon in the office for a follow-up appointment approximately 2-3 weeks after your surgery. b. Make sure that you call for this appointment the day you arrive home to ensure a convenient appointment time.  8. IF YOU HAVE DISABILITY OR FAMILY LEAVE FORMS, BRING THEM TO THE OFFICE FOR PROCESSING.  DO NOT GIVE THEM TO YOUR DOCTOR.        WHEN TO CALL us [phone]: 1. Poor pain control 2. Reactions / problems with new medications (rash/itching, nausea, etc)  3. Fever over 101.5 F (38.5 C) 4. Inability to urinate 5. Nausea and/or vomiting 6. Worsening swelling or bruising 7. Continued bleeding from incision. 8. Increased pain, redness, or drainage from the incision  The clinic staff is available to answer your questions during regular business hours (8:30am-5pm).  Please don't hesitate to call and ask to speak to one of our nurses for clinical concerns.   A surgeon from [facility] is always on call at the hospitals   If you have a medical emergency, go to the nearest emergency room or call 911.    [facility], [address], [place], [place], [place]  [address] ? MAIN: [phone] ? TOLL FREE: [phone] ? FAX [phone] [email or web address]   Pelvic floor muscle training exercises ("Kegels") can help strengthen the muscles under the uterus, bladder, and bowel (large intestine). They can help both men and women who have problems with urine leakage or bowel control.  A pelvic floor muscle training exercise is like pretending that you have to urinate, and then holding it. You relax and tighten the muscles that control urine flow. It's important to find the right muscles to tighten.  The next time you have to urinate, start to go and then stop. Feel the muscles in your vagina, bladder, or anus get tight and move up. These are the pelvic floor muscles. If you feel them tighten, you've  done the exercise right. If you are still not sure whether you are tightening the right muscles, keep in mind that all of the muscles of the pelvic floor relax and contract at the same time. Because these muscles control the bladder, rectum, and vagina, the following tips may help: Women: Insert a finger into your vagina. Tighten the muscles as if you are holding in your urine, then let go.  You should feel the muscles tighten and move up and down.  Men: Insert a finger into your rectum. Tighten the muscles as if you are holding in your urine, then let go. You should feel the muscles tighten and move up and down. These are the same muscles you would tighten if you were trying to prevent yourself from passing gas.  It is very important that you keep the following muscles relaxed while doing pelvic floor muscle training exercises: Abdominal  Buttocks (the deeper, anal sphincter muscle should contract)  Thigh   A woman can also strengthen these muscles by using a vaginal cone, which is a weighted device that is inserted into the vagina. Then you try to tighten the pelvic floor muscles to hold the device in place. If you are unsure whether you are doing the pelvic floor muscle training correctly, you can use biofeedback and electrical stimulation to help find the correct muscle group to work. Biofeedback is a method of positive reinforcement. Electrodes are placed on the abdomen and along the anal area. Some therapists place a sensor in the vagina in women or anus in men to monitor the contraction of pelvic floor muscles.  A monitor will display a graph showing which muscles are contracting and which are at rest. The therapist can help find the right muscles for performing pelvic floor muscle training exercises.   PERFORMING PELVIC FLOOR EXERCISES: 1. Begin by emptying your bladder. 2. Tighten the pelvic floor muscles and hold for a count of 10. 3. Relax the muscles completely for a count of 10. 4. Do  10 repititions, 3 to 5 times a day (morning, afternoon, and night). You can do these exercises at any time and any place. Most people prefer to do the exercises while lying down or sitting in a chair. After 4 - 6 weeks, most people notice some improvement. It may take as long as 3 months to see a major change. After a couple of weeks, you can also try doing a single pelvic floor contraction at times when you are likely to leak (for example, while getting out of a chair). A word of caution: Some people feel that they can speed up the progress by increasing the number of repetitions and the frequency of exercises. However, over-exercising can instead cause muscle fatigue and increase urine leakage. If you feel any discomfort in your abdomen or back while doing these exercises, you are probably doing them wrong. Breathe deeply and relax your body when you are doing these exercises. Make sure you are not tightening your stomach, thigh, buttock, or chest muscles. When done the right way, pelvic floor muscle exercises have been shown to be very effective at improving urinary continence.  Pelvic Floor Pain / Incontinence  Do you suffer from pelvic pain or incontinence? Do you have pain in the pelvis, low back or hips that is associated with sitting, walking, urination or intercourse? Have you experienced leaking of urine or feces when coughing, sneezing or laughing? Do you have pain in the pelvic area associated with cancer?  These are conditions that are common with pelvic floor muscle dysfunction. Over time, due to stress, scar tissue, surgeries and the natural course of aging, our muscles may become weak or overstressed and can spasm. This can lead to pain, weakness, incontinence or decreased quality of life.  Men and women with pelvic floor dysfunction frequently describe:  A "falling out" feeling. Pain or burning in the abdomen, tailbone or perineal area. Constipation or  bowel elimination problems or  difficulty initiating urination. Unresolved low back or hip pain. Frequency and urgency when going to the bathroom. Leaking of urine or feces. Pain with intercourse.  [email or web address]  To make a referral or for more information about [facility], call  [facility]) - [phone] Linna Hoff [phone] [place]) - [phone] [place] ([place]) - [place].  ######################################################################  EAT Gradually transition to a high fiber diet with a fiber supplement over the next few weeks after discharge.  Start with a pureed / full liquid diet (see below)  WALK Walk an hour a day.  Control your pain to do that.    HAVE A BOWEL MOVEMENT DAILY Keep your bowels regular to avoid problems.  OK to try a laxative to override constipation.  OK to use an antidairrheal to slow down diarrhea.  Call if not better after 2 tries  CALL IF YOU HAVE PROBLEMS/CONCERNS Call if you are still struggling despite following these instructions. Call if you have concerns not answered by these instructions  ######################################################################   Irregular bowel habits such as constipation and diarrhea can lead to many problems over time.  Having one soft bowel movement a day is the most important way to prevent further problems.  The anorectal canal is designed to handle stretching and feces to safely manage our ability to get rid of solid waste (feces, poop, stool) out of our body.  BUT, hard constipated stools can act like ripping concrete bricks and diarrhea can be a burning fire to this very sensitive area of our body, causing inflamed hemorrhoids, anal fissures, increasing risk is perirectal abscesses, abdominal pain/bloating, an making  irritable bowel worse.      The goal: ONE SOFT BOWEL MOVEMENT A DAY!  To have soft, regular bowel movements:  . Drink plenty of fluids, consider 4-6 tall glasses of water a day.   . Take plenty of fiber.  Fiber is the undigested part of plant food that passes into the colon, acting s "natures broom" to encourage bowel motility and movement.  Fiber can absorb and hold large amounts of water. This results in a larger, bulkier stool, which is soft and easier to pass. Work gradually over several weeks up to 6 servings a day of fiber (25g a day even more if needed) in the form of: o Vegetables -- Root (potatoes, carrots, turnips), leafy green (lettuce, salad greens, celery, spinach), or cooked high residue (cabbage, broccoli, etc) o Fruit -- Fresh (unpeeled skin & pulp), Dried (prunes, apricots, cherries, etc ),  or stewed ( applesauce)  o Whole grain breads, pasta, etc (whole wheat)  o Bran cereals  . Bulking Agents -- This type of water-retaining fiber generally is easily obtained each day by one of the following:  o Psyllium bran -- The psyllium plant is remarkable because its ground seeds can retain so much water. This product is available as Metamucil, Konsyl, Effersyllium, Per Diem Fiber, or the less expensive generic preparation in drug and health food stores. Although labeled a laxative, it really is not a laxative.  o Methylcellulose -- This is another fiber derived from wood which also retains water. It is available as Citrucel. o Polyethylene Glycol - and "artificial" fiber commonly called Miralax or Glycolax.  It is helpful for people with gassy or bloated feelings with regular fiber o Flax Seed - a less gassy fiber than psyllium . No reading or other relaxing activity while on the  toilet. If bowel movements take longer than 5 minutes, you are too constipated . AVOID CONSTIPATION.  High fiber and water intake usually takes care of this.  Sometimes a laxative is needed to stimulate more frequent  bowel movements, but  . Laxatives are not a good long-term solution as it can wear the colon out.  They can help jump-start bowels if constipated, but should be relied on constantly without discussing with your doctor o Osmotics (Milk of Magnesia, Fleets phosphosoda, Magnesium citrate, MiraLax, GoLytely) are safer than  o Stimulants (Senokot, Castor Oil, Dulcolax, Ex Lax)    o Avoid taking laxatives for more than 7 days in a row. .  IF SEVERELY CONSTIPATED, try a Bowel Retraining Program: o Do not use laxatives.  o Eat a diet high in roughage, such as bran cereals and leafy vegetables.  o Drink six (6) ounces of prune or apricot juice each morning.  o Eat two (2) large servings of stewed fruit each day.  o Take one (1) heaping tablespoon of a psyllium-based bulking agent twice a day. Use sugar-free sweetener when possible to avoid excessive calories.  o Eat a normal breakfast.  o Set aside 15 minutes after breakfast to sit on the toilet, but do not strain to have a bowel movement.  o If you do not have a bowel movement by the third day, use an enema and repeat the above steps.  . Controlling diarrhea o Switch to liquids and simpler foods for a few days to avoid stressing your intestines further. o Avoid dairy products (especially milk & ice cream) for a short time.  The intestines often can lose the ability to digest lactose when stressed. o Avoid foods that cause gassiness or bloating.  Typical foods include beans and other legumes, cabbage, broccoli, and dairy foods.  Every person has some sensitivity to other foods, so listen to our body and avoid those foods that trigger problems for you. o Adding fiber (Citrucel, Metamucil, psyllium, Miralax) gradually can help thicken stools by absorbing excess fluid and retrain the intestines to act more normally.  Slowly increase the dose over a few weeks.  Too much fiber too soon can backfire and cause cramping & bloating. o Probiotics (such as active  yogurt, Align, etc) may help repopulate the intestines and colon with normal bacteria and calm down a sensitive digestive tract.  Most studies show it to be of mild help, though, and such products can be costly. o Medicines: - Bismuth subsalicylate (ex. Kayopectate, Pepto Bismol) every 30 minutes for up to 6 doses can help control diarrhea.  Avoid if pregnant. - Loperamide (Immodium) can slow down diarrhea.  Start with two tablets (4mg  total) first and then try one tablet every 6 hours.  Avoid if you are having fevers or severe pain.  If you are not better or start feeling worse, stop all medicines and call your doctor for advice o Call your doctor if you are getting worse or not better.  Sometimes further testing (cultures, endoscopy, X-ray studies, bloodwork, etc) may be needed to help diagnose and treat the cause of the diarrhea.  TROUBLESHOOTING IRREGULAR BOWELS 1) Avoid extremes of bowel movements (no bad constipation/diarrhea) 2) Miralax 17gm mixed in 8oz. water or juice-daily. May use BID as needed.  3) Gas-x,Phazyme, etc. as needed for gas & bloating.  4) Soft,bland diet. No spicy,greasy,fried foods.  5) Prilosec over-the-counter as needed  6) May hold gluten/wheat products from diet to see if symptoms improve.  7)  May try probiotics (Align, Activa, etc) to help calm the bowels down 7) If symptoms become worse call back immediately.

## 2020-05-14 NOTE — Telephone Encounter (Signed)
Patient aware that she should take Questran 4 gram packs- 2 packs in the morning and 1 pack in the evening. Patient advised to continue taking imodium on an as needed basis.  Patient agreed to plan and verbalized understanding.  No further questions.

## 2020-05-18 LAB — AEROBIC/ANAEROBIC CULTURE W GRAM STAIN (SURGICAL/DEEP WOUND)

## 2020-05-21 ENCOUNTER — Telehealth: Payer: Self-pay

## 2020-05-21 NOTE — Telephone Encounter (Signed)
Spoke with patient regarding recommendations and expected symptoms, pt advised to increase Imodium, and to decrease fiber supplements/dietary fiber, pt states that she had issues with fiber in the past and wondered if that could be what is causing her symptoms. Pt states that she will proceed with recommendations until appointment with Dr. Ardis Hughs on 06/06/20 at 2:30 pm to see if this will help. Pt advised that once symptoms are controlled that you all will revisit pelvic floor PT, pt stated that she has already made an appointment. Patient wanted to thank you both.

## 2020-05-21 NOTE — Telephone Encounter (Signed)
-----   Message from Milus Banister, MD sent at 05/21/2020  8:11 AM EDT ----- Regarding: RE: Pt calling with questions Patty, Can you call her and ask her to try to cut back on her fiber supplement, dietary fiber as well.  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  She needs office visit with me in 4 or 5 weeks to discuss how she is doing.  Thanks   Ardine Bjork, thanks.  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  Pt is calling to report, she is having fecal incontinence and stool coming from her vagina. Pt inquiring if this is normal a week after surgery? She is "worried and it would ease my mind if Dr Johney Maine will say this is to be expected. I am frustrated. I don't want to go another 5 months with these symptoms."   Pt is having small amounts of semi-soft stool frequently, will wake up and have loose stool in her pad. Pt is taking 6 tabs of Imodium a day. Questran 2 packs BID, Citrucel in the afternoon. Pt is trying to eating high fiber  diet.  Thx  Abigail Butts

## 2020-05-28 ENCOUNTER — Telehealth: Payer: Self-pay | Admitting: Gastroenterology

## 2020-05-28 NOTE — Telephone Encounter (Signed)
Pt reported that Questran is causing epigastric pain.

## 2020-05-29 NOTE — Telephone Encounter (Signed)
The pt spoke with Dr Ardis Hughs over the phone last night and was given instructions.  She states she was advised to go to the ED but she began to have multiple bowel movements and feels much better.  FYI Dr Ardis Hughs

## 2020-06-06 ENCOUNTER — Ambulatory Visit: Payer: PPO | Admitting: Gastroenterology

## 2020-06-06 ENCOUNTER — Encounter: Payer: Self-pay | Admitting: Gastroenterology

## 2020-06-06 VITALS — BP 140/60 | HR 98 | Ht 66.0 in | Wt 122.2 lb

## 2020-06-06 DIAGNOSIS — Z85038 Personal history of other malignant neoplasm of large intestine: Secondary | ICD-10-CM

## 2020-06-06 NOTE — Patient Instructions (Signed)
If you are age 73 or older, your body mass index should be between 23-30. Your Body mass index is 19.73 kg/m. If this is out of the aforementioned range listed, please consider follow up with your Primary Care Provider.  If you are age 88 or younger, your body mass index should be between 19-25. Your Body mass index is 19.73 kg/m. If this is out of the aformentioned range listed, please consider follow up with your Primary Care Provider.   You will follow up with our office in 6 months (December 2021) or before if needed.  Thank you for entrusting me with your care and choosing [facility].  Dr Ardis Hughs
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
## 2020-06-12 DIAGNOSIS — N390 Urinary tract infection, site not specified: Secondary | ICD-10-CM | POA: Diagnosis not present

## 2020-06-25 ENCOUNTER — Ambulatory Visit: Payer: PPO | Attending: Surgery | Admitting: Physical Therapy

## 2020-06-25 ENCOUNTER — Other Ambulatory Visit: Payer: Self-pay

## 2020-06-25 ENCOUNTER — Encounter: Payer: Self-pay | Admitting: Physical Therapy

## 2020-06-25 DIAGNOSIS — C20 Malignant neoplasm of rectum: Secondary | ICD-10-CM | POA: Diagnosis not present

## 2020-06-25 DIAGNOSIS — R279 Unspecified lack of coordination: Secondary | ICD-10-CM | POA: Insufficient documentation

## 2020-06-25 DIAGNOSIS — M25652 Stiffness of left hip, not elsewhere classified: Secondary | ICD-10-CM | POA: Diagnosis not present

## 2020-06-25 DIAGNOSIS — R159 Full incontinence of feces: Secondary | ICD-10-CM | POA: Insufficient documentation

## 2020-06-25 DIAGNOSIS — M25651 Stiffness of right hip, not elsewhere classified: Secondary | ICD-10-CM | POA: Insufficient documentation

## 2020-06-25 DIAGNOSIS — M6281 Muscle weakness (generalized): Secondary | ICD-10-CM | POA: Diagnosis not present

## 2020-06-25 NOTE — Patient Instructions (Addendum)
Slow Contraction: Gravity Eliminated (Side-Lying)    Lie on  side, hips and knees slightly bent. Slowly squeeze pelvic floor for _5__ seconds. Rest for _10__ seconds. Repeat _5__ times. Do _4__ times a day.  No buttocks contraction Copyright  VHI. All rights reserved.  While laying on side massage around the bone by the rectum to loosen up the muscles.   [facility] [address] [place], [place] [address] Phone # [phone] Fax [phone]
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
## 2020-07-01 DIAGNOSIS — R197 Diarrhea, unspecified: Secondary | ICD-10-CM | POA: Diagnosis not present

## 2020-07-01 DIAGNOSIS — K9189 Other postprocedural complications and disorders of digestive system: Secondary | ICD-10-CM | POA: Diagnosis not present

## 2020-07-01 DIAGNOSIS — F411 Generalized anxiety disorder: Secondary | ICD-10-CM | POA: Diagnosis not present

## 2020-07-01 DIAGNOSIS — R198 Other specified symptoms and signs involving the digestive system and abdomen: Secondary | ICD-10-CM | POA: Diagnosis not present

## 2020-07-01 DIAGNOSIS — K913 Postprocedural intestinal obstruction, unspecified as to partial versus complete: Secondary | ICD-10-CM | POA: Diagnosis not present

## 2020-07-01 DIAGNOSIS — C20 Malignant neoplasm of rectum: Secondary | ICD-10-CM | POA: Diagnosis not present

## 2020-07-01 DIAGNOSIS — L290 Pruritus ani: Secondary | ICD-10-CM | POA: Diagnosis not present

## 2020-07-03 ENCOUNTER — Other Ambulatory Visit: Payer: Self-pay

## 2020-07-03 ENCOUNTER — Ambulatory Visit: Payer: PPO | Admitting: Physical Therapy

## 2020-07-03 DIAGNOSIS — M25652 Stiffness of left hip, not elsewhere classified: Secondary | ICD-10-CM

## 2020-07-03 DIAGNOSIS — M25651 Stiffness of right hip, not elsewhere classified: Secondary | ICD-10-CM

## 2020-07-03 DIAGNOSIS — C20 Malignant neoplasm of rectum: Secondary | ICD-10-CM

## 2020-07-03 DIAGNOSIS — M6281 Muscle weakness (generalized): Secondary | ICD-10-CM

## 2020-07-03 DIAGNOSIS — R279 Unspecified lack of coordination: Secondary | ICD-10-CM

## 2020-07-03 DIAGNOSIS — R159 Full incontinence of feces: Secondary | ICD-10-CM
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
## 2020-07-03 NOTE — Patient Instructions (Addendum)
About Abdominal Massage  Abdominal massage, also called external colon massage, is a self-treatment circular massage technique that can reduce and eliminate gas and ease constipation. The colon naturally contracts in waves in a clockwise direction starting from inside the right hip, moving up toward the ribs, across the belly, and down inside the left hip.  When you perform circular abdominal massage, you help stimulate your colon's normal wave pattern of movement called peristalsis.  It is most beneficial when done after eating.  Positioning You can practice abdominal massage with oil while lying down, or in the shower with soap.  Some people find that it is just as effective to do the massage through clothing while sitting or standing.  How to Massage Start by placing your finger tips or knuckles on your right side, just inside your hip bone.  . Make small circular movements while you move upward toward your rib cage.   . Once you reach the bottom right side of your rib cage, take your circular movements across to the left side of the bottom of your rib cage.  . Next, move downward until you reach the inside of your left hip bone.  This is the path your feces travel in your colon. . Continue to perform your abdominal massage in this pattern for 10 minutes each day.     You can apply as much pressure as is comfortable in your massage.  Start gently and build pressure as you continue to practice.  Notice any areas of pain as you massage; areas of slight pain may be relieved as you massage, but if you have areas of significant or intense pain, consult with your healthcare provider.  Other Considerations . General physical activity including bending and stretching can have a beneficial massage-like effect on the colon.  Deep breathing can also stimulate the colon because breathing deeply activates the same nervous system that supplies the colon.   . Abdominal massage should always be used in  combination with a bowel-conscious diet that is high in the proper type of fiber for you, fluids (primarily water), and a regular exercise program. Hook-Lying    Lie with hips and knees bent. Allow body's muscles to relax. Place hands on belly. Inhale slowly and deeply for __5_ seconds, so hands move up. Then take _5__ seconds to exhale. Repeat __5_ times. Do _5__ times a day.   Copyright  VHI. All rights reserved.  Kathleen Flores [address] [place], [place] [address] Phone # [phone] Fax [phone]

## 2020-07-28 ENCOUNTER — Other Ambulatory Visit: Payer: Self-pay | Admitting: Gastroenterology

## 2020-07-28 ENCOUNTER — Other Ambulatory Visit: Payer: Self-pay

## 2020-07-28 MED ORDER — CHOLESTYRAMINE 4 G PO PACK: 4.0000 g | PACK | Freq: Every day | ORAL | 3 refills | Status: DC

## 2020-08-05 IMAGING — MR MR PELVIS WO/W CM
4 of 6 series · 21 of 48 positions shown · IV contrast (gadavist)
Comparison: CT on 06/22/2019

CLINICAL DATA: Rectal carcinoma. Previous APR and radiation
therapy, with ulcer at anastomosis.

EXAM:
MRI ABDOMEN AND PELVIS WITHOUT AND WITH CONTRAST
TECHNIQUE: Multiplanar multisequence MR imaging of the abdomen and pelvis was
performed both before and after the administration of intravenous
contrast.
CONTRAST:  5mL GADAVIST GADOBUTROL 1 MMOL/ML IV SOLN

[Series 2: T2 · coronal · 6.0mm · 1.56mm/px · 4 of 30 slices shown (1 of 3)]
[im 1/30]
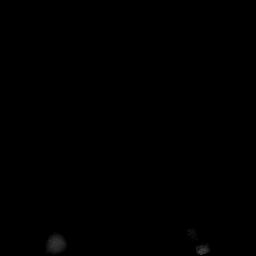
[im 10/30]
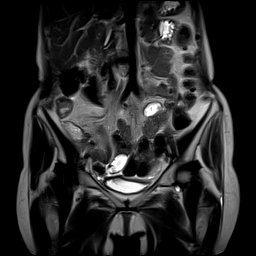
[im 20/30]
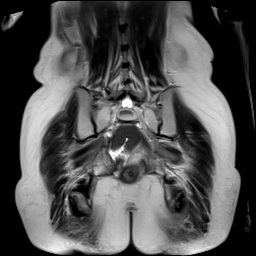
[im 30/30]
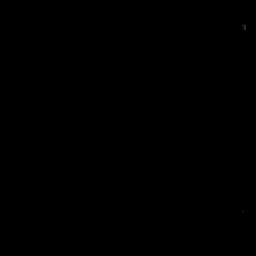

[Series 3: T2 · axial · 5.0mm · 0.51mm/px · z∈[-166,+62]mm · 7 of 39 slices shown (2 of 3)]
[im 1/39]
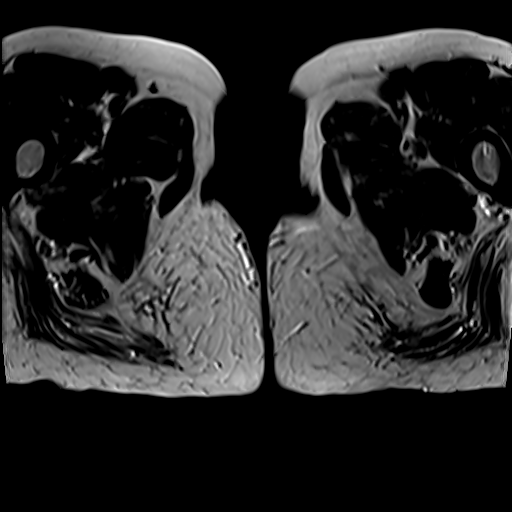
[im 7/39]
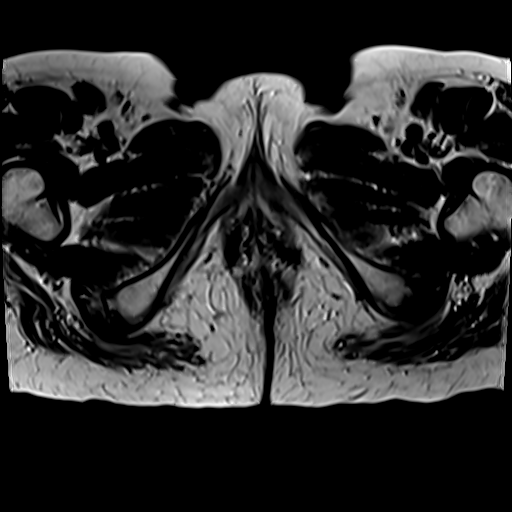
[im 13/39]
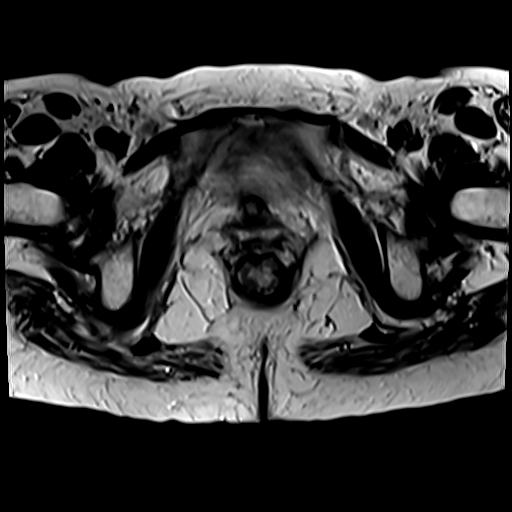
[im 20/39]
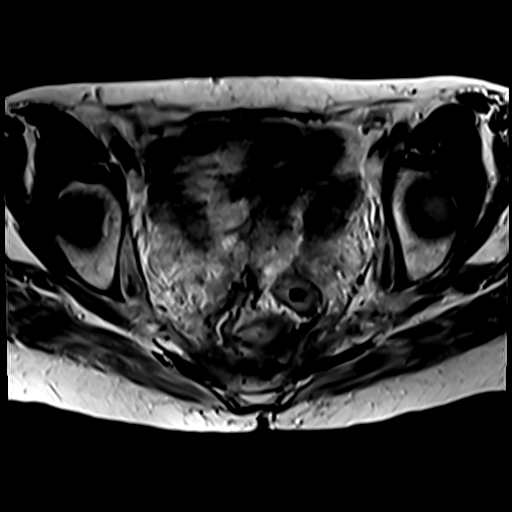
[im 26/39]
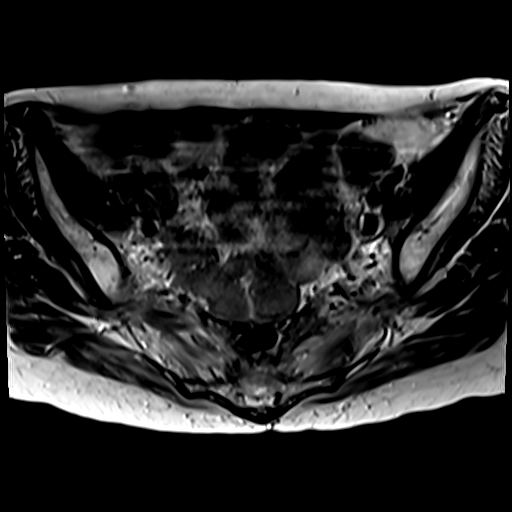
[im 32/39]
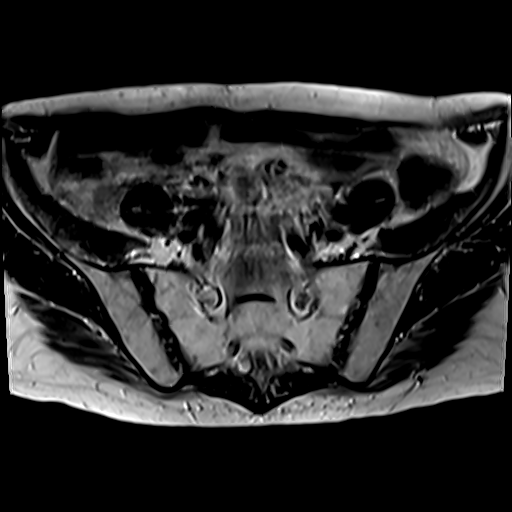
[im 39/39]
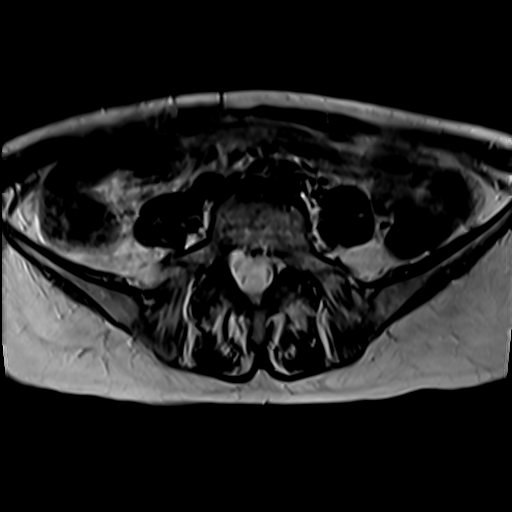

[Series 4: T2 fat-sat · axial · 5.0mm · 0.51mm/px · z∈[-151,+47]mm · 6 of 34 slices shown]
[im 1/34]
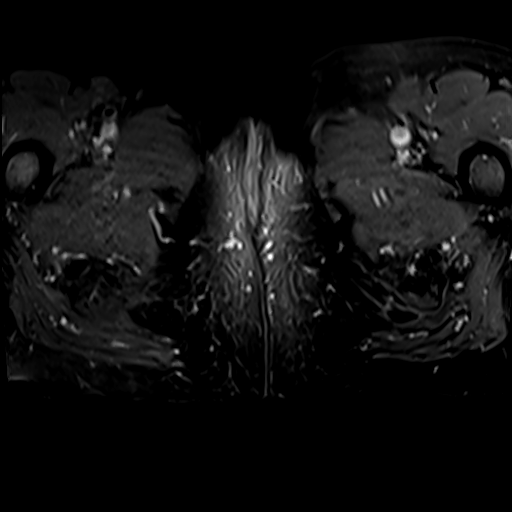
[im 7/34]
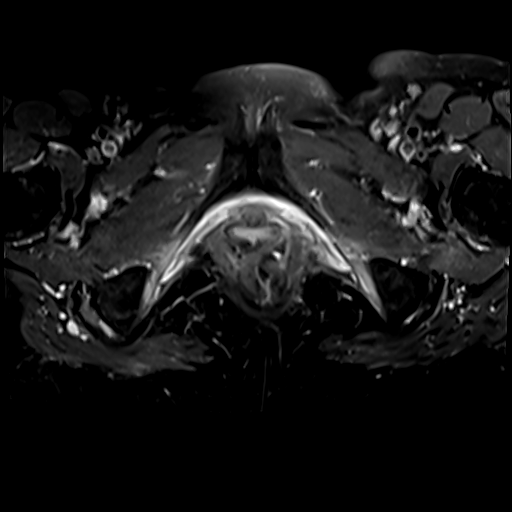
[im 14/34]
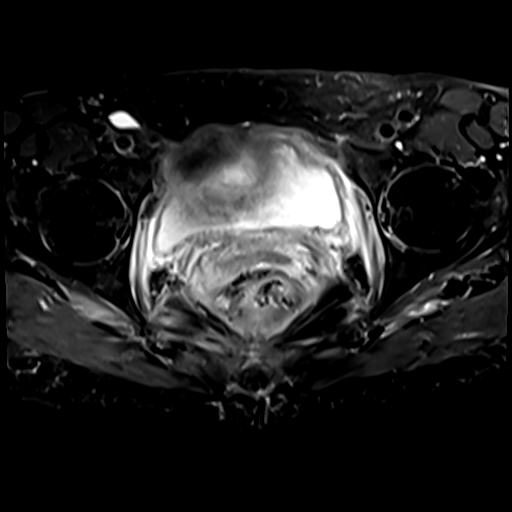
[im 20/34]
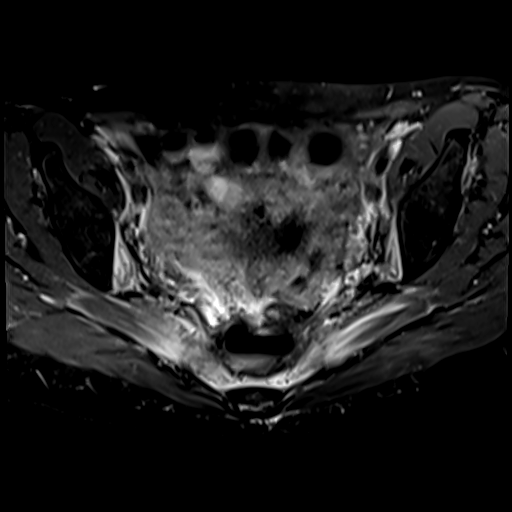
[im 27/34]
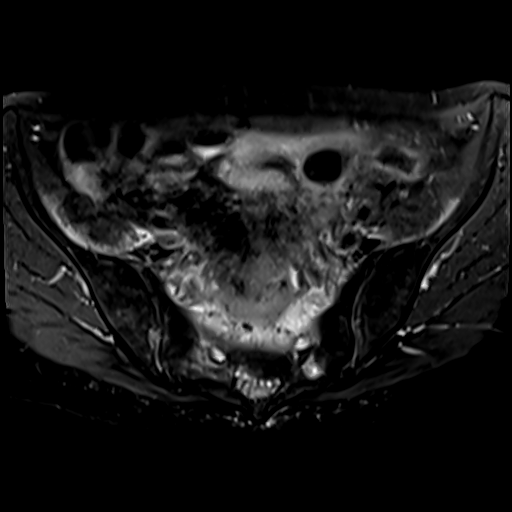
[im 34/34]
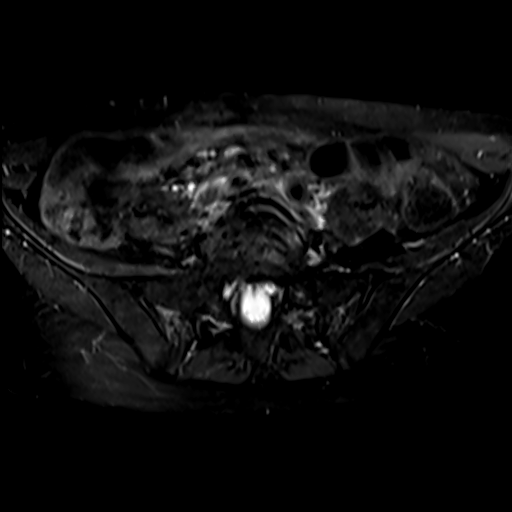

[Series 5: T2 · sagittal · 5.0mm · 0.55mm/px · 4 of 40 slices shown (3 of 3)]
[im 1/40]
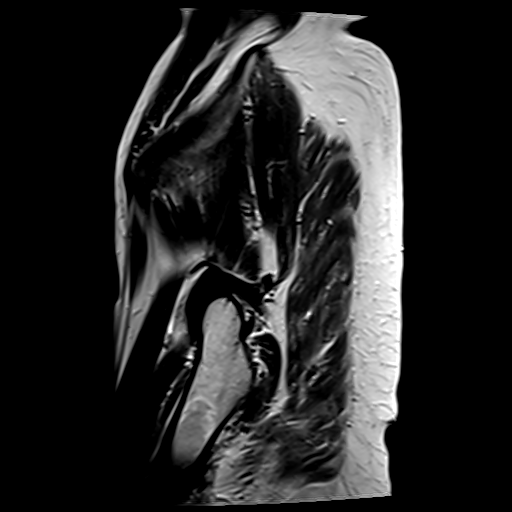
[im 7/40]
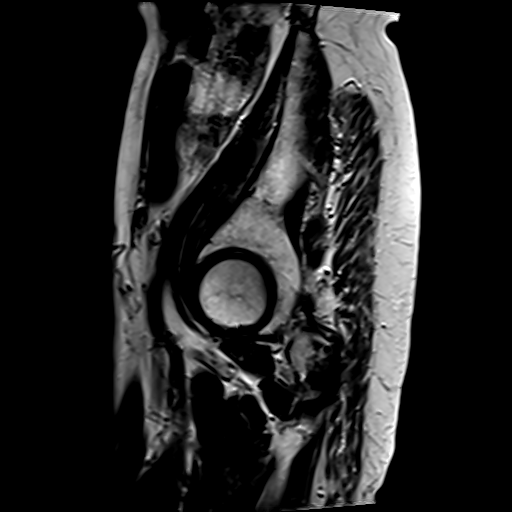
[im 20/40]
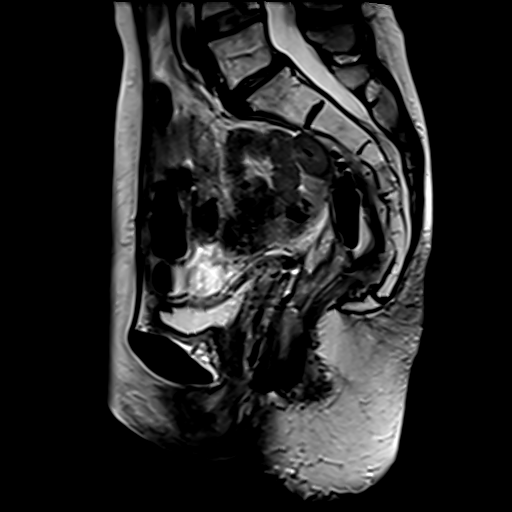
[im 33/40]
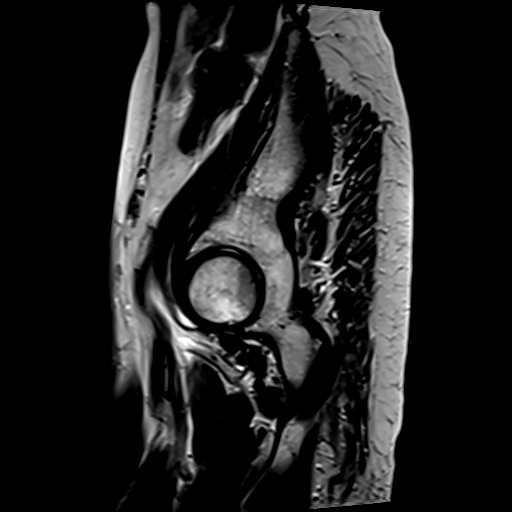

[21 of 48 positions shown; findings below may reference images not displayed]

FINDINGS: COMBINED FINDINGS FOR BOTH MR ABDOMEN AND PELVIS

Lower Chest: No acute findings.

Hepatobiliary: No hepatic masses identified. Gallbladder is
unremarkable. No evidence of biliary ductal dilatation.

Pancreas:  No mass or inflammatory changes.

Spleen: Within normal limits in size and appearance.

Adrenals/Urinary Tract: No masses identified. No evidence of
ureteral calculi or hydronephrosis.

Stomach/Bowel: Diffuse rectal wall thickening is seen, as well as
wall thickening involving pelvic small bowel loops, likely due to
previous radiation therapy. Ill-defined presacral and perirectal
soft tissue density has increased since previous study, also likely
due to post treatment changes. This limits evaluation, however no
discrete mass identified.

A fistula is seen arising from the right lateral wall of the mid
rectum (image [DATE]). A multiloculated fluid and gas collection is
seen within the posterior perirectal and presacral space. In
aggregate, this collection measures 9.0 x 1.7 x 4.1 cm.

Vascular/Lymphatic: No pathologically enlarged lymph nodes. No
abdominal aortic aneurysm.

Reproductive: Prior hysterectomy. No mass or other significant
abnormality.

Other:  None.

Musculoskeletal:  No suspicious bone lesions identified.
IMPRESSION: Ill-defined presacral and perirectal soft tissue density has
increased since previous study, likely due to post treatment
changes. This limits evaluation, however no discrete mass
identified.

Rectal fistula with large posterior perirectal and presacral fluid
and gas collection. Abscess cannot be excluded.

No evidence of metastatic disease.

## 2020-08-08 ENCOUNTER — Encounter: Payer: Self-pay | Admitting: Physical Therapy

## 2020-08-08 ENCOUNTER — Other Ambulatory Visit: Payer: Self-pay

## 2020-08-08 ENCOUNTER — Ambulatory Visit: Payer: PPO | Attending: Surgery | Admitting: Physical Therapy

## 2020-08-08 DIAGNOSIS — R159 Full incontinence of feces: Secondary | ICD-10-CM | POA: Diagnosis not present

## 2020-08-08 DIAGNOSIS — M6281 Muscle weakness (generalized): Secondary | ICD-10-CM | POA: Diagnosis not present

## 2020-08-08 DIAGNOSIS — R279 Unspecified lack of coordination: Secondary | ICD-10-CM | POA: Insufficient documentation

## 2020-08-08 DIAGNOSIS — C20 Malignant neoplasm of rectum: Secondary | ICD-10-CM | POA: Diagnosis not present

## 2020-08-08 NOTE — Patient Instructions (Signed)
Access Code: [email or web address] URL: [email or web address] Date: 08/08/2020 Prepared by: Earlie Counts  Exercises Prone Hip Extension with Pillow Under Abdomen - 1 x daily - 7 x weekly - 2 sets - 10 reps Supine Diaphragmatic Breathing with Pelvic Floor Lengthening - 3 x daily - 7 x weekly - 10 reps - 1 sets [facility] [address] [place], [place] [address] Phone # [phone] Fax [phone]
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
## 2020-08-14 ENCOUNTER — Other Ambulatory Visit: Payer: Self-pay

## 2020-08-14 ENCOUNTER — Encounter: Payer: Self-pay | Admitting: Physical Therapy

## 2020-08-14 ENCOUNTER — Ambulatory Visit: Payer: PPO | Attending: Surgery | Admitting: Physical Therapy

## 2020-08-14 DIAGNOSIS — R159 Full incontinence of feces: Secondary | ICD-10-CM | POA: Insufficient documentation

## 2020-08-14 DIAGNOSIS — C20 Malignant neoplasm of rectum: Secondary | ICD-10-CM | POA: Diagnosis not present

## 2020-08-14 DIAGNOSIS — M6281 Muscle weakness (generalized): Secondary | ICD-10-CM | POA: Insufficient documentation

## 2020-08-14 DIAGNOSIS — R279 Unspecified lack of coordination: Secondary | ICD-10-CM | POA: Insufficient documentation
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
## 2020-08-14 NOTE — Patient Instructions (Signed)
Access Code: [email or web address] URL: [email or web address] Date: 08/14/2020 Prepared by: Earlie Counts  Exercises Prone Hip Extension with Pillow Under Abdomen - 1 x daily - 7 x weekly - 2 sets - 10 reps Sidelying Pelvic Floor Contraction with Self-Palpation - 2 x daily - 7 x weekly - 1 sets - 10 reps - 5 sec hold Supine Diaphragmatic Breathing with Pelvic Floor Lengthening - 3 x daily - 7 x weekly - 10 reps - 1 sets [facility] [address] [place], [place] [address] Phone # [phone] Fax [phone]

## 2020-08-20 ENCOUNTER — Telehealth: Payer: Self-pay | Admitting: Gastroenterology

## 2020-08-20 DIAGNOSIS — R197 Diarrhea, unspecified: Secondary | ICD-10-CM

## 2020-08-20 NOTE — Telephone Encounter (Signed)
The pt has been advised and will come in and pick up stool testing kit order entered

## 2020-08-20 NOTE — Telephone Encounter (Signed)
The pt has a history of rectal cancer and abscess and fistula.  She had surgery on 6/15 of this year and states she has progressed well until recently.  She has developed diarrhea 5-6 times daily while on cholestyramine 4 g daily and 2 imodium three times daily.  She has been asked to call the surgeon that performed her surgery to make them aware.  She is concerned that she may have issues in her rectum again.  She has also been scheduled to see Nevin Bloodgood on 10/8 (soonest appt with anyone).  She will call back if the surgeons office thinks she needs to see our office sooner.  FYI Dr Ardis Hughs any further recommendations; do you want stool studies?

## 2020-08-20 NOTE — Telephone Encounter (Signed)
Yes, a GI pathogen panel.  Thank you

## 2020-08-21 ENCOUNTER — Other Ambulatory Visit: Payer: PPO

## 2020-08-21 ENCOUNTER — Encounter: Payer: Self-pay | Admitting: Physical Therapy

## 2020-08-21 ENCOUNTER — Other Ambulatory Visit: Payer: Self-pay

## 2020-08-21 ENCOUNTER — Ambulatory Visit: Payer: PPO | Admitting: Physical Therapy

## 2020-08-21 DIAGNOSIS — R197 Diarrhea, unspecified: Secondary | ICD-10-CM | POA: Diagnosis not present

## 2020-08-21 DIAGNOSIS — R159 Full incontinence of feces: Secondary | ICD-10-CM

## 2020-08-21 DIAGNOSIS — M6281 Muscle weakness (generalized): Secondary | ICD-10-CM | POA: Diagnosis not present

## 2020-08-21 DIAGNOSIS — C20 Malignant neoplasm of rectum: Secondary | ICD-10-CM

## 2020-08-21 DIAGNOSIS — R279 Unspecified lack of coordination: Secondary | ICD-10-CM
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
## 2020-08-21 NOTE — Patient Instructions (Signed)
Access Code: [email or web address] URL: [email or web address] Date: 08/21/2020 Prepared by: Earlie Counts  Exercises  Supine Hip Adduction Isometric with Diona Foley - 1 x daily - 7 x weekly - 1 sets - 10 reps - 5 sec hold Supine Bridge with Mini Swiss Ball Between Knees - 1 x daily - 7 x weekly - 10 reps - 2 sets Tall Kneeling Hip Hinge - 1 x daily - 7 x weekly - 1 sets - 10 reps Quadruped Alternating Arm Lift - 1 x daily - 7 x weekly - 1 sets - 10 reps  [facility] [address], [place], [place] [address] Phone # [phone] Fax [phone]

## 2020-08-25 DIAGNOSIS — K913 Postprocedural intestinal obstruction, unspecified as to partial versus complete: Secondary | ICD-10-CM | POA: Diagnosis not present

## 2020-08-25 DIAGNOSIS — R152 Fecal urgency: Secondary | ICD-10-CM | POA: Diagnosis not present

## 2020-08-25 DIAGNOSIS — R159 Full incontinence of feces: Secondary | ICD-10-CM | POA: Diagnosis not present

## 2020-08-25 DIAGNOSIS — F411 Generalized anxiety disorder: Secondary | ICD-10-CM | POA: Diagnosis not present

## 2020-08-25 DIAGNOSIS — L290 Pruritus ani: Secondary | ICD-10-CM | POA: Diagnosis not present

## 2020-08-25 DIAGNOSIS — C20 Malignant neoplasm of rectum: Secondary | ICD-10-CM | POA: Diagnosis not present

## 2020-08-25 DIAGNOSIS — K580 Irritable bowel syndrome with diarrhea: Secondary | ICD-10-CM | POA: Diagnosis not present

## 2020-08-25 DIAGNOSIS — K9189 Other postprocedural complications and disorders of digestive system: Secondary | ICD-10-CM | POA: Diagnosis not present

## 2020-08-25 DIAGNOSIS — R198 Other specified symptoms and signs involving the digestive system and abdomen: Secondary | ICD-10-CM | POA: Diagnosis not present

## 2020-08-26 LAB — GI PROFILE, STOOL, PCR
Adenovirus F 40/41: NOT DETECTED
Astrovirus: NOT DETECTED
C difficile toxin A/B: NOT DETECTED
Campylobacter: NOT DETECTED
Cryptosporidium: NOT DETECTED
Cyclospora cayetanensis: NOT DETECTED
E coli O157: NOT DETECTED
Entamoeba histolytica: NOT DETECTED
Enteroaggregative E coli: NOT DETECTED
Enteropathogenic E coli: NOT DETECTED
Enterotoxigenic E coli: NOT DETECTED
Giardia lamblia: NOT DETECTED
Norovirus GI/GII: NOT DETECTED
Plesiomonas shigelloides: NOT DETECTED
Rotavirus A: NOT DETECTED
Salmonella: NOT DETECTED
Sapovirus: NOT DETECTED
Shiga-toxin-producing E coli: NOT DETECTED
Shigella/Enteroinvasive E coli: NOT DETECTED
Vibrio cholerae: NOT DETECTED
Vibrio: NOT DETECTED
Yersinia enterocolitica: NOT DETECTED

## 2020-08-27 ENCOUNTER — Telehealth: Payer: Self-pay | Admitting: Nurse Practitioner

## 2020-08-27 DIAGNOSIS — R1013 Epigastric pain: Secondary | ICD-10-CM

## 2020-08-27 MED ORDER — HYOSCYAMINE SULFATE 0.125 MG SL SUBL: 0.1250 mg | SUBLINGUAL_TABLET | Freq: Four times a day (QID) | SUBLINGUAL | 3 refills | Status: DC | PRN

## 2020-08-27 NOTE — Telephone Encounter (Signed)
The pt has an appt on 10/5 with [facility] for continued diarrhea.  She is calling today stating that she had an episode of esophageal spasm and vomiting on Monday. She states this happened several months ago and called the on call who was Dr Ardis Hughs and he advised her to go to the ED.  She did not go however.  She has been told that if she has this pain again she should go to urgent care or ED for evaluation.  I also will send to Dr Ardis Hughs for any recommendations prior to the 10/5 appt with [facility].  Dr Ardis Hughs do you want to send hyosciamine?? Please advise.

## 2020-08-27 NOTE — Telephone Encounter (Signed)
Pt is requesting a call back from a nurse to discuss an episode she experienced Monday, epigastric pain and vomiting.

## 2020-08-27 NOTE — Telephone Encounter (Signed)
The pt has been advised and prescription sent to the pharmacy  

## 2020-08-27 NOTE — Telephone Encounter (Signed)
Yes, I think a prescription for 15/10.125 milligram tabs.  She should take 1-2 tabs every 6 hours as needed, dispense 60 with 3 refills.   Please also let her know that her GI pathogen panel is completely negative.  Thank you

## 2020-08-28 ENCOUNTER — Other Ambulatory Visit: Payer: Self-pay

## 2020-08-28 ENCOUNTER — Ambulatory Visit: Payer: PPO | Admitting: Physical Therapy

## 2020-08-28 ENCOUNTER — Encounter: Payer: Self-pay | Admitting: Physical Therapy

## 2020-08-28 DIAGNOSIS — C20 Malignant neoplasm of rectum: Secondary | ICD-10-CM

## 2020-08-28 DIAGNOSIS — R159 Full incontinence of feces: Secondary | ICD-10-CM

## 2020-08-28 DIAGNOSIS — R279 Unspecified lack of coordination: Secondary | ICD-10-CM

## 2020-08-28 DIAGNOSIS — M6281 Muscle weakness (generalized): Secondary | ICD-10-CM

## 2020-08-28 NOTE — Patient Instructions (Addendum)
Toileting Techniques for Bowel Movements    An Evacuation/Defecation Plan   Here are the 4 basic points:  1. Lean forward enough for your elbows to rest on your knees 2. Support your feet on the floor or use a low stool if your feet don't touch the floor  3. Push out your belly as if you have swallowed a beach ball--you should feel a widening of your waist. "Belly Big, Belly Hard" 4. Open and relax your pelvic floor muscles, rather than tightening around the anus  While you are sitting on the toilet pay attention to the following areas: . Jaw and mouth position- relaxed not clenched . Angle of your hips - leaning slightly forward . Whether your feet touch the ground or not - should be flat and supported . Arm placement - rest against your thighs . Spine position - flat back . Waist . Breathing - exhale as you push (like blowing up a balloon or try using other sounds such as ahhhh, shhhhh, ohhhh or grrrrrrr) . Belly - hard and tight as you push . Anus (opening of the anal canal) - relaxed and open as you push . Anus - Tighten and lift pulling the muscle back in after you are done or if taking a break  If you are not successful after 10-15 minutes, try again later.  Avoid negative self-talk about your toileting experience.   Read this for more details and ask your PT if you need suggestions for adjustments or limitations:  1) Sitting on the toilet  a) Make sure your feet are supported - flat on the floor or step stool b) Many people find it effective to lean forward or raise their knees.  Propping your feet on a step stool (Squatty Potty is a brand name) can help the muscles around the anus to relax  c) When you lean forward, place your forearms on your thighs for support  2) Relaxing a) Breathe deeply and slowly in through your nose and out through your mouth. b) To become aware of how to relax your muscles, contracting and releasing muscles can be helpful.  Pull your pelvic floor  muscles in tightly by using the image of holding back gas, or closing around the anus (visualize making a circle smaller) and lifting the anus up and in.  Then release the muscles and your anus should drop down and feel open. Repeat 5 times ending with the feeling of relaxation. c) Keep your pelvic floor muscles relaxed; let your belly bulge out. d) The digestive tract starts at the mouth and ends at the anal opening, so be sure to relax both ends of the tube.  Place your tongue on the roof of your mouth with your teeth separated.  This helps relax your mouth and will help to relax the anus at the same time.  3) Emptying (defecation) a) Keep your pelvic floor and sphincter relaxed, then bulge your anal muscles.  Make the anal opening wide.  b) Stick your belly out as if you have swallowed a beach ball. c) Make your belly wall hard using your belly muscles while continuing to breathe. Doing this makes it easier to open your anus. d) Breath out and give a grunt (or try using other sounds such as ahhhh, shhhhh, ohhhh or grrrrrrr). e)  Can also try to act as if you are blowing up a balloon as you push  4) Finishing a) As you finish your bowel movement, pull the pelvic floor muscles   up and in.  This will leave your anus in the proper place rather than remaining pushed out and down. If you leave your anus pushed out and down, it will start to feel as though that is normal and give you incorrect signals about needing to have a bowel movement. [facility] [address], [address] [place], [place] [address] Phone # [phone] Fax [phone]  
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
## 2020-09-02 ENCOUNTER — Other Ambulatory Visit: Payer: Self-pay | Admitting: Surgery

## 2020-09-02 ENCOUNTER — Other Ambulatory Visit (HOSPITAL_COMMUNITY): Payer: Self-pay | Admitting: Surgery

## 2020-09-02 DIAGNOSIS — C20 Malignant neoplasm of rectum: Secondary | ICD-10-CM

## 2020-09-03 ENCOUNTER — Other Ambulatory Visit: Payer: Self-pay

## 2020-09-03 ENCOUNTER — Ambulatory Visit (HOSPITAL_COMMUNITY)
Admission: RE | Admit: 2020-09-03 | Discharge: 2020-09-03 | Disposition: A | Discharge status: Home / Self Care | Payer: PPO | Source: Ambulatory Visit | Attending: Surgery | Admitting: Surgery

## 2020-09-03 DIAGNOSIS — I7090 Unspecified atherosclerosis: Secondary | ICD-10-CM | POA: Diagnosis not present

## 2020-09-03 DIAGNOSIS — C20 Malignant neoplasm of rectum: Secondary | ICD-10-CM | POA: Diagnosis not present

## 2020-09-03 DIAGNOSIS — N823 Fistula of vagina to large intestine: Secondary | ICD-10-CM | POA: Diagnosis not present

## 2020-09-04 ENCOUNTER — Encounter: Payer: PPO | Admitting: Physical Therapy

## 2020-09-09 ENCOUNTER — Ambulatory Visit: Payer: Self-pay | Admitting: Surgery

## 2020-09-16 ENCOUNTER — Other Ambulatory Visit (INDEPENDENT_AMBULATORY_CARE_PROVIDER_SITE_OTHER): Payer: PPO

## 2020-09-16 ENCOUNTER — Ambulatory Visit: Payer: PPO | Admitting: Nurse Practitioner

## 2020-09-16 ENCOUNTER — Encounter: Payer: Self-pay | Admitting: Nurse Practitioner

## 2020-09-16 VITALS — BP 172/88 | HR 111 | Ht 66.0 in | Wt 126.0 lb

## 2020-09-16 DIAGNOSIS — R1013 Epigastric pain: Secondary | ICD-10-CM

## 2020-09-16 LAB — HEPATIC FUNCTION PANEL
ALT: 10 U/L (ref 0–35)
AST: 16 U/L (ref 0–37)
Albumin: 4.2 g/dL (ref 3.5–5.2)
Alkaline Phosphatase: 76 U/L (ref 39–117)
Bilirubin, Direct: 0.1 mg/dL (ref 0.0–0.3)
Total Bilirubin: 0.4 mg/dL (ref 0.2–1.2)
Total Protein: 7.7 g/dL (ref 6.0–8.3)

## 2020-09-16 LAB — LIPASE: Lipase: 13 U/L (ref 11.0–59.0)
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
## 2020-09-16 NOTE — Patient Instructions (Addendum)
If you are age 73 or older, your body mass index should be between 23-30. Your Body mass index is 20.34 kg/m. If this is out of the aforementioned range listed, please consider follow up with your Primary Care Provider.  If you are age 63 or younger, your body mass index should be between 19-25. Your Body mass index is 20.34 kg/m. If this is out of the aformentioned range listed, please consider follow up with your Primary Care Provider.   Your provider has requested that you go to the basement level for lab work before leaving today. Press "B" on the elevator. The lab is located at the first door on the left as you exit the elevator.  Continue Hyoscyamine.

## 2020-09-17 ENCOUNTER — Ambulatory Visit: Payer: Self-pay | Admitting: Surgery

## 2020-09-17 NOTE — H&P (Signed)
Kathleen Flores Appointment: 08/25/2020 11:30 AM Location: [facility] Patient #: [id number] DOB: 05/27/1946 Married / Language: [name] / Race: White Female   History of Present Illness Adin Hector MD; 08/25/2020 12:47 PM) The patient is a 74 year old female who presents with colorectal cancer. 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 She's having diarrhea again and wished to discuss with me. She is due to see gastrology next month. She notes that she is moving her bowels 6-8 times a day. She's had overflow fecal incontinence accidents. Twice when she was driving active. She can walk an hour a day without any accidents. She tends to wear diaper at night because she noticed some leaking or soiling.  She takes Imodium. She told me she is taking 4 pills a day. Sometimes up to the 6. She has not maxed out. She is not on any Lomotil. She is not taking any fiber supplements since she feels like she cannot tolerate any of them. She is taking cholestyramine once in the morning. She gets epigastric discomforts it does not like to take in more than one a day. She is working with pelvic floor physical therapy and feels like she is slowly improving there. She denies any rectal bleeding. She does note some burning and skin irritation with the frequent loose bowel movements. She tried a Calmoseptine topical ointment with some help. Noticed some burning so stopped. She is eating a regular diet and has gained 5 pounds back. She is walking an hour a day.    PRIOR NOTE JULY 2021 Patient comes in by herself. She is feeling better. She is down to 3 Imodium a day and as needed. Questran twice a day. However, she is still having bowel movements 5-6 times a day. Somewhat loose at the end. Sounds like she  has to have 2 or 3 bowel movements to evacuate. Still has some leaking. However no severe rash. Still wears a pad. She worried she may have some vaginal drainage but it is hard to tell. That is improving actually as well. She is starting pelvic physical therapy and seems to tolerate that well. While she is disappointed she's not off all medications and not totally normal, she does feeling she is getting better. She's having more good days now. She is feeling more optimistic. 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 She misses eating vegetables though and is wondering if she can try that again. She denies having bloating or diarrhea with that.   PRIOR NOTE June 2021: Patient returns. She had been struggling with fecal urgency and loose bowel movements with some occasional overflow incontinence. Workup by GI. Trying to get her bowels under better control. Had CAT scan that showed concern for a small bowel gas pocket in the presacral space suspicious for fistula between her neorectum colon and the presacral space. I took her to the operating room and unroofed/opened this area at the blind end of her neorectum going into the presacral space transanally for some marsupialization. She grew out pansensitive Enterobacter. She went home postoperative day 1. Had recommended some fiber and Imodium. GI and had her on Questran as well to help sort things out. She has been concerned, calling GI and surgery often for advice to troubleshoot her  bowels.  She comes in today by herself. She completed antibiotics. She thinks that has helped her bowels down. She is taking 8 doses of Imodium day. 4 doses of Questran a day. That said. No fiber supplement. She notes that she will have about 6 bowel movements in the morning. Small infrequent and urgent. Then she will have a looser bowel movement in the  evening. Overall getting better. However disappointing. She feels like she is leaking and wears a pad just in case. She is worried that she may be draining out her vagina. However she is not having leaking all day. Just urgency and leaking around the time of straining to bowels. She is urinating okay. She feels like she has a rash around the anus. She has been using a diaper rash cream and Vaseline together in the hopes that will help things out. She is not taking any narcotics.                FLUID PRE SACRAL SAROMA COLLECTION Performed at [facility], [place] [address], [place], [place] [address] Special Requests NONE Performed at [facility], [place] [address] [place], [place] [address] Gram Stain ABUNDANT WBC PRESENT, PREDOMINANTLY PMN NO ORGANISMS SEEN Culture RARE ENTEROBACTER CLOACAE NO ANAEROBES ISOLATED Performed at [facility], [place] [address] [place], [place] [address] Report Status 05/18/2020 FINAL Organism ID, Bacteria ENTEROBACTER CLOACAE Resulting Agency [facility] <[email or web address]> Susceptibility Enterobacter cloacae MIC CEFAZOLIN >=64 RESIST... Resistant CEFEPIME <=1 SENSITIVE Sensitive CEFTAZIDIME 2 SENSITIVE Sensitive CEFTRIAXONE <=1 SENSITIVE Sensitive CIPROFLOXACIN <=0.25 SENS... Sensitive GENTAMICIN <=1 SENSITIVE Sensitive IMIPENEM <=0.25 SENS... Sensitive PIP/TAZO 16 SENSITIVE Sensitive TRIMETH/SULFA <=20 SENSIT... Sensitive      05/13/2020  2:31 PM  PATIENT: Kathleen Flores 74 y.o. female  Patient Care Team: [name], [place] as PCP - General (Nurse Practitioner) Michael Boston, MD as Consulting Physician (General Surgery) Milus Banister, MD as Consulting Physician (Gastroenterology) Ladell Pier, MD as Consulting Physician (Oncology)  PRE-OPERATIVE DIAGNOSIS: Neorectopelvic fistula into presacral pelvis  POST-OPERATIVE DIAGNOSIS:  Neorectopelvic fistula into presacral pelvis  PROCEDURE: TRANSANAL MARSUPILIZATION OF NEORECTO-PELVIC FISTULA ANORECTAL EXAMINATION UNDER ANESTHESIA  SURGEON: Adin Hector, MD  ASSISTANT: OR Staff  ANESTHESIA:  General Anorectal & Local field block (0.25% bupivacaine with epinephrine mixed with Liposomal bupivacaine (Experel)   EBL: Total I/O In: 100 [IV Piggyback:100] Out: 20 [Blood:20]. See anesthesia record  Delay start of Pharmacological VTE agent (>24hrs) due to surgical blood loss or risk of bleeding: no  DRAINS: none  SPECIMEN: Aspirate  DISPOSITION OF SPECIMEN: Microbiology  COUNTS: YES  PLAN OF CARE: Admit for overnight observation  PATIENT DISPOSITION: PACU - hemodynamically stable.  INDICATION: Patient with rectal cancer requiring neoadjuvant chemoradiation therapy, low anterior resection with side colon to and anal anastomosis and protective diverting loop ileostomy. Ileostomy takedown 4 months later. Struggled with intermittent diarrhea and crampiness. Endoscopy noted abnormal area at: Anal anastomosis. Biopsies negative for cancer recurrence. CT scan concerning for gas and fluid pocket in presacral space connecting to the blind end of the neorectum coloanal anastomosis. 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 I recommended examination under anesthesia with probable unroofing and marsupialization of the chronic fistula to the presacral space.   The anatomy & physiology of the anorectal region was discussed. We discussed the pathophysiology of anorectal abscess and fistula. Differential diagnosis was discussed. Natural history progression was discussed. I stressed the importance of a bowel regimen to have daily soft bowel movements to minimize progression of disease.  The patient's condition is not adequately controlled. Non-operative treatment has not healed the fistula. Therefore, I recommended examination under anaesthesia to confirm the  diagnosis and treat the fistula. I discussed techniques that may be required. Benefits & alternatives discussed. I noted a good likelihood this will help address the problem, but sometimes repeat operations and prolonged healing times may occur. Risks such as bleeding, pain, recurrence, reoperation, incontinence, heart attack, death, and other risks were discussed.   Educational handouts further explaining the pathology, treatment options, and bowel regimen were given. The patient expressed understanding & wishes to proceed. We will work to coordinate surgery for a mutually convenient time.  OR FINDINGS: Patient had presacral fluid collection that appeared to connect with the distal end of the colon staple line/blind loop of the Baker style neorectal (colon) - anal anastomosis.   Mildly purulent but mostly serous fluid cavity on mid sacrum in the posterior midline presacral space of the mid/distal pelvis, 4-9 cm from anal verge. Opened up with harmonic scalpel to create 5 x 2.5 cm neorectal/colon to presacral space opening/marsupialization  No rectovaginal fistula. No abscess, perirectal fistula. Intact sphincter. No fissure   DESCRIPTION:  Informed consent was confirmed. Patient underwent general anesthesia without difficulty. Patient was placed into lithotomy positioning. The perianal region was prepped and draped in sterile fashion. Surgical timeout confirmed or plan.  I did digital rectal examination and then transitioned over to anoscopy to get a sense of the anatomy. Some chronic skin changes in the perianal region consistent with prior radiation. Sphincter tone intact. Fullness in the posterior midline presacral space. The proximal end of the colon in the left anterior circumference. Pseudo-J-pouch with distal blind loop of neorectal colon flipping proximally along the distal/mid presacral space.  After reconfirming with colonoscopic pictures as well as CT scan, I was  able to find the presacral pocket with a spinal needle going through the distal blind loop pouch posterior wall into a presacral fluid collection. I aspirated a few milliliters of mostly serous mildly purulent fluid. I used a harmonic scalpel to go through the posterior neorectal colonic wall about 4 cm proximal to the anal verge and connect into the cavity. I was able to gradually dilate and open up until I had a much more open wound. I was able to probe and find the apex of this presacral cavity around 9 cm from the anal verge. Elongated ellipsoid cavity space. Fibrotic. I encountered no more purulence  I reexamined the anal canal. There is was no narrowing. Hemostasis was good. We placed fluff gauze to onlay over the perirectal region. No internal packing done. Patient is being extubated go to recovery room.  I discussed operative findings, updated the patient's status, discussed probable steps to recovery, and gave postoperative recommendations to the patient's spouse. Recommendations were made. Questions were answered. He expressed understanding & appreciation. Instructions are written as well.  Adin Hector, M.D., F.A.C.S. Gastrointestinal and Minimally Invasive Surgery [facility] [address] [place], [place] [address] [phone] Main / Paging        Pathology: SURGICAL PATHOLOGY CASE: [id number] PATIENT: [name] Surgical Pathology Report     Clinical History: Loop ileostomy in place for fecal diversion (cm)     FINAL MICROSCOPIC DIAGNOSIS:  A. ILEOSTOMY, LOOP: - Segment of small intestine with stoma     Ryann Pauli DESCRIPTION:  Received fresh is a segment of small intestine which consists of a double barrel ostomy. Each segment of intestine  measures 4.5 cm in length. The ostomy site measures 3.5 cm in diameter and there is a rim of tan skin present. The mucosal surfaces are glistening and  tan. Sections are submitted in 1 cassette. [facility] 12/11/2019)   Final Diagnosis performed by Jaquita Folds, MD. Electronically signed 12/12/2019 Technical component performed at [facility], [place] [address] [place], [place] [address]. Professional component performed at [employer]. [facility], [place] [address], [place], [place] [address]. Immunohistochemistry Technical component (if applicable) was performed at [facility] [address] [place], [place], [place] [address]. IMMUNOHISTOCHEMISTRY DISCLAIMER (if applicable): Some of these immunohistochemical stains may have been developed and the performance characteristics determine by [facility]. Some may not have been cleared or approved by the U.S. Food and Drug Administration. The FDA has determined that such clearance or approval is not necessary. This test is used for clinical purposes. It should not be regarded as investigational or for research. This laboratory is certified under the [employer] (CLIA-88) as qualified to perform high complexity clinical laboratory testing. The controls stained appropriately. ` ` `  12/10/2019  3:17 PM  PATIENT: Kathleen Flores 74 y.o. female  Patient Care Team: [name], [place] as PCP - General (Nurse Practitioner) Michael Boston, MD as Consulting Physician (General Surgery) Milus Banister, MD as Consulting Physician (Gastroenterology) Virgina Evener, Dawn, RN (Inactive) as Oncology Nurse Navigator Ladell Pier, MD as Consulting Physician (Oncology)  PRE-OPERATIVE DIAGNOSIS: LOOP ILEOSTOMY IN PLACE FOR FECAL DIVERSION  POST-OPERATIVE DIAGNOSIS: LOOP ILEOSTOMY IN PLACE FOR FECAL DIVERSION  PROCEDURE: TAKEDOWN OF LOOP ILEOSTOMY WITH RE-ANASTOMOSIS EXAM UNDER ANESTHESIA  SURGEON: Adin Hector, MD  ASSISTANT: RNFA  ANESTHESIA: local and general  EBL: Total  I/O In: 1300 [I.V.:1200; IV Piggyback:100] Out: 250 [Urine:200; Blood:50]  Delay start of Pharmacological VTE agent (>24hrs) due to surgical blood loss or risk of bleeding: no  DRAINS: none  SPECIMEN: Loop ileostomy  DISPOSITION OF SPECIMEN: PATHOLOGY  COUNTS: YES  PLAN OF CARE: Admit to inpatient  PATIENT DISPOSITION: PACU - hemodynamically stable.  INDICATION: Pleasant patient status post low anterior resection with diverting loop ileostomy to protect the very distal anastomosis. The patient has recovered from that surgery with the anastomosis well-healed. It was felt safe to have the loop ileostomy taken down. I discussed the procedure with the patient:  The anatomy & physiology of the digestive tract was discussed. The pathophysiology was discussed. Possibility of remaining with an ostomy permanently was discussed. I offered ostomy takedown. Laparoscopic & open techniques were discussed.  Risks such as bleeding, infection, abscess, leak, reoperation, possible re-ostomy, injury to other organs, hernia, heart attack, death, and other risks were discussed. I noted a good likelihood this will help address the problem. Goals of post-operative recovery were discussed as well. We will work to minimize complications. Questions were answered. The patient expresses understanding & wishes to proceed with surgery.  OR FINDINGS: Side-to-side staple anastomosis Barcelona style of distal ileum. Normal anatomy.  DESCRIPTION:  Informed consent was confirmed. The patient received IV antibiotics & underwent general anesthesia without any difficulty. Foley catheter was sterilely placed. SCDs were active during the entire case. I did an examination under anesthesia transrectally. Sphincter tone intact and normal. I can feel the EEA anastomosis about 3 cm from the anal verge without any stricturing. No retained mucus in the neorectum. No stricture. Felt no significant  abnormalities. I detected no contraindication with proceeding with ostomy takedown. The abdomen  was prepped and draped in a sterile fashion. A surgical timeout confirmed our plan.  I made a biconcave curvilinear incision transversely around the loop ileostomy as well as some chronically thickened ulcerated skin. I got into the subcutaneous tissues. I used careful focused right angle dissection and sharp dissection. Some focused cautery dissection as well. That helped to free adhesions to the subcutaneous tisses & fascia. Gradually, I was able to enter into the peritoneum focally. I did a gentle finger sweep. Gradually came around circumferentially and freed the loop of ileum from remaining adhesions to the abdominal wall. We were able eviscerate some bowel proximally and distally.   I did a side-to-side stapled anastomosis using a 75 GIA. Silk stitch placed at the crotch of the anastomosis. I used a TX-90 stapler to staple off the common defect and resect the remaining ileum, most of it that involve the intra-abdominal ostomy component given the adhesions. . Took the mesentery with clamps and silk ties. I closed the mesenteric defect using interrupted silk stitches. Using the redundant mesentery to help cover and protect the TX staple line. The anastomosis looked healthy and viable. We returned the anastomosis into the abdominal cavity. Finger sweep circumferentially noted no adhesions. It rested well.  We changed gloves and instruments. Irrigated copiously into the wound and fascia. I reapproximated the fascia transversely using #1 PDS running suture. I irrigated into the subcutaneous tissues. The wound seem to come together more obliquely. I did to a layer of interrupted 2-0 Vicryl deep dermal sutures to help close the wound and dead space down. I reapproximated the skin using 4-0 Monocryl stitch running centrally. I excised some skin and subcutaneous tissue at the corners to  eliminate dog earring. I left the corners open. I packed the corners with anitbiotic soaked umbilical tape for wicks. Sterile dressing was applied.  Patient was extubated and is stable in the recovery room. I discussed operative findings, updated the patient's status, discussed probable steps to recovery, and gave postoperative recommendations to the patient's spouse, Gershon Mussel. Recommendations were made. Questions were answered. He expressed understanding & appreciation.   Adin Hector, M.D., F.A.C.S. Gastrointestinal and Minimally Invasive Surgery [facility] 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 Statins  Demerol *ANALGESICS - OPIOID*  Allergies Reconciled   Medication History Darden Palmer, [place]; 08/25/2020 11:52 AM) Imodium A-D (2MG  Tablet, 1 (one) Oral four times daily, as needed, Taken starting 01/01/2020) Active. Cholestyramine (4GM Packet, Oral) Active. Amoxicillin-Pot Clavulanate (875-125MG  Tablet, Oral) Active. Synthroid (125MCG Tablet, Oral) Active. Vitamin C  (Oral) Specific strength unknown - Active. Vitamin D3 (Oral) Specific strength unknown - Active. Calcium (Oral) Specific strength unknown - Active. Zinc (Oral) Specific strength unknown -  Active. Medications Reconciled  Social History Adin Hector, MD; 08/25/2020 12:16 PM) Alcohol use  Moderate alcohol use. Caffeine use  Carbonated beverages. No drug use  Tobacco use  Former smoker.  Family History Adin Hector, MD; 08/25/2020 12:16 PM) Alcohol Abuse  Father. Cancer  Mother. Heart Disease  Father. Heart disease in female family member before age 64  Hypertension  Mother.  Pregnancy / Birth History Adin Hector, MD; 08/25/2020 12:16 PM) Age at menarche  86 years. Age of menopause  36-55 Gravida  1 Maternal age  56-25 Para  82  Other Problems Adin Hector, MD; 08/25/2020 12:16 PM) High blood pressure  Hypercholesterolemia  Lump In Breast  Oophorectomy  Bilateral. Thyroid Disease  ILEITIS (K52.9)   Vitals Lattie Haw Caldwell RMA; 08/25/2020 11:52 AM) 08/25/2020 11:52 AM Weight: 127.5 lb Height: 67in Body Surface Area: 1.67 m Body Mass Index: 19.97 kg/m  Temp.: 98.50F  Pulse: 100 (Regular)  P.OX: 98% (Room air) BP: 140/80(Sitting, Left Arm, Standard)       Physical Exam Adin Hector MD; 08/25/2020 12:48 PM) General Mental Status-Alert. General Appearance-Not in acute distress. Voice-Normal. Note: Alert. Well-groomed. Moving normally. Sitting normally. No splinting or guarding. Moving easily. Not anxious or tearful   Integumentary Global Assessment Upon inspection and palpation of skin surfaces of the - Distribution of scalp and body hair is normal. General Characteristics Overall examination of the patient's skin reveals - no rashes and no suspicious lesions.  Head and Neck Head-normocephalic, atraumatic with no lesions or palpable masses. Face Global Assessment - atraumatic, no absence of  expression. Neck Global Assessment - no abnormal movements, no decreased range of motion. Trachea-midline. Thyroid Gland Characteristics - non-tender.  Eye Eyeball - Left-Extraocular movements intact, No Nystagmus - Left. Eyeball - Right-Extraocular movements intact, No Nystagmus - Right. Upper Eyelid - Left-No Cyanotic - Left. Upper Eyelid - Right-No Cyanotic - Right. Note: Wears glasses. Vision corrected   Chest and Lung Exam Inspection Accessory muscles - No use of accessory muscles in breathing.  Abdomen Note: No abdominal pain.   Female Genitourinary Note: Declines   Rectal Note: Declines.   Peripheral Vascular Upper Extremity Inspection - Left - Not Gangrenous, No Petechiae. Inspection - Right - Not Gangrenous, No Petechiae.  Neurologic Neurologic evaluation reveals -normal attention span and ability to concentrate, able to name objects and repeat phrases. Appropriate fund of knowledge and normal coordination.  Neuropsychiatric Mental status exam performed with findings of-able to articulate well with normal speech/language, rate, volume and coherence and no evidence of hallucinations, delusions, obsessions or homicidal/suicidal ideation. Orientation-oriented X3.  Musculoskeletal Global Assessment Gait and Station - normal gait and station.  Lymphatic General Lymphatics Description - No Generalized lymphadenopathy.    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 Most likely contributed by her delayed neocolonic-presacral pelvic fistula to the presacral space at the [facility] staple line. Treated with unroofing/marsupialization  I worry that she is trying to wean herself off to get off all medications. I cautioned her against that. She was down to 3 Imodium a day with still  some loose bowel movements. I have recommended going up to 4. Max 8 pills a day. She struggles that for in sometimes even 6.  I strongly recommended that she take a Imodium pills a day. 2 pills 4 times a day. Did not stray from this for  several months. See if that she can get some consistent improvement. If she is maxed out on Imodium, can revisit the idea of doing paregoric.  She can only tolerate one dose of Questran a day now. Otherwise gets epigastric discomfort. She is weaning herself down from the recommend in 4 doses a day. I wonder if she she just stop if it is not helping much versus trying to take it more often.  I strongly recommend she continue to follow up with gastroenterology to help adjust this.  High fiber diet. She misses eating vegetables. She denies any problems with that before. Reasonable to gradually introduce those vegetable foods and see if she can handle it better. He usually Starker increments some type of fiber supplement to help thicken up the stools in combination with antidiarrheals. That usually works well for patient's with J-pouch. She claims she's tried every fiber supplement and gets worsening diarrhea and does not want to try it again. I will defer to GI  Ultimately defer to gastroenterology on final dietary medication regimens for her IBS diarrhea  Calmoseptine and dry powders to help with any rash or irritation. He found it burned so she does not use much. Trying to use Kotex pad.  I noted the final option is to give her permanent colostomy. While she has been disappointed and fr Current Plans Return to clinic as needed.  Soreness, decreased appetite, and poor energy level are common problems after surgery. While many people can struggle with a bad day, these concerns should gradually fade away or at least improve. Much of your recovery depends on your health & the severity of your operation. Please call if you have any further questions / concerns  related to surgery.  Increase activity as tolerated to regular everyday activity. Consider daily low impact exercise every day such as walking an hour a day.  Do not push through pain. If it hurts to do it, then don't do it.  Diet as tolerated. Low fat high fiber diet ideal. 30 g fiber a day ideal. Consider taking a daily fiber supplement to keep your bowels regular.  Followup with your primary care physician for other health issues as would normally be done.  Consider screening for malignancies (breast, prostate, colon, melanoma, etc) as appropriate. Discuss with you primary care physician.  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 Hopefully they can get that under better control with gastroenterology Current Plans Pt Education - [facility] IBS patient info: discussed with patient and provided information. 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  Continue working with pelvic for physical therapy.  I'll discuss my partner if she'll be a candidate for sacral nerve stimulator. However, I would only consider that if her diarrhea is controlled and she still struggles. 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 Impression: Follow-up with medical oncology Current Plans Pt Education - [facility] Colorectal Cancer (AT): discussed with patient and provided information. Consider follow up colonoscopy by your  gastroenterologist, depending on your diagnosis. Call your gastroenterologist for advice  If you  had a colon or rectal cancer resected by surgery, you should strongly consider getting a colonoscopy by your gastroenterologict one year after the colon cancer was removed. If it was a benign polyp that was removed by colon resection, consider follow-up colonoscopy in about 3 years.  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 May be contributing to her IBS. Hopefully over time this will get easier to manage. PRURITUS ANI (L29.0) Impression: I had offered Calmoseptine. She found it somewhat burning and irritating. She feels like her icepack/water soaks/Kotex pad is the best way to manage and so far. I offered to examine the perianal region to make sure that there are no other concerns but she wish to hold off ANXIETY, GENERALIZED (F41.1) Impression: However is at anxiety and some depression or exacerbating this. Some people benefit from a short long-term course of Zoloft or other SSRI to help with pain and symptom and OCD/anxiety like concerns. I don't think she is open to considering that just yet.    Addendum.  Patient still struggling with Cipro and Flagyl persistent vaginal drainage.  Discussed with my colorectal partners as well as other areas.  She is exhausted all nonsurgical options and needs to proceed with takedown of coloanal anastomosis and a permanent colostomy.  On anterior resection.  See if I can do this robotically or laparoscopically.  I do not think she can wait very long we will work to proceed in a timely fashion.  Discussed with gastroenterology.  He agrees all other options have been exhausted.  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  Agrees to proceeding with permanent ostomy  The anatomy & physiology of the digestive tract was  discussed.  The pathophysiology of the rectal pathology was discussed.  Natural history risks without surgery was discussed.   I worked to give an overview of the disease and the frequent need to have multispecialty involvement.  I feel the risks of no intervention will lead to serious problems that outweigh the operative risks; therefore, I recommended a partial proctocolectomy to remove the pathology.  Minimally Invasive (Robotic/Laparoscopic) & open techniques were discussed.  We will work to preserve anal & pelvic floor function without sacrificing cure.  Risks such as bleeding, infection, abscess, leak, reoperation, possible temporary or permanent ostomy, hernia, stroke, heart attack, death, and other risks were discussed.  I noted a good likelihood this will help address the problem.   Goals of post-operative recovery were discussed as well.  We will work to minimize complications.  Educational information was available as well.  Questions were answered.  The patient expresses understanding & wishes to proceed with surgery.   Adin Hector, MD, FACS, MASCRS Gastrointestinal and Minimally Invasive Surgery  [facility] [address] [place], [place] [address] [phone] Fax [phone] Main/Paging  CONTACT INFORMATION: Weekday (9AM-5PM) concerns: Call [facility] main office at [phone] Weeknight (5PM-9AM) or Weekend/Holiday concerns: Check [email or web address] for General Surgery [facility] coverage (Please, do not use SecureChat as it is not reliable communication to operating surgeons for immediate patient care)

## 2020-09-20 ENCOUNTER — Encounter: Payer: Self-pay | Admitting: Nurse Practitioner

## 2020-09-22 NOTE — Patient Instructions (Addendum)
DUE TO COVID-19 ONLY ONE VISITOR IS ALLOWED TO COME WITH YOU AND STAY IN THE WAITING ROOM ONLY  DURING PRE OP AND PROCEDURE.   IF YOU WILL BE ADMITTED INTO THE HOSPITAL YOU ARE ALLOWED ONE SUPPORT PERSON DURING  VISITATION HOURS ONLY (10AM -8PM)   . The support person may change daily. . The support person must pass our screening, gel in and out, and wear a mask at all times, including in the patient's room. . Patients must also wear a mask when staff or their support person are in the room.   COVID SWAB TESTING MUST BE COMPLETED ON:  Friday, 09-26-20 @   [address] [place], [place] [address]  (Must self quarantine after testing. Follow instructions on  handout.)        Your procedure is scheduled on:  Tuesday, 09-30-20   Report to [facility] Main  Entrance   Report to admitting at 11:45 AM   Call this number if you have problems the morning of surgery [phone]    Follow prep per surgeon's office   Do not eat food :After Midnight.   May have liquids until 10:45 AM day of surgery  CLEAR LIQUID DIET  Foods Allowed                                                                     Foods Excluded  Water, Black Coffee and tea, regular and decaf                liquids that you cannot  Plain Jell-O in any flavor  (No red)                                      see through such as: Fruit ices (not with fruit pulp)                                      milk, soups, orange juice              Iced Popsicles (No red)                                      All solid food                                   Apple juices Sports drinks like Gatorade (No red) Lightly seasoned clear broth or consume(fat free) Sugar, honey syrup  Sample Menu Breakfast                                Lunch                                     Supper Cranberry juice  Beef broth                            Chicken broth Jell-O                                     Grape juice                            Apple juice Coffee or tea                        Jell-O                                      Popsicle                                                  Coffee or tea                        Coffee or tea      Drink 2 Ensure drinks the night before surgery.     Complete one Ensure drink the morning of surgery 3 hours prior to scheduled surgery at 10:45 AM.   Oral Hygiene is also important to reduce your risk of infection.                                    Remember - BRUSH YOUR TEETH THE MORNING OF SURGERY WITH YOUR REGULAR TOOTHPASTE   Do NOT smoke after Midnight   Take these medicines the morning of surgery with A SIP OF WATER:  Levothyroxine                                You may not have any metal on your body including hair pins, jewelry, and body piercings             Do not wear make-up, lotions, powders, perfumes/cologne, or deodorant             Do not wear nail polish.  Do not shave  48 hours prior to surgery.    Do not bring valuables to the hospital. [place].   Contacts, dentures or bridgework may not be worn into surgery.   Bring small overnight bag day of surgery.                Please read over the following fact sheets you were given: IF YOU HAVE QUESTIONS ABOUT YOUR PRE OP INSTRUCTIONS PLEASE CALL [phone]   [place] - Preparing for Surgery Before surgery, you can play an important role.  Because skin is not sterile, your skin needs to be as free of germs as possible.  You can reduce the number of germs on your skin by washing with CHG (chlorahexidine gluconate) soap before surgery.  CHG is an antiseptic cleaner which kills germs and bonds with the skin to  continue killing germs even after washing. Please DO NOT use if you have an allergy to CHG or antibacterial soaps.  If your skin becomes reddened/irritated stop using the CHG and inform your nurse when you arrive at Short Stay. Do not shave (including legs  and underarms) for at least 48 hours prior to the first CHG shower.  You may shave your face/neck.  Please follow these instructions carefully:  1.  Shower with CHG Soap the night before surgery and the  morning of surgery.  2.  If you choose to wash your hair, wash your hair first as usual with your normal  shampoo.  3.  After you shampoo, rinse your hair and body thoroughly to remove the shampoo.                             4.  Use CHG as you would any other liquid soap.  You can apply chg directly to the skin and wash.  Gently with a scrungie or clean washcloth.  5.  Apply the CHG Soap to your body ONLY FROM THE NECK DOWN.   Do   not use on face/ open                           Wound or open sores. Avoid contact with eyes, ears mouth and   genitals (private parts).                       Wash face,  Genitals (private parts) with your normal soap.             6.  Wash thoroughly, paying special attention to the area where your    surgery  will be performed.  7.  Thoroughly rinse your body with warm water from the neck down.  8.  DO NOT shower/wash with your normal soap after using and rinsing off the CHG Soap.                9.  Pat yourself dry with a clean towel.            10.  Wear clean pajamas.            11.  Place clean sheets on your bed the night of your first shower and do not  sleep with pets. Day of Surgery : Do not apply any lotions/deodorants the morning of surgery.  Please wear clean clothes to the hospital/surgery center.  FAILURE TO FOLLOW THESE INSTRUCTIONS MAY RESULT IN THE CANCELLATION OF YOUR SURGERY  PATIENT SIGNATURE_________________________________  NURSE SIGNATURE__________________________________  ________________________________________________________________________   Kathleen Flores  An incentive spirometer is a tool that can help keep your lungs clear and active. This tool measures how well you are filling your lungs with each breath. Taking long  deep breaths may help reverse or decrease the chance of developing breathing (pulmonary) problems (especially infection) following:  A long period of time when you are unable to move or be active. BEFORE THE PROCEDURE   If the spirometer includes an indicator to show your best effort, your nurse or respiratory therapist will set it to a desired goal.  If possible, sit up straight or lean slightly forward. Try not to slouch.  Hold the incentive spirometer in an upright position. INSTRUCTIONS FOR USE  1. Sit on the edge of your bed if possible, or sit up as far  as you can in bed or on a chair. 2. Hold the incentive spirometer in an upright position. 3. Breathe out normally. 4. Place the mouthpiece in your mouth and seal your lips tightly around it. 5. Breathe in slowly and as deeply as possible, raising the piston or the ball toward the top of the column. 6. Hold your breath for 3-5 seconds or for as long as possible. Allow the piston or ball to fall to the bottom of the column. 7. Remove the mouthpiece from your mouth and breathe out normally. 8. Rest for a few seconds and repeat Steps 1 through 7 at least 10 times every 1-2 hours when you are awake. Take your time and take a few normal breaths between deep breaths. 9. The spirometer may include an indicator to show your best effort. Use the indicator as a goal to work toward during each repetition. 10. After each set of 10 deep breaths, practice coughing to be sure your lungs are clear. If you have an incision (the cut made at the time of surgery), support your incision when coughing by placing a pillow or rolled up towels firmly against it. Once you are able to get out of bed, walk around indoors and cough well. You may stop using the incentive spirometer when instructed by your caregiver.  RISKS AND COMPLICATIONS  Take your time so you do not get dizzy or light-headed.  If you are in pain, you may need to take or ask for pain medication  before doing incentive spirometry. It is harder to take a deep breath if you are having pain. AFTER USE  Rest and breathe slowly and easily.  It can be helpful to keep track of a log of your progress. Your caregiver can provide you with a simple table to help with this. If you are using the spirometer at home, follow these instructions: [place] IF:   You are having difficultly using the spirometer.  You have trouble using the spirometer as often as instructed.  Your pain medication is not giving enough relief while using the spirometer.  You develop fever of 100.5 F (38.1 C) or higher. SEEK IMMEDIATE MEDICAL CARE IF:   You cough up bloody sputum that had not been present before.  You develop fever of 102 F (38.9 C) or greater.  You develop worsening pain at or near the incision site. MAKE SURE YOU:   Understand these instructions.  Will watch your condition.  Will get help right away if you are not doing well or get worse. Document Released: 04/11/2007 Document Revised: 02/21/2012 Document Reviewed: 06/12/2007 Exit[employer] Patient Information 2014 [employer], [place].   ________________________________________________________________________  WHAT IS A BLOOD TRANSFUSION? Blood Transfusion Information  A transfusion is the replacement of blood or some of its parts. Blood is made up of multiple cells which provide different functions.  Red blood cells carry oxygen and are used for blood loss replacement.  White blood cells fight against infection.  Platelets control bleeding.  Plasma helps clot blood.  Other blood products are available for specialized needs, such as hemophilia or other clotting disorders. BEFORE THE TRANSFUSION  Who gives blood for transfusions?   Healthy volunteers who are fully evaluated to make sure their blood is safe. This is blood bank blood. Transfusion therapy is the safest it has ever been in the practice of medicine. Before blood is  taken from a donor, a complete history is taken to make sure that person has no history of  diseases nor engages in risky social behavior (examples are intravenous drug use or sexual activity with multiple partners). The donor's travel history is screened to minimize risk of transmitting infections, such as malaria. The donated blood is tested for signs of infectious diseases, such as HIV and hepatitis. The blood is then tested to be sure it is compatible with you in order to minimize the chance of a transfusion reaction. If you or a relative donates blood, this is often done in anticipation of surgery and is not appropriate for emergency situations. It takes many days to process the donated blood. RISKS AND COMPLICATIONS Although transfusion therapy is very safe and saves many lives, the main dangers of transfusion include:   Getting an infectious disease.  Developing a transfusion reaction. This is an allergic reaction to something in the blood you were given. Every precaution is taken to prevent this. The decision to have a blood transfusion has been considered carefully by your caregiver before blood is given. Blood is not given unless the benefits outweigh the risks. AFTER THE TRANSFUSION  Right after receiving a blood transfusion, you will usually feel much better and more energetic. This is especially true if your red blood cells have gotten low (anemic). The transfusion raises the level of the red blood cells which carry oxygen, and this usually causes an energy increase.  The nurse administering the transfusion will monitor you carefully for complications. HOME CARE INSTRUCTIONS  No special instructions are needed after a transfusion. You may find your energy is better. Speak with your caregiver about any limitations on activity for underlying diseases you may have. SEEK MEDICAL CARE IF:   Your condition is not improving after your transfusion.  You develop redness or irritation at the  intravenous (IV) site. SEEK IMMEDIATE MEDICAL CARE IF:  Any of the following symptoms occur over the next 12 hours:  Shaking chills.  You have a temperature by mouth above 102 F (38.9 C), not controlled by medicine.  Chest, back, or muscle pain.  People around you feel you are not acting correctly or are confused.  Shortness of breath or difficulty breathing.  Dizziness and fainting.  You get a rash or develop hives.  You have a decrease in urine output.  Your urine turns a dark color or changes to pink, red, or brown. Any of the following symptoms occur over the next 10 days:  You have a temperature by mouth above 102 F (38.9 C), not controlled by medicine.  Shortness of breath.  Weakness after normal activity.  The white part of the eye turns yellow (jaundice).  You have a decrease in the amount of urine or are urinating less often.  Your urine turns a dark color or changes to pink, red, or brown. Document Released: 11/26/2000 Document Revised: 02/21/2012 Document Reviewed: 07/15/2008 [facility] Patient Information 2014 [place], [place].  _______________________________________________________________________

## 2020-09-22 NOTE — Progress Notes (Signed)
COVID Vaccine Completed: Date COVID Vaccine completed: COVID vaccine manufacturer: [employer]   PCP - [name], FNP Cardiologist -   Chest x-ray - CT chest 05-07-20 in Epic EKG -  Stress Test -  ECHO -  Cardiac Cath -  Pacemaker/ICD device last checked:  Sleep Study -  CPAP -   Fasting Blood Sugar -  Checks Blood Sugar _____ times a day  Blood Thinner Instructions: Aspirin Instructions: Last Dose:  Anesthesia review:   Patient denies shortness of breath, fever, cough and chest pain at PAT appointment   Patient verbalized understanding of instructions that were given to them at the PAT appointment. Patient was also instructed that they will need to review over the PAT instructions again at home before surgery.

## 2020-09-23 ENCOUNTER — Other Ambulatory Visit: Payer: Self-pay | Admitting: Urology

## 2020-09-24 ENCOUNTER — Inpatient Hospital Stay (HOSPITAL_COMMUNITY)
Admission: RE | Admit: 2020-09-24 | Discharge: 2020-09-24 | Disposition: A | Discharge status: Home / Self Care | Payer: PPO | Source: Ambulatory Visit

## 2020-09-25 ENCOUNTER — Emergency Department (HOSPITAL_COMMUNITY)
Admission: EM | Admit: 2020-09-25 | Discharge: 2020-09-26 | Disposition: A | Discharge status: Home / Self Care | Payer: PPO | Attending: Emergency Medicine | Admitting: Emergency Medicine

## 2020-09-25 ENCOUNTER — Emergency Department (HOSPITAL_COMMUNITY): Payer: PPO

## 2020-09-25 DIAGNOSIS — Z79899 Other long term (current) drug therapy: Secondary | ICD-10-CM | POA: Insufficient documentation

## 2020-09-25 DIAGNOSIS — Z85048 Personal history of other malignant neoplasm of rectum, rectosigmoid junction, and anus: Secondary | ICD-10-CM | POA: Diagnosis not present

## 2020-09-25 DIAGNOSIS — R109 Unspecified abdominal pain: Secondary | ICD-10-CM

## 2020-09-25 DIAGNOSIS — I10 Essential (primary) hypertension: Secondary | ICD-10-CM | POA: Insufficient documentation

## 2020-09-25 DIAGNOSIS — R69 Illness, unspecified: Secondary | ICD-10-CM | POA: Diagnosis not present

## 2020-09-25 DIAGNOSIS — Z87891 Personal history of nicotine dependence: Secondary | ICD-10-CM | POA: Insufficient documentation

## 2020-09-25 DIAGNOSIS — R197 Diarrhea, unspecified: Secondary | ICD-10-CM | POA: Diagnosis not present

## 2020-09-25 DIAGNOSIS — I700 Atherosclerosis of aorta: Secondary | ICD-10-CM | POA: Diagnosis not present

## 2020-09-25 DIAGNOSIS — E039 Hypothyroidism, unspecified: Secondary | ICD-10-CM | POA: Insufficient documentation

## 2020-09-25 DIAGNOSIS — N823 Fistula of vagina to large intestine: Secondary | ICD-10-CM | POA: Diagnosis not present

## 2020-09-25 DIAGNOSIS — K5939 Other megacolon: Secondary | ICD-10-CM | POA: Diagnosis not present

## 2020-09-25 DIAGNOSIS — R112 Nausea with vomiting, unspecified: Secondary | ICD-10-CM | POA: Diagnosis not present

## 2020-09-25 DIAGNOSIS — K6389 Other specified diseases of intestine: Secondary | ICD-10-CM | POA: Diagnosis not present

## 2020-09-25 DIAGNOSIS — R1031 Right lower quadrant pain: Secondary | ICD-10-CM | POA: Insufficient documentation

## 2020-09-25 DIAGNOSIS — R1013 Epigastric pain: Secondary | ICD-10-CM | POA: Diagnosis not present

## 2020-09-25 DIAGNOSIS — R5381 Other malaise: Secondary | ICD-10-CM | POA: Diagnosis not present

## 2020-09-25 DIAGNOSIS — E876 Hypokalemia: Secondary | ICD-10-CM | POA: Diagnosis not present

## 2020-09-25 LAB — CBC WITH DIFFERENTIAL/PLATELET
Abs Immature Granulocytes: 0.02 10*3/uL (ref 0.00–0.07)
Basophils Absolute: 0 10*3/uL (ref 0.0–0.1)
Basophils Relative: 0 %
Eosinophils Absolute: 0 10*3/uL (ref 0.0–0.5)
Eosinophils Relative: 0 %
HCT: 41.6 % (ref 36.0–46.0)
Hemoglobin: 13 g/dL (ref 12.0–15.0)
Immature Granulocytes: 0 %
Lymphocytes Relative: 4 %
Lymphs Abs: 0.4 10*3/uL — ABNORMAL LOW (ref 0.7–4.0)
MCH: 26.7 pg (ref 26.0–34.0)
MCHC: 31.3 g/dL (ref 30.0–36.0)
MCV: 85.4 fL (ref 80.0–100.0)
Monocytes Absolute: 0.4 10*3/uL (ref 0.1–1.0)
Monocytes Relative: 4 %
Neutro Abs: 9.7 10*3/uL — ABNORMAL HIGH (ref 1.7–7.7)
Neutrophils Relative %: 92 %
Platelets: 369 10*3/uL (ref 150–400)
RBC: 4.87 MIL/uL (ref 3.87–5.11)
RDW: 15.5 % (ref 11.5–15.5)
WBC: 10.5 10*3/uL (ref 4.0–10.5)
nRBC: 0 % (ref 0.0–0.2)

## 2020-09-25 MED ORDER — PANTOPRAZOLE SODIUM 40 MG IV SOLR
40.0000 mg | Freq: Once | INTRAVENOUS | Status: AC
  Administered 2020-09-26: 40 mg via INTRAVENOUS
  Filled 2020-09-25: qty 40

## 2020-09-25 MED ORDER — DICYCLOMINE HCL 10 MG PO CAPS
10.0000 mg | ORAL_CAPSULE | Freq: Once | ORAL | Status: AC
  Administered 2020-09-25: 10 mg via ORAL
  Filled 2020-09-25: qty 1

## 2020-09-25 MED ORDER — LIDOCAINE VISCOUS HCL 2 % MT SOLN
15.0000 mL | Freq: Once | OROMUCOSAL | Status: AC
  Administered 2020-09-25: 15 mL via ORAL
  Filled 2020-09-25: qty 15

## 2020-09-25 MED ORDER — ONDANSETRON HCL 4 MG/2ML IJ SOLN
4.0000 mg | Freq: Once | INTRAMUSCULAR | Status: AC
  Administered 2020-09-25: 4 mg via INTRAVENOUS
  Filled 2020-09-25: qty 2

## 2020-09-25 MED ORDER — ALUM & MAG HYDROXIDE-SIMETH 200-200-20 MG/5ML PO SUSP
30.0000 mL | Freq: Once | ORAL | Status: AC
  Administered 2020-09-25: 30 mL via ORAL
  Filled 2020-09-25: qty 30

## 2020-09-25 MED ORDER — SODIUM CHLORIDE 0.9 % IV BOLUS
1000.0000 mL | Freq: Once | INTRAVENOUS | Status: AC
  Administered 2020-09-25: 1000 mL via INTRAVENOUS

## 2020-09-25 MED ORDER — MORPHINE SULFATE (PF) 2 MG/ML IV SOLN
2.0000 mg | Freq: Once | INTRAVENOUS | Status: AC
  Administered 2020-09-25: 2 mg via INTRAVENOUS
  Filled 2020-09-25: qty 1

## 2020-09-25 NOTE — ED Triage Notes (Signed)
Patient here for epigastric abdominal pain, started at 1600 today. Patient states two previous similar episodes, saw gastroenterologist, was told pain was related to colon cancer which she is now in remission. Patient had a rectal fistula repair this year. Patient has a current vaginal fistula that she was told needed surgery but patient is postponing due to wanting a second opinion. Patient vomited twice this afternoon, patient denies diarrhea or fevers. Patient takes Synthroid, Imodium, and vitamins on a daily basis. Patient denies any COVID symptoms or being around anyone who has been sick. Patient is AxOx4, VSS.
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
## 2020-09-26 ENCOUNTER — Encounter (HOSPITAL_COMMUNITY): Payer: Self-pay

## 2020-09-26 ENCOUNTER — Emergency Department (HOSPITAL_COMMUNITY): Payer: PPO

## 2020-09-26 ENCOUNTER — Other Ambulatory Visit (HOSPITAL_COMMUNITY): Payer: PPO

## 2020-09-26 DIAGNOSIS — N823 Fistula of vagina to large intestine: Secondary | ICD-10-CM | POA: Diagnosis not present

## 2020-09-26 DIAGNOSIS — I700 Atherosclerosis of aorta: Secondary | ICD-10-CM | POA: Diagnosis not present

## 2020-09-26 LAB — COMPREHENSIVE METABOLIC PANEL
ALT: 13 U/L (ref 0–44)
AST: 17 U/L (ref 15–41)
Albumin: 4.1 g/dL (ref 3.5–5.0)
Alkaline Phosphatase: 82 U/L (ref 38–126)
Anion gap: 10 (ref 5–15)
BUN: 13 mg/dL (ref 8–23)
CO2: 28 mmol/L (ref 22–32)
Calcium: 9.2 mg/dL (ref 8.9–10.3)
Chloride: 101 mmol/L (ref 98–111)
Creatinine, Ser: 0.47 mg/dL (ref 0.44–1.00)
GFR, Estimated: >60 mL/min (ref >60)
Glucose, Bld: 134 mg/dL — ABNORMAL HIGH (ref 70–99)
Potassium: 3.6 mmol/L (ref 3.5–5.1)
Sodium: 139 mmol/L (ref 135–145)
Total Bilirubin: 0.7 mg/dL (ref 0.3–1.2)
Total Protein: 8 g/dL (ref 6.5–8.1)

## 2020-09-26 LAB — LIPASE, BLOOD: Lipase: 22 U/L (ref 11–51)

## 2020-09-26 LAB — LACTIC ACID, PLASMA: Lactic Acid, Venous: 1.8 mmol/L (ref 0.5–1.9)

## 2020-09-26 MED ORDER — SODIUM CHLORIDE (PF) 0.9 % IJ SOLN
INTRAMUSCULAR | Status: AC
  Filled 2020-09-26: qty 50

## 2020-09-26 MED ORDER — DICYCLOMINE HCL 10 MG/5ML PO SOLN: 10.0000 mg | Freq: Three times a day (TID) | ORAL | 12 refills | Status: DC | PRN

## 2020-09-26 MED ORDER — ONDANSETRON 4 MG PO TBDP: 4.0000 mg | ORAL_TABLET | Freq: Three times a day (TID) | ORAL | 0 refills | Status: DC | PRN

## 2020-09-26 NOTE — Discharge Instructions (Addendum)

## 2020-09-27 ENCOUNTER — Telehealth (HOSPITAL_COMMUNITY): Payer: Self-pay | Admitting: Emergency Medicine

## 2020-09-27 MED ORDER — DICYCLOMINE HCL 10 MG/5ML PO SOLN
10.0000 mg | Freq: Three times a day (TID) | ORAL | 12 refills | Status: DC | PRN
Start: 2020-09-27 — End: 2020-10-01

## 2020-09-27 MED ORDER — ONDANSETRON 4 MG PO TBDP: 4.0000 mg | ORAL_TABLET | Freq: Three times a day (TID) | ORAL | 0 refills | Status: AC | PRN

## 2020-09-27 NOTE — Telephone Encounter (Signed)
Bentyl and Zofran reorders e-Rxd to her pharmacy.

## 2020-09-28 ENCOUNTER — Telehealth: Payer: Self-pay | Admitting: Surgery

## 2020-09-28 NOTE — Telephone Encounter (Signed)
ED CM received message from EDP concerning following up with patient on prescriptions that were resent to patient's pharmacy. CM unable to view previous  Orders received error code in [place] , CM will try to review again later.

## 2020-09-30 ENCOUNTER — Telehealth: Payer: Self-pay | Admitting: Gastroenterology

## 2020-09-30 ENCOUNTER — Inpatient Hospital Stay (HOSPITAL_COMMUNITY): Admission: RE | Admit: 2020-09-30 | Payer: PPO | Source: Home / Self Care | Admitting: Surgery

## 2020-09-30 ENCOUNTER — Encounter (HOSPITAL_COMMUNITY): Admission: RE | Payer: Self-pay | Source: Home / Self Care

## 2020-09-30 SURGERY — RESECTION, RECTUM, LOW ANTERIOR, ROBOT-ASSISTED
Anesthesia: General

## 2020-09-30 NOTE — Telephone Encounter (Signed)
Pt is requesting a call back from a nurse to provide an update from her recent ED visit.

## 2020-10-01 ENCOUNTER — Other Ambulatory Visit: Payer: Self-pay

## 2020-10-01 MED ORDER — DICYCLOMINE HCL 10 MG PO CAPS: 10.0000 mg | ORAL_CAPSULE | Freq: Two times a day (BID) | ORAL | 6 refills | Status: DC | PRN

## 2020-10-01 NOTE — Telephone Encounter (Signed)
I am happy to write her a fuller prescription for this, same dose, take once or twice daily as needed.  Dispense 60 with 6 refills.  Thanks

## 2020-10-01 NOTE — Telephone Encounter (Signed)
Pt last saw Nevin Bloodgood

## 2020-10-01 NOTE — Telephone Encounter (Signed)
Spoke with the patient. She wanted to let Dr Ardis Hughs know that with "the blessing of Dr Johney Maine", she is seeking a 2nd opinion about having "a colostomy bag for the rest of my life." He has arranged a referral to Dr Cheryll Cockayne at [facility]. She was seen in the ED 09/25/20. She was prescribed Bentyl. She has found that this works better for her than Levsin. She would like to be able to refill it in the future when she runs out. She takes it once a day at the most. She knows it is similar to the Levsin and will not use both. Thanks

## 2020-10-08 DIAGNOSIS — R8290 Unspecified abnormal findings in urine: Secondary | ICD-10-CM | POA: Diagnosis not present

## 2020-10-08 DIAGNOSIS — N760 Acute vaginitis: Secondary | ICD-10-CM | POA: Diagnosis not present

## 2020-10-08 DIAGNOSIS — N739 Female pelvic inflammatory disease, unspecified: Secondary | ICD-10-CM | POA: Diagnosis not present

## 2020-10-16 ENCOUNTER — Encounter: Payer: PPO | Admitting: Physical Therapy

## 2020-10-17 ENCOUNTER — Telehealth: Payer: Self-pay | Admitting: Nurse Practitioner

## 2020-10-17 NOTE — Telephone Encounter (Signed)
Rescheduled appointment per provider PAL schedule. Called patient, no answer. Left message for patient with updated appointment date and time.

## 2020-10-21 DIAGNOSIS — Z888 Allergy status to other drugs, medicaments and biological substances status: Secondary | ICD-10-CM | POA: Diagnosis not present

## 2020-10-21 DIAGNOSIS — Z85048 Personal history of other malignant neoplasm of rectum, rectosigmoid junction, and anus: Secondary | ICD-10-CM | POA: Diagnosis not present

## 2020-10-21 DIAGNOSIS — N824 Other female intestinal-genital tract fistulae: Secondary | ICD-10-CM | POA: Diagnosis not present

## 2020-10-21 DIAGNOSIS — N823 Fistula of vagina to large intestine: Secondary | ICD-10-CM | POA: Diagnosis not present

## 2020-10-21 DIAGNOSIS — K9189 Other postprocedural complications and disorders of digestive system: Secondary | ICD-10-CM | POA: Diagnosis not present

## 2020-10-21 DIAGNOSIS — Z87891 Personal history of nicotine dependence: Secondary | ICD-10-CM | POA: Diagnosis not present

## 2020-10-21 DIAGNOSIS — R198 Other specified symptoms and signs involving the digestive system and abdomen: Secondary | ICD-10-CM | POA: Diagnosis not present

## 2020-10-23 ENCOUNTER — Encounter: Payer: PPO | Admitting: Physical Therapy

## 2020-10-27 ENCOUNTER — Telehealth: Payer: Self-pay | Admitting: Gastroenterology

## 2020-10-27 DIAGNOSIS — N390 Urinary tract infection, site not specified: Secondary | ICD-10-CM | POA: Diagnosis not present

## 2020-10-27 DIAGNOSIS — R319 Hematuria, unspecified: Secondary | ICD-10-CM | POA: Diagnosis not present

## 2020-10-27 MED ORDER — LOPERAMIDE HCL 2 MG PO CAPS: ORAL_CAPSULE | ORAL | 5 refills | Status: DC

## 2020-10-27 NOTE — Telephone Encounter (Signed)
Called patient to verify which medication she needs filled.  Patient stated it is cheaper for her to have Rx called in for imodium(loperamide) rather than picking it up over the counter.  Rx sent to pharmacy as requested.

## 2020-10-29 DIAGNOSIS — N823 Fistula of vagina to large intestine: Secondary | ICD-10-CM | POA: Diagnosis not present

## 2020-10-29 DIAGNOSIS — M4628 Osteomyelitis of vertebra, sacral and sacrococcygeal region: Secondary | ICD-10-CM | POA: Diagnosis not present

## 2020-10-29 DIAGNOSIS — N824 Other female intestinal-genital tract fistulae: Secondary | ICD-10-CM | POA: Diagnosis not present

## 2020-10-29 DIAGNOSIS — B372 Candidiasis of skin and nail: Secondary | ICD-10-CM | POA: Diagnosis not present

## 2020-10-30 ENCOUNTER — Encounter: Payer: PPO | Admitting: Physical Therapy

## 2020-11-07 ENCOUNTER — Other Ambulatory Visit: Payer: PPO

## 2020-11-07 ENCOUNTER — Ambulatory Visit: Payer: PPO | Admitting: Nurse Practitioner

## 2020-11-10 ENCOUNTER — Other Ambulatory Visit: Payer: Self-pay

## 2020-11-10 ENCOUNTER — Inpatient Hospital Stay: Payer: PPO | Attending: Nurse Practitioner

## 2020-11-10 ENCOUNTER — Inpatient Hospital Stay: Payer: PPO | Admitting: Nurse Practitioner

## 2020-11-10 ENCOUNTER — Encounter: Payer: Self-pay | Admitting: Nurse Practitioner

## 2020-11-10 VITALS — BP 184/62 | HR 92 | Temp 97.8°F | Resp 18 | Ht 67.0 in | Wt 125.9 lb

## 2020-11-10 DIAGNOSIS — N828 Other female genital tract fistulae: Secondary | ICD-10-CM | POA: Insufficient documentation

## 2020-11-10 DIAGNOSIS — C20 Malignant neoplasm of rectum: Secondary | ICD-10-CM

## 2020-11-10 DIAGNOSIS — I10 Essential (primary) hypertension: Secondary | ICD-10-CM | POA: Insufficient documentation

## 2020-11-10 DIAGNOSIS — R194 Change in bowel habit: Secondary | ICD-10-CM | POA: Insufficient documentation

## 2020-11-10 DIAGNOSIS — F419 Anxiety disorder, unspecified: Secondary | ICD-10-CM | POA: Insufficient documentation

## 2020-11-10 DIAGNOSIS — M4628 Osteomyelitis of vertebra, sacral and sacrococcygeal region: Secondary | ICD-10-CM | POA: Insufficient documentation

## 2020-11-10 DIAGNOSIS — K604 Rectal fistula: Secondary | ICD-10-CM | POA: Diagnosis not present

## 2020-11-10 DIAGNOSIS — N898 Other specified noninflammatory disorders of vagina: Secondary | ICD-10-CM | POA: Diagnosis not present

## 2020-11-10 LAB — CEA (IN HOUSE-CHCC): CEA (CHCC-In House): <1 ng/mL (ref 0.00–5.00)
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
## 2020-11-11 ENCOUNTER — Telehealth: Payer: Self-pay | Admitting: Oncology

## 2020-11-11 DIAGNOSIS — Z20822 Contact with and (suspected) exposure to covid-19: Secondary | ICD-10-CM | POA: Diagnosis not present

## 2020-11-11 DIAGNOSIS — Z9189 Other specified personal risk factors, not elsewhere classified: Secondary | ICD-10-CM | POA: Diagnosis not present

## 2020-11-11 NOTE — Telephone Encounter (Signed)
Scheduled appointments per 11/29 los. Spoke to patient who is aware of appointments dates and times.

## 2020-11-13 DIAGNOSIS — Z419 Encounter for procedure for purposes other than remedying health state, unspecified: Secondary | ICD-10-CM | POA: Diagnosis not present

## 2020-11-13 DIAGNOSIS — I10 Essential (primary) hypertension: Secondary | ICD-10-CM | POA: Diagnosis not present

## 2020-11-13 DIAGNOSIS — N824 Other female intestinal-genital tract fistulae: Secondary | ICD-10-CM | POA: Diagnosis not present

## 2020-11-13 DIAGNOSIS — Z87891 Personal history of nicotine dependence: Secondary | ICD-10-CM | POA: Diagnosis not present

## 2020-11-13 DIAGNOSIS — E039 Hypothyroidism, unspecified: Secondary | ICD-10-CM | POA: Diagnosis not present

## 2020-11-13 DIAGNOSIS — Z885 Allergy status to narcotic agent status: Secondary | ICD-10-CM | POA: Diagnosis not present

## 2020-11-13 DIAGNOSIS — Z923 Personal history of irradiation: Secondary | ICD-10-CM | POA: Diagnosis not present

## 2020-11-13 DIAGNOSIS — N951 Menopausal and female climacteric states: Secondary | ICD-10-CM | POA: Diagnosis not present

## 2020-11-13 DIAGNOSIS — Z85048 Personal history of other malignant neoplasm of rectum, rectosigmoid junction, and anus: Secondary | ICD-10-CM | POA: Diagnosis not present

## 2020-11-13 DIAGNOSIS — Z888 Allergy status to other drugs, medicaments and biological substances status: Secondary | ICD-10-CM | POA: Diagnosis not present

## 2020-11-13 DIAGNOSIS — Z90710 Acquired absence of both cervix and uterus: Secondary | ICD-10-CM | POA: Diagnosis not present

## 2020-11-13 DIAGNOSIS — K9189 Other postprocedural complications and disorders of digestive system: Secondary | ICD-10-CM | POA: Diagnosis not present

## 2020-11-13 DIAGNOSIS — N823 Fistula of vagina to large intestine: Secondary | ICD-10-CM | POA: Diagnosis not present

## 2020-11-13 DIAGNOSIS — M4628 Osteomyelitis of vertebra, sacral and sacrococcygeal region: Secondary | ICD-10-CM | POA: Diagnosis not present

## 2020-11-15 DIAGNOSIS — Z923 Personal history of irradiation: Secondary | ICD-10-CM | POA: Diagnosis not present

## 2020-11-15 DIAGNOSIS — K6289 Other specified diseases of anus and rectum: Secondary | ICD-10-CM | POA: Diagnosis not present

## 2020-11-17 DIAGNOSIS — N951 Menopausal and female climacteric states: Secondary | ICD-10-CM | POA: Diagnosis not present

## 2020-11-17 DIAGNOSIS — N824 Other female intestinal-genital tract fistulae: Secondary | ICD-10-CM | POA: Diagnosis not present

## 2020-11-17 DIAGNOSIS — T82539A Leakage of unspecified cardiac and vascular devices and implants, initial encounter: Secondary | ICD-10-CM | POA: Diagnosis not present

## 2020-11-17 DIAGNOSIS — Z888 Allergy status to other drugs, medicaments and biological substances status: Secondary | ICD-10-CM | POA: Diagnosis not present

## 2020-11-17 DIAGNOSIS — M4628 Osteomyelitis of vertebra, sacral and sacrococcygeal region: Secondary | ICD-10-CM | POA: Diagnosis not present

## 2020-11-17 DIAGNOSIS — Z433 Encounter for attention to colostomy: Secondary | ICD-10-CM | POA: Diagnosis not present

## 2020-11-17 DIAGNOSIS — Z923 Personal history of irradiation: Secondary | ICD-10-CM | POA: Diagnosis not present

## 2020-11-17 DIAGNOSIS — I10 Essential (primary) hypertension: Secondary | ICD-10-CM | POA: Diagnosis not present

## 2020-11-17 DIAGNOSIS — N823 Fistula of vagina to large intestine: Secondary | ICD-10-CM | POA: Diagnosis not present

## 2020-11-17 DIAGNOSIS — Z85048 Personal history of other malignant neoplasm of rectum, rectosigmoid junction, and anus: Secondary | ICD-10-CM | POA: Diagnosis not present

## 2020-11-17 DIAGNOSIS — G8918 Other acute postprocedural pain: Secondary | ICD-10-CM | POA: Diagnosis not present

## 2020-11-17 DIAGNOSIS — Z885 Allergy status to narcotic agent status: Secondary | ICD-10-CM | POA: Diagnosis not present

## 2020-11-17 DIAGNOSIS — E039 Hypothyroidism, unspecified: Secondary | ICD-10-CM | POA: Diagnosis not present

## 2020-11-17 DIAGNOSIS — Z87891 Personal history of nicotine dependence: Secondary | ICD-10-CM | POA: Diagnosis not present

## 2020-11-17 DIAGNOSIS — Z90710 Acquired absence of both cervix and uterus: Secondary | ICD-10-CM | POA: Diagnosis not present

## 2020-11-18 DIAGNOSIS — Z433 Encounter for attention to colostomy: Secondary | ICD-10-CM | POA: Diagnosis not present

## 2020-11-18 DIAGNOSIS — T82539A Leakage of unspecified cardiac and vascular devices and implants, initial encounter: Secondary | ICD-10-CM | POA: Diagnosis not present

## 2020-11-18 DIAGNOSIS — N823 Fistula of vagina to large intestine: Secondary | ICD-10-CM | POA: Diagnosis not present

## 2020-12-04 DIAGNOSIS — Z433 Encounter for attention to colostomy: Secondary | ICD-10-CM | POA: Diagnosis not present

## 2020-12-17 DIAGNOSIS — Z933 Colostomy status: Secondary | ICD-10-CM | POA: Diagnosis not present

## 2020-12-17 DIAGNOSIS — M4628 Osteomyelitis of vertebra, sacral and sacrococcygeal region: Secondary | ICD-10-CM | POA: Diagnosis not present

## 2020-12-17 DIAGNOSIS — Z923 Personal history of irradiation: Secondary | ICD-10-CM | POA: Diagnosis not present

## 2020-12-17 DIAGNOSIS — R600 Localized edema: Secondary | ICD-10-CM | POA: Diagnosis not present

## 2020-12-17 DIAGNOSIS — M899 Disorder of bone, unspecified: Secondary | ICD-10-CM | POA: Diagnosis not present

## 2020-12-17 DIAGNOSIS — Z90710 Acquired absence of both cervix and uterus: Secondary | ICD-10-CM | POA: Diagnosis not present

## 2020-12-17 DIAGNOSIS — Z85048 Personal history of other malignant neoplasm of rectum, rectosigmoid junction, and anus: Secondary | ICD-10-CM | POA: Diagnosis not present

## 2020-12-24 DIAGNOSIS — T66XXXD Radiation sickness, unspecified, subsequent encounter: Secondary | ICD-10-CM | POA: Diagnosis not present

## 2020-12-24 DIAGNOSIS — Z933 Colostomy status: Secondary | ICD-10-CM | POA: Diagnosis not present

## 2020-12-24 DIAGNOSIS — B372 Candidiasis of skin and nail: Secondary | ICD-10-CM | POA: Diagnosis not present

## 2021-01-09 DIAGNOSIS — Z433 Encounter for attention to colostomy: Secondary | ICD-10-CM | POA: Diagnosis not present

## 2021-01-20 ENCOUNTER — Other Ambulatory Visit: Payer: Self-pay | Admitting: Nurse Practitioner

## 2021-01-20 DIAGNOSIS — Z1231 Encounter for screening mammogram for malignant neoplasm of breast: Secondary | ICD-10-CM

## 2021-02-10 DIAGNOSIS — Z433 Encounter for attention to colostomy: Secondary | ICD-10-CM | POA: Diagnosis not present

## 2021-02-17 DIAGNOSIS — H2513 Age-related nuclear cataract, bilateral: Secondary | ICD-10-CM | POA: Diagnosis not present

## 2021-02-17 DIAGNOSIS — H524 Presbyopia: Secondary | ICD-10-CM | POA: Diagnosis not present

## 2021-03-10 ENCOUNTER — Ambulatory Visit
Admission: RE | Admit: 2021-03-10 | Discharge: 2021-03-10 | Disposition: A | Discharge status: Home / Self Care | Payer: PPO | Source: Ambulatory Visit | Attending: Nurse Practitioner | Admitting: Nurse Practitioner

## 2021-03-10 ENCOUNTER — Other Ambulatory Visit: Payer: Self-pay

## 2021-03-10 DIAGNOSIS — Z1231 Encounter for screening mammogram for malignant neoplasm of breast: Secondary | ICD-10-CM | POA: Diagnosis not present

## 2021-03-12 ENCOUNTER — Other Ambulatory Visit: Payer: Self-pay | Admitting: Nurse Practitioner

## 2021-03-12 DIAGNOSIS — R928 Other abnormal and inconclusive findings on diagnostic imaging of breast: Secondary | ICD-10-CM

## 2021-03-26 DIAGNOSIS — Z433 Encounter for attention to colostomy: Secondary | ICD-10-CM | POA: Diagnosis not present

## 2021-04-01 ENCOUNTER — Ambulatory Visit
Admission: RE | Admit: 2021-04-01 | Discharge: 2021-04-01 | Disposition: A | Discharge status: Home / Self Care | Payer: PPO | Source: Ambulatory Visit | Attending: Nurse Practitioner | Admitting: Nurse Practitioner

## 2021-04-01 ENCOUNTER — Other Ambulatory Visit: Payer: Self-pay | Admitting: Nurse Practitioner

## 2021-04-01 ENCOUNTER — Other Ambulatory Visit: Payer: Self-pay

## 2021-04-01 DIAGNOSIS — N6323 Unspecified lump in the left breast, lower outer quadrant: Secondary | ICD-10-CM | POA: Diagnosis not present

## 2021-04-01 DIAGNOSIS — R928 Other abnormal and inconclusive findings on diagnostic imaging of breast: Secondary | ICD-10-CM

## 2021-04-01 DIAGNOSIS — D0512 Intraductal carcinoma in situ of left breast: Secondary | ICD-10-CM | POA: Diagnosis not present

## 2021-04-01 DIAGNOSIS — N6325 Unspecified lump in the left breast, overlapping quadrants: Secondary | ICD-10-CM | POA: Diagnosis not present

## 2021-04-01 DIAGNOSIS — R922 Inconclusive mammogram: Secondary | ICD-10-CM | POA: Diagnosis not present

## 2021-04-01 DIAGNOSIS — N6321 Unspecified lump in the left breast, upper outer quadrant: Secondary | ICD-10-CM | POA: Diagnosis not present

## 2021-04-07 ENCOUNTER — Encounter: Payer: Self-pay | Admitting: *Deleted

## 2021-04-07 DIAGNOSIS — D0512 Intraductal carcinoma in situ of left breast: Secondary | ICD-10-CM

## 2021-04-08 ENCOUNTER — Telehealth: Payer: Self-pay | Admitting: Hematology and Oncology

## 2021-04-08 NOTE — Telephone Encounter (Signed)
Spoke to patient to confirm afternoon [facility] appointment for 5/4, packet mailed to patient
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
## 2021-04-15 ENCOUNTER — Inpatient Hospital Stay: Payer: PPO

## 2021-04-15 ENCOUNTER — Encounter: Payer: Self-pay | Admitting: General Practice

## 2021-04-15 ENCOUNTER — Ambulatory Visit: Payer: PPO | Admitting: Physical Therapy

## 2021-04-15 ENCOUNTER — Other Ambulatory Visit: Payer: Self-pay

## 2021-04-15 ENCOUNTER — Other Ambulatory Visit: Payer: Self-pay | Admitting: General Surgery

## 2021-04-15 ENCOUNTER — Ambulatory Visit
Admission: RE | Admit: 2021-04-15 | Discharge: 2021-04-15 | Disposition: A | Discharge status: Home / Self Care | Payer: PPO | Source: Ambulatory Visit | Attending: Radiation Oncology | Admitting: Radiation Oncology

## 2021-04-15 ENCOUNTER — Encounter: Payer: Self-pay | Admitting: Hematology and Oncology

## 2021-04-15 ENCOUNTER — Inpatient Hospital Stay: Payer: PPO | Attending: Hematology and Oncology | Admitting: Hematology and Oncology

## 2021-04-15 DIAGNOSIS — I10 Essential (primary) hypertension: Secondary | ICD-10-CM | POA: Diagnosis not present

## 2021-04-15 DIAGNOSIS — Z87891 Personal history of nicotine dependence: Secondary | ICD-10-CM | POA: Insufficient documentation

## 2021-04-15 DIAGNOSIS — Z90710 Acquired absence of both cervix and uterus: Secondary | ICD-10-CM | POA: Diagnosis not present

## 2021-04-15 DIAGNOSIS — C20 Malignant neoplasm of rectum: Secondary | ICD-10-CM

## 2021-04-15 DIAGNOSIS — M869 Osteomyelitis, unspecified: Secondary | ICD-10-CM | POA: Diagnosis not present

## 2021-04-15 DIAGNOSIS — D0512 Intraductal carcinoma in situ of left breast: Secondary | ICD-10-CM

## 2021-04-15 DIAGNOSIS — Z85048 Personal history of other malignant neoplasm of rectum, rectosigmoid junction, and anus: Secondary | ICD-10-CM | POA: Diagnosis not present

## 2021-04-15 DIAGNOSIS — F419 Anxiety disorder, unspecified: Secondary | ICD-10-CM | POA: Insufficient documentation

## 2021-04-15 DIAGNOSIS — Z170 Estrogen receptor positive status [ER+]: Secondary | ICD-10-CM | POA: Diagnosis not present

## 2021-04-15 LAB — CBC WITH DIFFERENTIAL (CANCER CENTER ONLY)
Abs Immature Granulocytes: 0.01 10*3/uL (ref 0.00–0.07)
Basophils Absolute: 0 10*3/uL (ref 0.0–0.1)
Basophils Relative: 0 %
Eosinophils Absolute: 0 10*3/uL (ref 0.0–0.5)
Eosinophils Relative: 1 %
HCT: 37.1 % (ref 36.0–46.0)
Hemoglobin: 12.3 g/dL (ref 12.0–15.0)
Immature Granulocytes: 0 %
Lymphocytes Relative: 27 %
Lymphs Abs: 1.5 10*3/uL (ref 0.7–4.0)
MCH: 30.9 pg (ref 26.0–34.0)
MCHC: 33.2 g/dL (ref 30.0–36.0)
MCV: 93.2 fL (ref 80.0–100.0)
Monocytes Absolute: 0.4 10*3/uL (ref 0.1–1.0)
Monocytes Relative: 7 %
Neutro Abs: 3.6 10*3/uL (ref 1.7–7.7)
Neutrophils Relative %: 65 %
Platelet Count: 233 10*3/uL (ref 150–400)
RBC: 3.98 MIL/uL (ref 3.87–5.11)
RDW: 12.5 % (ref 11.5–15.5)
WBC Count: 5.5 10*3/uL (ref 4.0–10.5)
nRBC: 0 % (ref 0.0–0.2)

## 2021-04-15 LAB — CMP (CANCER CENTER ONLY)
ALT: 16 U/L (ref 0–44)
AST: 21 U/L (ref 15–41)
Albumin: 4.3 g/dL (ref 3.5–5.0)
Alkaline Phosphatase: 93 U/L (ref 38–126)
Anion gap: 10 (ref 5–15)
BUN: 14 mg/dL (ref 8–23)
CO2: 28 mmol/L (ref 22–32)
Calcium: 9.8 mg/dL (ref 8.9–10.3)
Chloride: 103 mmol/L (ref 98–111)
Creatinine: 0.73 mg/dL (ref 0.44–1.00)
GFR, Estimated: >60 mL/min (ref >60)
Glucose, Bld: 127 mg/dL — ABNORMAL HIGH (ref 70–99)
Potassium: 3.7 mmol/L (ref 3.5–5.1)
Sodium: 141 mmol/L (ref 135–145)
Total Bilirubin: 0.9 mg/dL (ref 0.3–1.2)
Total Protein: 7.8 g/dL (ref 6.5–8.1)

## 2021-04-15 LAB — GENETIC SCREENING ORDER
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
## 2021-04-15 NOTE — Progress Notes (Signed)
[place] Psychosocial Distress Screening Spiritual Care  Met with Elfriede and her husband Gershon Mussel in [facility] to introduce [facility] team/resources, reviewing distress screen per protocol.  The patient scored a 5 on the Psychosocial Distress Thermometer which indicates moderate distress. Also assessed for distress and other psychosocial needs.   ONCBCN DISTRESS SCREENING 04/15/2021  Screening Type Initial Screening  Distress experienced in past week (1-10) 5  Emotional problem type Adjusting to illness  Referral to support programs Yes   Per Ms Winslow, more distressing than her anticipated breast cancer treatment is having a colostomy from colorectal cancer treatment, which has been a barrier to her comfort in participating in social meaning-making activities such as attending church.   Follow up needed: Yes.  Ms Bracco welcomes a follow-up pastoral check-in in ca 2 weeks, so I will phone her.   [place], [place], [facility] Pager [phone] Voicemail [phone]

## 2021-04-15 NOTE — Assessment & Plan Note (Signed)
04/07/2021:Screening mammogram showed a left breast mass. Diagnostic mammogram and US showed a 1.2cm mass at the 3 o'clock position in the left breast and no left axillary adenopathy. Biopsy showed low to intermediate grade DCIS, ER/PR+ 95%.   Pathology review: I discussed with the patient the difference between DCIS and invasive breast cancer. It is considered a precancerous lesion. DCIS is classified as a 0. It is generally detected through mammograms as calcifications. We discussed the significance of grades and its impact on prognosis. We also discussed the importance of ER and PR receptors and their implications to adjuvant treatment options. Prognosis of DCIS dependence on grade, comedo necrosis. It is anticipated that if not treated, 20-30% of DCIS can develop into invasive breast cancer.  Recommendation: 1. Breast conserving surgery 2. patient does not want to do adjuvant radiation because of her prior experience with radiation for her rectal cancer 3. Followed by antiestrogen therapy with tamoxifen 5 years versus anastrozole.  Her husband was very concerned about risk of thromboembolic events with tamoxifen.  We discussed about starting her on a half a dose and see if she tolerates it better.  Tamoxifen counseling: We discussed the risks and benefits of tamoxifen. These include but not limited to insomnia, hot flashes, mood changes, vaginal dryness, and weight gain. Although rare, serious side effects including endometrial cancer, risk of blood clots were also discussed. We strongly believe that the benefits far outweigh the risks. Patient understands these risks and consented to starting treatment. Planned treatment duration is 5 years.  Return to clinic after surgery to discuss antiestrogen treatment plan. Return to clinic after surgery to discuss the final pathology report and come up with an adjuvant treatment plan.

## 2021-04-16 ENCOUNTER — Encounter: Payer: Self-pay | Admitting: *Deleted

## 2021-04-17 ENCOUNTER — Telehealth: Payer: Self-pay | Admitting: *Deleted

## 2021-04-17 ENCOUNTER — Other Ambulatory Visit: Payer: Self-pay | Admitting: General Surgery

## 2021-04-17 ENCOUNTER — Encounter: Payer: Self-pay | Admitting: *Deleted

## 2021-04-17 DIAGNOSIS — D0512 Intraductal carcinoma in situ of left breast: Secondary | ICD-10-CM

## 2021-04-17 NOTE — Telephone Encounter (Signed)
Spoke to pt concerning [facility] from 5.4.22. Denies questions or concerns regarding dx or treatment care plan. Encourage pt to call with needs. Received verbal understanding. Scheduled and confirmed post op with Dr. Lindi Adie on 5/24 at 10:45am

## 2021-04-20 ENCOUNTER — Other Ambulatory Visit: Payer: Self-pay

## 2021-04-20 ENCOUNTER — Encounter (HOSPITAL_BASED_OUTPATIENT_CLINIC_OR_DEPARTMENT_OTHER): Payer: Self-pay | Admitting: General Surgery

## 2021-04-20 DIAGNOSIS — L247 Irritant contact dermatitis due to plants, except food: Secondary | ICD-10-CM | POA: Diagnosis not present

## 2021-04-20 DIAGNOSIS — C20 Malignant neoplasm of rectum: Secondary | ICD-10-CM | POA: Diagnosis not present

## 2021-04-20 DIAGNOSIS — R030 Elevated blood-pressure reading, without diagnosis of hypertension: Secondary | ICD-10-CM | POA: Diagnosis not present

## 2021-04-20 MED ORDER — ENSURE PRE-SURGERY PO LIQD: 296.0000 mL | Freq: Once | ORAL | Status: DC

## 2021-04-20 NOTE — Progress Notes (Signed)

## 2021-04-23 ENCOUNTER — Other Ambulatory Visit (HOSPITAL_COMMUNITY)
Admission: RE | Admit: 2021-04-23 | Discharge: 2021-04-23 | Disposition: A | Discharge status: Home / Self Care | Payer: PPO | Source: Ambulatory Visit | Attending: General Surgery | Admitting: General Surgery

## 2021-04-23 DIAGNOSIS — Z20822 Contact with and (suspected) exposure to covid-19: Secondary | ICD-10-CM | POA: Diagnosis not present

## 2021-04-23 DIAGNOSIS — Z01812 Encounter for preprocedural laboratory examination: Secondary | ICD-10-CM | POA: Insufficient documentation

## 2021-04-23 LAB — SARS CORONAVIRUS 2 (TAT 6-24 HRS): SARS Coronavirus 2: NEGATIVE

## 2021-04-24 ENCOUNTER — Ambulatory Visit
Admission: RE | Admit: 2021-04-24 | Discharge: 2021-04-24 | Disposition: A | Discharge status: Home / Self Care | Payer: PPO | Source: Ambulatory Visit | Attending: General Surgery | Admitting: General Surgery

## 2021-04-24 ENCOUNTER — Other Ambulatory Visit: Payer: Self-pay

## 2021-04-24 DIAGNOSIS — D0512 Intraductal carcinoma in situ of left breast: Secondary | ICD-10-CM

## 2021-04-27 ENCOUNTER — Ambulatory Visit (HOSPITAL_BASED_OUTPATIENT_CLINIC_OR_DEPARTMENT_OTHER)
Admission: RE | Admit: 2021-04-27 | Discharge: 2021-04-27 | Disposition: A | Discharge status: Home / Self Care | Payer: PPO | Attending: General Surgery | Admitting: General Surgery

## 2021-04-27 ENCOUNTER — Encounter (HOSPITAL_BASED_OUTPATIENT_CLINIC_OR_DEPARTMENT_OTHER): Payer: Self-pay | Admitting: General Surgery

## 2021-04-27 ENCOUNTER — Ambulatory Visit (HOSPITAL_BASED_OUTPATIENT_CLINIC_OR_DEPARTMENT_OTHER): Payer: PPO | Admitting: Anesthesiology

## 2021-04-27 ENCOUNTER — Ambulatory Visit
Admission: RE | Admit: 2021-04-27 | Discharge: 2021-04-27 | Disposition: A | Discharge status: Home / Self Care | Payer: PPO | Source: Ambulatory Visit | Attending: General Surgery | Admitting: General Surgery

## 2021-04-27 ENCOUNTER — Encounter (HOSPITAL_BASED_OUTPATIENT_CLINIC_OR_DEPARTMENT_OTHER)
Admission: RE | Disposition: A | Discharge status: Home / Self Care | Payer: Self-pay | Source: Home / Self Care | Attending: General Surgery

## 2021-04-27 ENCOUNTER — Other Ambulatory Visit: Payer: Self-pay

## 2021-04-27 DIAGNOSIS — E039 Hypothyroidism, unspecified: Secondary | ICD-10-CM | POA: Insufficient documentation

## 2021-04-27 DIAGNOSIS — Z841 Family history of disorders of kidney and ureter: Secondary | ICD-10-CM | POA: Diagnosis not present

## 2021-04-27 DIAGNOSIS — I10 Essential (primary) hypertension: Secondary | ICD-10-CM | POA: Diagnosis not present

## 2021-04-27 DIAGNOSIS — Z87891 Personal history of nicotine dependence: Secondary | ICD-10-CM | POA: Insufficient documentation

## 2021-04-27 DIAGNOSIS — C50912 Malignant neoplasm of unspecified site of left female breast: Secondary | ICD-10-CM | POA: Diagnosis not present

## 2021-04-27 DIAGNOSIS — D0512 Intraductal carcinoma in situ of left breast: Secondary | ICD-10-CM

## 2021-04-27 DIAGNOSIS — Z85048 Personal history of other malignant neoplasm of rectum, rectosigmoid junction, and anus: Secondary | ICD-10-CM | POA: Diagnosis not present

## 2021-04-27 DIAGNOSIS — Z809 Family history of malignant neoplasm, unspecified: Secondary | ICD-10-CM | POA: Insufficient documentation

## 2021-04-27 DIAGNOSIS — E782 Mixed hyperlipidemia: Secondary | ICD-10-CM | POA: Diagnosis not present

## 2021-04-27 DIAGNOSIS — Z836 Family history of other diseases of the respiratory system: Secondary | ICD-10-CM | POA: Insufficient documentation

## 2021-04-27 DIAGNOSIS — Z170 Estrogen receptor positive status [ER+]: Secondary | ICD-10-CM | POA: Diagnosis not present

## 2021-04-27 HISTORY — PX: BREAST LUMPECTOMY WITH RADIOACTIVE SEED LOCALIZATION: SHX6424

## 2021-04-27 SURGERY — BREAST LUMPECTOMY WITH RADIOACTIVE SEED LOCALIZATION
Anesthesia: General | Site: Breast | Laterality: Left

## 2021-04-27 MED ORDER — FENTANYL CITRATE (PF) 100 MCG/2ML IJ SOLN: 25.0000 ug | INTRAMUSCULAR | Status: DC | PRN

## 2021-04-27 MED ORDER — OXYCODONE HCL 5 MG PO TABS
ORAL_TABLET | ORAL | Status: AC
  Filled 2021-04-27: qty 1

## 2021-04-27 MED ORDER — FENTANYL CITRATE (PF) 100 MCG/2ML IJ SOLN
INTRAMUSCULAR | Status: DC | PRN
  Administered 2021-04-27 (×4): 25 ug via INTRAVENOUS

## 2021-04-27 MED ORDER — PROPOFOL 10 MG/ML IV BOLUS
INTRAVENOUS | Status: DC | PRN
  Administered 2021-04-27: 50 mg via INTRAVENOUS
  Administered 2021-04-27: 100 mg via INTRAVENOUS
  Administered 2021-04-27: 20 mg via INTRAVENOUS

## 2021-04-27 MED ORDER — DEXAMETHASONE SODIUM PHOSPHATE 4 MG/ML IJ SOLN
INTRAMUSCULAR | Status: DC | PRN
  Administered 2021-04-27: 6 mg via INTRAVENOUS

## 2021-04-27 MED ORDER — ONDANSETRON HCL 4 MG/2ML IJ SOLN
INTRAMUSCULAR | Status: AC
  Filled 2021-04-27: qty 2

## 2021-04-27 MED ORDER — LIDOCAINE 2% (20 MG/ML) 5 ML SYRINGE
INTRAMUSCULAR | Status: DC | PRN
  Administered 2021-04-27: 60 mg via INTRAVENOUS

## 2021-04-27 MED ORDER — ONDANSETRON HCL 4 MG/2ML IJ SOLN
INTRAMUSCULAR | Status: DC | PRN
  Administered 2021-04-27: 4 mg via INTRAVENOUS

## 2021-04-27 MED ORDER — CEFAZOLIN SODIUM-DEXTROSE 2-4 GM/100ML-% IV SOLN
2.0000 g | INTRAVENOUS | Status: AC
  Administered 2021-04-27: 2 g via INTRAVENOUS

## 2021-04-27 MED ORDER — ONDANSETRON HCL 4 MG/2ML IJ SOLN: 4.0000 mg | Freq: Once | INTRAMUSCULAR | Status: DC | PRN

## 2021-04-27 MED ORDER — FENTANYL CITRATE (PF) 100 MCG/2ML IJ SOLN
INTRAMUSCULAR | Status: AC
  Filled 2021-04-27: qty 2

## 2021-04-27 MED ORDER — ACETAMINOPHEN 500 MG PO TABS
1000.0000 mg | ORAL_TABLET | ORAL | Status: AC
  Administered 2021-04-27: 1000 mg via ORAL

## 2021-04-27 MED ORDER — BUPIVACAINE HCL (PF) 0.25 % IJ SOLN
INTRAMUSCULAR | Status: DC | PRN
  Administered 2021-04-27: 10 mL

## 2021-04-27 MED ORDER — CEFAZOLIN SODIUM-DEXTROSE 2-4 GM/100ML-% IV SOLN
INTRAVENOUS | Status: AC
  Filled 2021-04-27: qty 100

## 2021-04-27 MED ORDER — OXYCODONE HCL 5 MG/5ML PO SOLN
5.0000 mg | Freq: Once | ORAL | Status: AC | PRN
Start: 2021-04-27 — End: 2021-04-27

## 2021-04-27 MED ORDER — PROPOFOL 10 MG/ML IV BOLUS
INTRAVENOUS | Status: AC
  Filled 2021-04-27: qty 20

## 2021-04-27 MED ORDER — OXYCODONE HCL 5 MG PO TABS
5.0000 mg | ORAL_TABLET | Freq: Once | ORAL | Status: AC | PRN
  Administered 2021-04-27: 5 mg via ORAL

## 2021-04-27 MED ORDER — LACTATED RINGERS IV SOLN: INTRAVENOUS | Status: DC

## 2021-04-27 MED ORDER — ACETAMINOPHEN 500 MG PO TABS
ORAL_TABLET | ORAL | Status: AC
  Filled 2021-04-27: qty 2

## 2021-04-27 SURGICAL SUPPLY — 57 items
ADH SKN CLS APL DERMABOND .7 (GAUZE/BANDAGES/DRESSINGS) ×1
APL PRP STRL LF DISP 70% ISPRP (MISCELLANEOUS) ×1
APPLIER CLIP 9.375 MED OPEN (MISCELLANEOUS)
APR CLP MED 9.3 20 MLT OPN (MISCELLANEOUS)
BINDER BREAST LRG (GAUZE/BANDAGES/DRESSINGS) ×1 IMPLANT
BINDER BREAST MEDIUM (GAUZE/BANDAGES/DRESSINGS) IMPLANT
BINDER BREAST XLRG (GAUZE/BANDAGES/DRESSINGS) IMPLANT
BINDER BREAST XXLRG (GAUZE/BANDAGES/DRESSINGS) IMPLANT
BLADE SURG 15 STRL LF DISP TIS (BLADE) ×1 IMPLANT
BLADE SURG 15 STRL SS (BLADE) ×2
CANISTER SUC SOCK COL 7IN (MISCELLANEOUS) IMPLANT
CANISTER SUCT 1200ML W/VALVE (MISCELLANEOUS) IMPLANT
CHLORAPREP W/TINT 26 (MISCELLANEOUS) ×2 IMPLANT
CLIP APPLIE 9.375 MED OPEN (MISCELLANEOUS) IMPLANT
CLIP VESOCCLUDE SM WIDE 6/CT (CLIP) ×1 IMPLANT
COVER BACK TABLE 60X90IN (DRAPES) ×2 IMPLANT
COVER MAYO STAND STRL (DRAPES) ×2 IMPLANT
COVER PROBE W GEL 5X96 (DRAPES) ×2 IMPLANT
COVER WAND RF STERILE (DRAPES) IMPLANT
DECANTER SPIKE VIAL GLASS SM (MISCELLANEOUS) IMPLANT
DERMABOND ADVANCED (GAUZE/BANDAGES/DRESSINGS) ×1
DERMABOND ADVANCED .7 DNX12 (GAUZE/BANDAGES/DRESSINGS) ×1 IMPLANT
DRAPE LAPAROSCOPIC ABDOMINAL (DRAPES) ×2 IMPLANT
DRAPE UTILITY XL STRL (DRAPES) ×2 IMPLANT
DRSG TEGADERM 4X4.75 (GAUZE/BANDAGES/DRESSINGS) IMPLANT
ELECT COATED BLADE 2.86 ST (ELECTRODE) ×2 IMPLANT
ELECT REM PT RETURN 9FT ADLT (ELECTROSURGICAL) ×2
ELECTRODE REM PT RTRN 9FT ADLT (ELECTROSURGICAL) ×1 IMPLANT
GAUZE SPONGE 4X4 12PLY STRL LF (GAUZE/BANDAGES/DRESSINGS) IMPLANT
GLOVE SURG ENC MOIS LTX SZ7 (GLOVE) ×4 IMPLANT
GLOVE SURG UNDER POLY LF SZ7.5 (GLOVE) ×2 IMPLANT
GOWN STRL REUS W/ TWL LRG LVL3 (GOWN DISPOSABLE) ×2 IMPLANT
GOWN STRL REUS W/TWL LRG LVL3 (GOWN DISPOSABLE) ×4
HEMOSTAT ARISTA ABSORB 3G PWDR (HEMOSTASIS) IMPLANT
KIT MARKER MARGIN INK (KITS) ×2 IMPLANT
NDL HYPO 25X1 1.5 SAFETY (NEEDLE) ×1 IMPLANT
NEEDLE HYPO 25X1 1.5 SAFETY (NEEDLE) ×2 IMPLANT
NS IRRIG 1000ML POUR BTL (IV SOLUTION) IMPLANT
PACK BASIN DAY SURGERY FS (CUSTOM PROCEDURE TRAY) ×2 IMPLANT
PENCIL SMOKE EVACUATOR (MISCELLANEOUS) ×2 IMPLANT
RETRACTOR ONETRAX LX 90X20 (MISCELLANEOUS) IMPLANT
SLEEVE SCD COMPRESS KNEE MED (STOCKING) ×2 IMPLANT
SPONGE LAP 4X18 RFD (DISPOSABLE) ×2 IMPLANT
STRIP CLOSURE SKIN 1/2X4 (GAUZE/BANDAGES/DRESSINGS) ×2 IMPLANT
SUT MNCRL AB 4-0 PS2 18 (SUTURE) ×2 IMPLANT
SUT MON AB 5-0 PS2 18 (SUTURE) ×1 IMPLANT
SUT SILK 2 0 SH (SUTURE) ×1 IMPLANT
SUT VIC AB 2-0 SH 27 (SUTURE) ×4
SUT VIC AB 2-0 SH 27XBRD (SUTURE) ×1 IMPLANT
SUT VIC AB 3-0 SH 27 (SUTURE) ×2
SUT VIC AB 3-0 SH 27X BRD (SUTURE) ×1 IMPLANT
SUT VIC AB 5-0 PS2 18 (SUTURE) IMPLANT
SYR CONTROL 10ML LL (SYRINGE) ×2 IMPLANT
TOWEL GREEN STERILE FF (TOWEL DISPOSABLE) ×2 IMPLANT
TRAY FAXITRON CT DISP (TRAY / TRAY PROCEDURE) ×2 IMPLANT
TUBE CONNECTING 20X1/4 (TUBING) IMPLANT
YANKAUER SUCT BULB TIP NO VENT (SUCTIONS) IMPLANT

## 2021-04-27 NOTE — Anesthesia Procedure Notes (Signed)
Procedure Name: LMA Insertion Performed by: Ezequiel Kayser, CRNA Pre-anesthesia Checklist: Patient identified, Emergency Drugs available, Suction available and Patient being monitored Patient Re-evaluated:Patient Re-evaluated prior to induction Oxygen Delivery Method: Circle System Utilized Preoxygenation: Pre-oxygenation with 100% oxygen Induction Type: IV induction Ventilation: Mask ventilation without difficulty LMA: LMA inserted LMA Size: 4.0 Number of attempts: 1 Airway Equipment and Method: Bite block Placement Confirmation: positive ETCO2 Tube secured with: Tape Dental Injury: Teeth and Oropharynx as per pre-operative assessment

## 2021-04-27 NOTE — Anesthesia Postprocedure Evaluation (Signed)
Anesthesia Post Note  Patient: Kathleen Flores  Procedure(s) Performed: LEFT BREAST LUMPECTOMY WITH RADIOACTIVE SEED LOCALIZATION (Left Breast)     Patient location during evaluation: PACU Anesthesia Type: General Level of consciousness: awake and alert Pain management: pain level controlled Vital Signs Assessment: post-procedure vital signs reviewed and stable Respiratory status: spontaneous breathing, nonlabored ventilation and respiratory function stable Cardiovascular status: blood pressure returned to baseline and stable Postop Assessment: no apparent nausea or vomiting Anesthetic complications: no   No complications documented.  Last Vitals:  Vitals:   04/27/21 1615 04/27/21 1639  BP: (!) 175/91 (!) 189/78  Pulse: 87 77  Resp: 17 16  Temp:  37 C  SpO2: 96% 96%    Last Pain:  Vitals:   04/27/21 1639  TempSrc:   PainSc: 4                  Lidia Collum

## 2021-04-27 NOTE — H&P (Signed)
75 yof with history of rectal cancer who had screening mm that shows a left breast mass. this is at 3 oclock and measures on [place] 1.2x0.9x0.6 cm. ax [place] is negative. 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 she has no mass or dc noted. she is here with her husband to discuss options  Past Surgical History Conni Slipper, RN; 04/15/2021 8:05 AM) Breast Biopsy  Left. Hysterectomy (not due to cancer) - Complete  Resection of Small Bowel   Diagnostic Studies History Conni Slipper, RN; 04/15/2021 8:05 AM) Colonoscopy  within last year Mammogram  within last year Pap Smear  >5 years ago  Medication History Conni Slipper, RN; 04/15/2021 8:05 AM) Medications Reconciled  Social History Conni Slipper, RN; 04/15/2021 8:05 AM) No alcohol use  No caffeine use  No drug use  Tobacco use  Former smoker.  Family History Conni Slipper, RN; 04/15/2021 8:05 AM) Alcohol Abuse  Father. Cancer  Mother. Heart Disease  Father. Kidney Disease  Family Members In General. Respiratory Condition  Mother.  Pregnancy / Birth History Conni Slipper, RN; 04/15/2021 8:05 AM) Age at menarche  59 years. Age of menopause  <45 29-55 Gravida  1 Irregular periods  Maternal age  92-25 Para  4  Other Problems Conni Slipper, RN; 04/15/2021 8:05 AM) Breast Cancer  Colon Cancer  Heart murmur  Rectal Cancer  Thyroid Disease   Review of Systems Conni Slipper RN; 04/15/2021 8:05 AM) General Not Present- Appetite Loss, Chills, Fatigue, Fever, Night Sweats, Weight Gain and Weight Loss. Skin Not Present- Change in Wart/Mole, Dryness, Hives, Jaundice, New Lesions, Non-Healing Wounds, Rash and Ulcer. HEENT Present- Hearing Loss and Wears glasses/contact lenses. Not Present- Earache, Hoarseness, Nose Bleed, Oral Ulcers, Ringing in the Ears, Seasonal Allergies, Sinus Pain, Sore Throat, Visual Disturbances and Yellow Eyes. Respiratory Not Present- Bloody sputum, Chronic Cough, Difficulty  Breathing, Snoring and Wheezing. Breast Not Present- Breast Mass, Breast Pain, Nipple Discharge and Skin Changes. Cardiovascular Not Present- Chest Pain, Difficulty Breathing Lying Down, Leg Cramps, Palpitations, Rapid Heart Rate, Shortness of Breath and Swelling of Extremities. Female Genitourinary Not Present- Frequency, Nocturia, Painful Urination, Pelvic Pain and Urgency. Musculoskeletal Present- Joint Pain. Not Present- Back Pain, Joint Stiffness, Muscle Pain, Muscle Weakness and Swelling of Extremities. Neurological Not Present- Decreased Memory, Fainting, Headaches, Numbness, Seizures, Tingling, Tremor, Trouble walking and Weakness. Psychiatric Not Present- Anxiety, Bipolar, Change in Sleep Pattern, Depression, Fearful and Frequent crying. Endocrine Not Present- Cold Intolerance, Excessive Hunger, Hair Changes, Heat Intolerance, Hot flashes and New Diabetes. Hematology Not Present- Blood Thinners, Easy Bruising, Excessive bleeding, Gland problems, HIV and Persistent Infections.   Physical Exam Rolm Bookbinder MD; 04/15/2021 6:55 PM) General Mental Status-Alert. Orientation-Oriented X3. Breast Nipples-No Discharge. Breast Lump-No Palpable Breast Mass. Lymphatic Head & Neck General Head & Neck Lymphatics: Bilateral - Description - Normal. Axillary General Axillary Region: Bilateral - Description - Normal. Note: no Vernon adenopathy   Assessment & Plan Rolm Bookbinder MD; 04/15/2021 6:54 PM) BREAST NEOPLASM, TIS (DCIS), LEFT (D05.12) Story: Left breast seed guided lumpectomy We discussed the staging and pathophysiology of breast cancer. We discussed all of the different options for treatment for breast cancer including surgery, chemotherapy, radiation therapy, Herceptin, and antiestrogen therapy. We discussed noninvasive nature of this lesion. Not a candidate for COMET. She does not need sentinel node biopsy. We discussed the options for treatment of the breast cancer which  included lumpectomy versus a mastectomy. We discussed the performance of the lumpectomy with radioactive  seed placement. We discussed a 5-10% chance of a positive margin requiring reexcision in the operating room. We also discussed that she might need radiation therapy if she undergoes lumpectomy. We discussed mastectomy and the postoperative care for that as well. Mastectomy can be followed by reconstruction. We discussed that there is no difference in her survival whether she undergoes lumpectomy with radiation therapy or antiestrogen therapy versus a mastectomy. There is also no real difference between her recurrence in the breast. We discussed the risks of operation including bleeding, infection, possible reoperation. She understands her further therapy will be based on what her stages at the time of her operation.

## 2021-04-27 NOTE — Op Note (Signed)
Preoperative diagnosis: Clinical stage 0 left breast cancer Postoperative diagnosis: Same as above Procedure:Left breast radioactive seed guided lumpectomy Surgeon: Dr. Serita Grammes Anesthesia: General with a pectoral block Estimated blood loss: Minimal Complications: None Drains: None Specimens: 1. Left breast tissue marked with paint containing seed and clip 2. Additional inferior, posterior, and lateral margins marked short superior, long lateral, double deep Sponge count was correct at completion Disposition to recovery stable addition  Indications:74 yof with history of rectal cancer who had screening mm that shows a left breast mass. this is at 3 oclock and measures on [place] 1.2x0.9x0.6 cm. ax [place] is negative. 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 We discussed lumpectomy.  Procedure: After informed consent was obtained the patient was taken to the OR. She had SCDs in place. She was given antibiotics. She was then placed under general anesthesia without complication. She was prepped and draped in the standard sterile surgical fashion. Surgical timeout was then performed.  I infiltrated marcaine in the lateral breast and then made a periareolar incision to hide the scar. I dissected to the seed. I then removed the seed and the surrounding tissue with an attempt to get a clear margin.  I did 3D imaging and I thought several margins might be close so I removed those. The posterior margin is the muscle now and the anterior is really skin.   I then obtained hemostasis. I closed the breast tissue with 2-0 vicryl .The skin was closed with 3-0 Vicryl and 4-0 Monocryl. Glue and Steri-Strips were applied. She tolerated this well was extubated transferred to recovery stable.

## 2021-04-27 NOTE — Discharge Instructions (Signed)
[facility] Office Phone Number [phone]  BREAST BIOPSY/ PARTIAL MASTECTOMY: POST OP INSTRUCTIONS Take 400 mg of ibuprofen every 8 hours or 650 mg tylenol every 6 hours for next 72 hours then as needed. Use ice several times daily also. Always review your discharge instruction sheet given to you by the facility where your surgery was performed.  IF YOU HAVE DISABILITY OR FAMILY LEAVE FORMS, YOU MUST BRING THEM TO THE OFFICE FOR PROCESSING.  DO NOT GIVE THEM TO YOUR DOCTOR.  1. A prescription for pain medication may be given to you upon discharge.  Take your pain medication as prescribed, if needed.  If narcotic pain medicine is not needed, then you may take acetaminophen (Tylenol), naprosyn (Alleve) or ibuprofen (Advil) as needed. 2. Take your usually prescribed medications unless otherwise directed 3. If you need a refill on your pain medication, please contact your pharmacy.  They will contact our office to request authorization.  Prescriptions will not be filled after 5pm or on week-ends. 4. You should eat very light the first 24 hours after surgery, such as soup, crackers, pudding, etc.  Resume your normal diet the day after surgery. 5. Most patients will experience some swelling and bruising in the breast.  Ice packs and a good support bra will help.  Wear the breast binder provided or a sports bra for 72 hours day and night.  After that wear a sports bra during the day until you return to the office. Swelling and bruising can take several days to resolve.  6. It is common to experience some constipation if taking pain medication after surgery.  Increasing fluid intake and taking a stool softener will usually help or prevent this problem from occurring.  A mild laxative (Milk of Magnesia or Miralax) should be taken according to package directions if there are no bowel movements after 48 hours. 7. Unless discharge instructions indicate otherwise, you may remove your bandages 48  hours after surgery and you may shower at that time.  You may have steri-strips (small skin tapes) in place directly over the incision.  These strips should be left on the skin for 7-10 days and will come off on their own.  If your surgeon used skin glue on the incision, you may shower in 24 hours.  The glue will flake off over the next 2-3 weeks.  Any sutures or staples will be removed at the office during your follow-up visit. 8. ACTIVITIES:  You may resume regular daily activities (gradually increasing) beginning the next day.  Wearing a good support bra or sports bra minimizes pain and swelling.  You may have sexual intercourse when it is comfortable. a. You may drive when you no longer are taking prescription pain medication, you can comfortably wear a seatbelt, and you can safely maneuver your car and apply brakes. b. RETURN TO WORK:  ______________________________________________________________________________________ 9. You should see your doctor in the office for a follow-up appointment approximately two weeks after your surgery.  Your doctor's nurse will typically make your follow-up appointment when she calls you with your pathology report.  Expect your pathology report 3-4 business days after your surgery.  You may call to check if you do not hear from us after three days. 10. OTHER INSTRUCTIONS: _______________________________________________________________________________________________ _____________________________________________________________________________________________________________________________________ _____________________________________________________________________________________________________________________________________ _____________________________________________________________________________________________________________________________________  WHEN TO CALL DR WAKEFIELD: 1. Fever over 101.0 2. Nausea and/or vomiting. 3. Extreme swelling or  bruising. 4. Continued bleeding from incision. 5. Increased pain, redness, or drainage from the incision.  The clinic   staff is available to answer your questions during regular business hours.  Please don't hesitate to call and ask to speak to one of the nurses for clinical concerns.  If you have a medical emergency, go to the nearest emergency room or call 911.  A surgeon from [facility] is always on call at the hospital.  For further questions, please visit [email or web address]  No Tylenol until 7:46 pm   Post Anesthesia Home Care Instructions  Activity: Get plenty of rest for the remainder of the day. A responsible individual must stay with you for 24 hours following the procedure.  For the next 24 hours, DO NOT: -Drive a car -Paediatric nurse -Drink alcoholic beverages -Take any medication unless instructed by your physician -Make any legal decisions or sign important papers.  Meals: Start with liquid foods such as gelatin or soup. Progress to regular foods as tolerated. Avoid greasy, spicy, heavy foods. If nausea and/or vomiting occur, drink only clear liquids until the nausea and/or vomiting subsides. Call your physician if vomiting continues.  Special Instructions/Symptoms: Your throat may feel dry or sore from the anesthesia or the breathing tube placed in your throat during surgery. If this causes discomfort, gargle with warm salt water. The discomfort should disappear within 24 hours.  If you had a scopolamine patch placed behind your ear for the management of post- operative nausea and/or vomiting:  1. The medication in the patch is effective for 72 hours, after which it should be removed.  Wrap patch in a tissue and discard in the trash. Wash hands thoroughly with soap and water. 2. You may remove the patch earlier than 72 hours if you experience unpleasant side effects which may include dry mouth, dizziness or visual disturbances. 3. Avoid  touching the patch. Wash your hands with soap and water after contact with the patch.

## 2021-04-27 NOTE — Anesthesia Preprocedure Evaluation (Addendum)
Anesthesia Evaluation  Patient identified by MRN, date of birth, ID band Patient awake    Reviewed: Allergy & Precautions, NPO status , Patient's Chart, lab work & pertinent test results  Airway Mallampati: II  TM Distance: >3 FB Neck ROM: Full    Dental  (+) Teeth Intact, Dental Advisory Given   Pulmonary neg pulmonary ROS, former smoker,    Pulmonary exam normal        Cardiovascular hypertension, Normal cardiovascular exam     Neuro/Psych PSYCHIATRIC DISORDERS Anxiety White coat HTNnegative neurological ROS     GI/Hepatic Neg liver ROS, Hx CRC s/p LAR 06/2019, ostomy closure 11/2019   Endo/Other  Hypothyroidism   Renal/GU negative Renal ROS  negative genitourinary   Musculoskeletal negative musculoskeletal ROS (+)   Abdominal   Peds  Hematology negative hematology ROS (+) hct 37.1   Anesthesia Other Findings L breast DCIS  Reproductive/Obstetrics negative OB ROS                            Anesthesia Physical Anesthesia Plan  ASA: III  Anesthesia Plan: General   Post-op Pain Management:    Induction: Intravenous  PONV Risk Score and Plan: 3 and Ondansetron, Dexamethasone, Treatment may vary due to age or medical condition and Midazolam  Airway Management Planned: LMA  Additional Equipment: None  Intra-op Plan:   Post-operative Plan: Extubation in OR  Informed Consent: I have reviewed the patients History and Physical, chart, labs and discussed the procedure including the risks, benefits and alternatives for the proposed anesthesia with the patient or authorized representative who has indicated his/her understanding and acceptance.     Dental advisory given  Plan Discussed with:   Anesthesia Plan Comments:        Anesthesia Quick Evaluation

## 2021-04-27 NOTE — Interval H&P Note (Signed)
History and Physical Interval Note:  04/27/2021 2:39 PM  Kathleen Flores  has presented today for surgery, with the diagnosis of LEFT BREAST DCIS.  The various methods of treatment have been discussed with the patient and family. After consideration of risks, benefits and other options for treatment, the patient has consented to  Procedure(s): LEFT BREAST LUMPECTOMY WITH RADIOACTIVE SEED LOCALIZATION (Left) as a surgical intervention.  The patient's history has been reviewed, patient examined, no change in status, stable for surgery.  I have reviewed the patient's chart and labs.  Questions were answered to the patient's satisfaction.     Rolm Bookbinder

## 2021-04-27 NOTE — Transfer of Care (Signed)
Immediate Anesthesia Transfer of Care Note  Patient: Kathleen Flores  Procedure(s) Performed: LEFT BREAST LUMPECTOMY WITH RADIOACTIVE SEED LOCALIZATION (Left Breast)  Patient Location: PACU  Anesthesia Type:General  Level of Consciousness: drowsy  Airway & Oxygen Therapy: Patient Spontanous Breathing and Patient connected to face mask oxygen  Post-op Assessment: Report given to RN and Post -op Vital signs reviewed and stable  Post vital signs: Reviewed and stable  Last Vitals:  Vitals Value Taken Time  BP 167/74 04/27/21 1555  Temp    Pulse 88 04/27/21 1557  Resp 15 04/27/21 1557  SpO2 100 % 04/27/21 1557  Vitals shown include unvalidated device data.  Last Pain:  Vitals:   04/27/21 1352  TempSrc: Oral  PainSc: 0-No pain         Complications: No complications documented.

## 2021-04-29 ENCOUNTER — Encounter: Payer: Self-pay | Admitting: General Practice

## 2021-04-29 DIAGNOSIS — Z433 Encounter for attention to colostomy: Secondary | ICD-10-CM | POA: Diagnosis not present

## 2021-04-29 NOTE — Progress Notes (Signed)
[facility] Spiritual Care Note  Followed up with Kathleen Flores by phone as planned. She was in good spirits after her lumpectomy on Monday, noting less pain today and appreciating the ability to get out of the house to support her morale. She used the opportunity well to share and process her mild anxiety about getting pathology results on Friday and values continued check-ins. We plan to talk again in ca two weeks.   [place], [place], [facility] Pager [phone] Voicemail [phone]

## 2021-05-01 ENCOUNTER — Encounter (HOSPITAL_BASED_OUTPATIENT_CLINIC_OR_DEPARTMENT_OTHER): Payer: Self-pay | Admitting: General Surgery

## 2021-05-01 LAB — SURGICAL PATHOLOGY

## 2021-05-04 ENCOUNTER — Encounter: Payer: Self-pay | Admitting: *Deleted

## 2021-05-04 NOTE — Progress Notes (Signed)
Patient Care Team: [name], [place] as PCP - General (Nurse Practitioner) Michael Boston, MD as Consulting Physician (General Surgery) Milus Banister, MD as Consulting Physician (Gastroenterology) Ladell Pier, MD as Consulting Physician (Oncology) Rockwell Germany, RN as Oncology Nurse Navigator Mauro Kaufmann, RN as Oncology Nurse Navigator Rolm Bookbinder, MD as Consulting Physician (General Surgery) Kathleen Lose, MD as Consulting Physician (Hematology and Oncology) Kyung Rudd, MD as Consulting Physician (Radiation Oncology)  DIAGNOSIS:    ICD-10-CM   1. Ductal carcinoma in situ (DCIS) of left breast  D05.12     SUMMARY OF ONCOLOGIC HISTORY: Oncology History  Ductal carcinoma in situ (DCIS) of left breast  04/07/2021 Initial Diagnosis   Screening mammogram showed a left breast mass. Diagnostic mammogram and US showed a 1.2cm mass at the 3 o'clock position in the left breast and no left axillary adenopathy. Biopsy showed low to intermediate grade DCIS, ER/PR+ 95%.    04/27/2021 Surgery   Left lumpectomy Kathleen Flores): DCIS partially involving a papillary lesion, 1.8cm, clear margins.  ER 95%, PR 95% Additional inferior margin: 1.2 cm DCIS,  Additional left lateral margin: 1.4 cm DCIS      CHIEF COMPLIANT: Follow-up s/p left lumpectomy   INTERVAL HISTORY: Kathleen Flores is a 75 y.o. with above-mentioned history of DCIS of the left breast. She underwent a left lumpectomy with Dr. Donne Flores on 04/27/21 for which pathology showed DCIS partially involving a papillary lesion, 1.8cm, clear margins. She presents to the clinic today to discuss the pathology report and further treatment.   ALLERGIES:  is allergic to metoprolol, statins, and demerol [meperidine hcl].  MEDICATIONS:  Current Outpatient Medications  Medication Sig Dispense Refill  . ascorbic acid (VITAMIN C) 500 MG tablet Take 500 mg by mouth daily.    . Calcium Carb-Cholecalciferol (CALCIUM+D3) 600-800  MG-UNIT TABS Take 1 tablet by mouth daily.    . Cyanocobalamin (VITAMIN B-12) 5000 MCG SUBL Place 5,000 mcg under the tongue daily.    Marland Kitchen dicyclomine (BENTYL) 10 MG capsule Take 1 capsule (10 mg total) by mouth 2 (two) times daily as needed for spasms. 60 capsule 6  . levothyroxine (SYNTHROID, LEVOTHROID) 125 MCG tablet Take 125 mcg by mouth daily before breakfast.    . zinc gluconate 50 MG tablet Take 50 mg by mouth daily.     No current facility-administered medications for this visit.    PHYSICAL EXAMINATION: ECOG PERFORMANCE STATUS: 1 - Symptomatic but completely ambulatory  Vitals:   05/05/21 1055  BP: (!) 175/70  Pulse: 97  Resp: 18  Temp: 97.9 F (36.6 C)  SpO2: 100%   Filed Weights   05/05/21 1055  Weight: 128 lb 8 oz (58.3 kg)    LABORATORY DATA:  I have reviewed the data as listed CMP Latest Ref Rng & Units 04/15/2021 09/25/2020 09/16/2020  Glucose 70 - 99 mg/dL 127(H) 134(H) -  BUN 8 - 23 mg/dL 14 13 -  Creatinine 0.44 - 1.00 mg/dL 0.73 0.47 -  Sodium 135 - 145 mmol/L 141 139 -  Potassium 3.5 - 5.1 mmol/L 3.7 3.6 -  Chloride 98 - 111 mmol/L 103 101 -  CO2 22 - 32 mmol/L 28 28 -  Calcium 8.9 - 10.3 mg/dL 9.8 9.2 -  Total Protein 6.5 - 8.1 g/dL 7.8 8.0 7.7  Total Bilirubin 0.3 - 1.2 mg/dL 0.9 0.7 0.4  Alkaline Phos 38 - 126 U/L 93 82 76  AST 15 - 41 U/L 21 17 16   ALT 0 -  44 U/L 16 13 10     Lab Results  Component Value Date   WBC 5.5 04/15/2021   HGB 12.3 04/15/2021   HCT 37.1 04/15/2021   MCV 93.2 04/15/2021   PLT 233 04/15/2021   NEUTROABS 3.6 04/15/2021    ASSESSMENT & PLAN:  Ductal carcinoma in situ (DCIS) of left breast 04/27/2021:Left lumpectomy Kathleen Flores): DCIS partially involving a papillary lesion, 1.8cm, clear margins.  Additional inferior margin: 1.2 cm DCIS, additional left lateral margin: 1.4 cm DCIS ER 95%, PR 95%  Pathology counseling: I discussed the final pathology report of the patient provided  a copy of this report. I discussed the  margins. We also discussed the final staging along with previously performed ER/PR  Testing.  Treatment plan: Patient does not want to radiation because of her prior bad experience for her rectal cancer. She also does not want to receive any antiestrogen therapy because she is concerned about adverse effects.  She does not always try and see how she does.  She has made up her mind on that.  We discussed follow-ups and decided that she could like to follow with Dr. Benay Flores who sees her every 6 months.  Return to clinic on an as-needed basis.  No orders of the defined types were placed in this encounter.  The patient has a good understanding of the overall plan. she agrees with it. she will call with any problems that may develop before the next visit here.  Total time spent: 30 mins including face to face time and time spent for planning, charting and coordination of care  Kathleen Eisenmenger, MD, MPH 05/05/2021  I, Kathleen Flores, am acting as scribe for Dr. Nicholas Flores.  I have reviewed the above documentation for accuracy and completeness, and I agree with the above.

## 2021-05-05 ENCOUNTER — Other Ambulatory Visit: Payer: Self-pay

## 2021-05-05 ENCOUNTER — Inpatient Hospital Stay: Payer: PPO | Admitting: Hematology and Oncology

## 2021-05-05 ENCOUNTER — Encounter: Payer: Self-pay | Admitting: *Deleted

## 2021-05-05 DIAGNOSIS — D0512 Intraductal carcinoma in situ of left breast: Secondary | ICD-10-CM

## 2021-05-05 NOTE — Assessment & Plan Note (Signed)
04/27/2021:Left lumpectomy [name]): DCIS partially involving a papillary lesion, 1.8cm, clear margins.  Additional inferior margin: 1.2 cm DCIS, additional left lateral margin: 1.4 cm DCIS ER 95%, PR 95%  Pathology counseling: I discussed the final pathology report of the patient provided  a copy of this report. I discussed the margins. We also discussed the final staging along with previously performed ER/PR  Testing.  Treatment plan: Patient does not want to radiation because of her prior bad experience for her rectal cancer. Recommend antiestrogen therapy with tamoxifen x5 years.  (Plan to start at 10 mg) patient and her husband are concerned about thromboembolic adverse effects of tamoxifen.  We also briefly discussed anastrozole as an option.  Return to clinic in 3 months for survivorship care plan visit

## 2021-05-12 ENCOUNTER — Inpatient Hospital Stay: Payer: PPO

## 2021-05-12 ENCOUNTER — Other Ambulatory Visit: Payer: PPO

## 2021-05-12 ENCOUNTER — Ambulatory Visit: Payer: PPO | Admitting: Oncology

## 2021-05-12 ENCOUNTER — Inpatient Hospital Stay: Payer: PPO | Admitting: Oncology

## 2021-05-12 ENCOUNTER — Other Ambulatory Visit: Payer: Self-pay

## 2021-05-12 VITALS — BP 160/80 | HR 91 | Temp 97.8°F | Resp 20 | Ht 67.0 in | Wt 130.4 lb

## 2021-05-12 DIAGNOSIS — D0512 Intraductal carcinoma in situ of left breast: Secondary | ICD-10-CM | POA: Diagnosis not present

## 2021-05-12 DIAGNOSIS — C20 Malignant neoplasm of rectum: Secondary | ICD-10-CM

## 2021-05-12 LAB — CEA (ACCESS): CEA (CHCC): <1 ng/mL (ref 0.00–5.00)

## 2021-05-12 NOTE — Progress Notes (Signed)
[place] OFFICE PROGRESS NOTE   Diagnosis: Rectal cancer  INTERVAL HISTORY:   Kathleen Flores returns as scheduled.  She underwent creation of an end colostomy by Dr. Morton Stall on 11/17/2020.  This was due to a rectovaginal fistula.  She reports feeling better following surgery.  She continues to have intermittent diarrhea, but this has improved.  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  The surgical margins were close.  She has seen Drs. [name] and [place] and is considered a reexcision surgery.  She does not wish to receive adjuvant radiation.  She declined adjuvant hormonal therapy when she saw Dr. Lindi Adie on 05/05/2021.  Objective:  Vital signs in last 24 hours:  Blood pressure (!) 160/80, pulse 91, temperature 97.8 F (36.6 C), temperature source Oral, resp. rate 20, height 5\' 7"  (1.702 m), weight 130 lb 6.4 oz (59.1 kg), SpO2 97 %.     Lymphatics: No cervical, supraclavicular, axillary, or inguinal nodes Resp: Lungs clear bilaterally Cardio: Regular rate and rhythm GI: No hepatosplenomegaly, left lower quadrant colostomy, nontender, no mass Vascular: No leg edema Breast: Left breast incision with Steri-Strips in place    Lab Results:  Lab Results  Component Value Date   WBC 5.5 04/15/2021   HGB 12.3 04/15/2021   HCT 37.1 04/15/2021   MCV 93.2 04/15/2021   PLT 233 04/15/2021   NEUTROABS 3.6 04/15/2021    CMP  Lab Results  Component Value Date   NA 141 04/15/2021   K 3.7 04/15/2021   CL 103 04/15/2021   CO2 28 04/15/2021   GLUCOSE 127 (H) 04/15/2021   BUN 14 04/15/2021   CREATININE 0.73 04/15/2021   CALCIUM 9.8 04/15/2021   PROT 7.8 04/15/2021   ALBUMIN 4.3 04/15/2021   AST 21 04/15/2021   ALT 16 04/15/2021   ALKPHOS 93 04/15/2021   BILITOT 0.9 04/15/2021   GFRNONAA >60 04/15/2021   GFRAA >60 05/14/2020    Lab Results  Component Value Date   CEA1 <1.00 11/10/2020    Medications: I have reviewed the  patient's current medications.   Assessment/Plan: 1. Rectal cancer ? Nonobstructing mass in the mid rectum on colonoscopy 02/28/2019, 7 cm from the anal verge, biopsy confirmed adenocarcinoma-at least intramucosal ? CTs 03/05/2019-eccentric soft tissue thickening at the low rectum, multiple tiny lung nodules-nonspecific, 1 cm left adrenal nodule, no definite evidence of metastatic disease ? MR pelvis 03/08/2019-tumor at 6.5 cm from the internal anal sphincter, T3b,N1(single 7 mm right perirectal lymph node) ? Radiation/Xeloda 03/19/2019-04/27/2019, Xeloda completed 04/25/2019 ? CT abdomen/pelvis 06/22/2019-dilated ileum with diffuse wall thickening and mucosal enhancement, no rectal tumor seen, 0.9 cm right lower quadrant ileocolic node, no pelvic or inguinal adenopathy ? Low anterior resection, ileocecectomy, and diverting loop ileostomy 07/12/2019,[id number]-differentiated adenocarcinoma of the rectum, 1/22 lymph nodes positive, one tumor deposit, negative margins ? Cycle 1 Xeloda 08/20/2019 ? Cycle 2 Xeloda 09/10/2019 ? Cycle 3 Xeloda 09/29/2019 ? Cycle 4 Xeloda 10/18/2019, placed on hold beginning 10/22/2019 due to hand-foot syndrome, Xeloda resumed 10/23/2019 at a reduced dose of 1000 mg twice daily for the remainder of the cycle, discontinued 1116 secondary to burning of the hands and feet ? Cycle 5 Xeloda11/24/20-dose reduced to 500 mg twice daily(discontinued after 7 days due to hand-foot syndrome) ? Ileostomy reversal 12/10/2019 ? Colonoscopy 04/16/2020- right colon ileocolonic anastomosis normal, colorectal anastomosis at 1 cm from anal verge with a 5-6 mm deep ulcer- biopsy of ulcer with no adenomatous change or carcinoma ?  CTs 05/07/2020-contrast opacified presacral air/fluid collection communicating to the posterior rectum by fistula tract, unchanged tiny pulmonary nodules, no evidence of metastatic disease ? CT pelvis 09/03/2020- rectovaginal fistula, changes of early osteomyelitis at the  anterior sacrum ? CT abdomen/pelvis 09/26/2020- unchanged gas and debris in the presacral space, fistula not clearly depicted without contrast ? End colostomy 11/17/2020 ? MR pelvis 12/17/2020-improved presacral fluid collection, reduced edema bilateral piriformis musculature and presacral soft tissue  2. Hypertension 3. Anxiety 4. 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 Ardis Hughs and Dr. 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  DCIS- screening mammogram 03/10/2021-possible mass in the left breast  Diagnostic left mammogram and ultrasound 04/01/2021- indeterminate 1.2 cm upper outer left breast mass  Ultrasound-guided biopsy of indeterminate mass in the outer left breast 04/01/2021- DCIS, low to intermediate grade involving a papillary lesion, ER positive (95%), PR positive (95%)  Radioactive seed guided left lumpectomy 04/27/2021- DCIS involving a papillary lesion, 1.8 cm, DCIS 1.2 cm add additional left inferior margin, DCIS 1.4 cm and additional left lateral margin.  DCIS focally less than 0.1 cm from the medial margin, 0.2 cm from the final inferior margin, and 0.1 cm from the final lateral margin     Disposition: Kathleen Flores is in clinical remission from rectal cancer.  She underwent an end colostomy in December 2021.  She feels better following the colostomy procedure.  We will follow-up on the CEA from today. She has been diagnosed with left breast DCIS.  She is followed by Dr. [name] and Lindi Adie.  She plans to discuss the indication for a reexcision procedure with Dr. [name].  She does not wish to receive adjuvant radiation.  Her initial decision was to forego adjuvant hormonal therapy.  I recommended she consider adjuvant hormonal therapy if she does not undergo a  reexcision.  She will return for an office visit and CEA in 6 months.  We discussed the indication for surveillance imaging.  She does not wish to undergo imaging at present.  She has undergone multiple CTs over the past few years.   Betsy Coder, MD  05/12/2021  2:31 PM

## 2021-05-13 LAB — CEA (IN HOUSE-CHCC): CEA (CHCC-In House): 1.07 ng/mL (ref 0.00–5.00)

## 2021-05-14 ENCOUNTER — Telehealth: Payer: Self-pay

## 2021-05-14 NOTE — Telephone Encounter (Deleted)
-----   Message from Ladell Pier, MD sent at 05/12/2021  5:53 PM EDT ----- Please call patient, CEA is normal, follow-up as scheduled, please ask her to call us with the decision regarding additional breast surgery to obtain a wider margin

## 2021-05-14 NOTE — Telephone Encounter (Signed)
TC to Pt Per Dr Benay Spice to inform her of normal CEA results and to follow up as scheduled. Also inquired about her decision regarding additional breast surgery. Pt stated she is waiting to hear from Dr [name] to return her call and she will let Dr Benay Spice know the outcome as soon as she finds out.

## 2021-05-14 NOTE — Telephone Encounter (Signed)
-----   Message from Ladell Pier, MD sent at 05/12/2021  5:53 PM EDT ----- Please call patient, CEA is normal, follow-up as scheduled, please ask her to call us with the decision regarding additional breast surgery to obtain a wider margin

## 2021-05-18 ENCOUNTER — Other Ambulatory Visit: Payer: Self-pay | Admitting: General Surgery

## 2021-05-21 ENCOUNTER — Telehealth: Payer: Self-pay | Admitting: Hematology and Oncology

## 2021-05-21 NOTE — Telephone Encounter (Signed)
Scheduled appointment per 06/09 sch msg. Patient is aware. 

## 2021-05-21 NOTE — Assessment & Plan Note (Signed)
04/27/2021:Left lumpectomy Kathleen Flores): DCIS partially involving a papillary lesion, 1.8cm, clear margins.  Additional inferior margin: 1.2 cm DCIS, additional left lateral margin: 1.4 cm DCIS ER 95%, PR 95%  Treatment plan: Patient does not want to radiation because of her prior bad experience for her rectal cancer. She also does not want to receive any antiestrogen therapy because she is concerned about adverse effects.  She does not always try and see how she does.  She has made up her mind on that.  We discussed follow-ups and decided that she could like to follow with Dr. Benay Spice who sees her every 6 months.  Return to clinic on an as-needed basis.

## 2021-05-21 NOTE — Progress Notes (Signed)
Patient Care Team: [name], [place] as PCP - General (Nurse Practitioner) Michael Boston, MD as Consulting Physician (General Surgery) Milus Banister, MD as Consulting Physician (Gastroenterology) Ladell Pier, MD as Consulting Physician (Oncology) Rockwell Germany, RN as Oncology Nurse Navigator Mauro Kaufmann, RN as Oncology Nurse Navigator Rolm Bookbinder, MD as Consulting Physician (General Surgery) Nicholas Lose, MD as Consulting Physician (Hematology and Oncology) Kyung Rudd, MD as Consulting Physician (Radiation Oncology)  DIAGNOSIS:    ICD-10-CM   1. Ductal carcinoma in situ (DCIS) of left breast  D05.12       SUMMARY OF ONCOLOGIC HISTORY: Oncology History  Ductal carcinoma in situ (DCIS) of left breast  04/07/2021 Initial Diagnosis   Screening mammogram showed a left breast mass. Diagnostic mammogram and US showed a 1.2cm mass at the 3 o'clock position in the left breast and no left axillary adenopathy. Biopsy showed low to intermediate grade DCIS, ER/PR+ 95%.    04/27/2021 Surgery   Left lumpectomy Kathleen Flores): DCIS partially involving a papillary lesion, 1.8cm, clear margins.  ER 95%, PR 95% Additional inferior margin: 1.2 cm DCIS,  Additional left lateral margin: 1.4 cm DCIS      CHIEF COMPLIANT: Follow-up of left breast DCIS  INTERVAL HISTORY: Kathleen Flores is a 75 y.o. with above-mentioned history of DCIS of the left breast who underwent a left lumpectomy, declined radiation, and declined antiestrogen therapy. She presents to the clinic today for follow-up.  Previously she did not want to receive either radiation or antiestrogen therapy but after discussing with Dr. Donne Flores she is open to receiving antiestrogen therapy.  The concern is because of close margins and the multifocality of her DCIS, there may be residual DCIS still in the breast.  The best way to prevent relapses would be with antiestrogen therapy.  ALLERGIES:  is allergic to  metoprolol, statins, and demerol [meperidine hcl].  MEDICATIONS:  Current Outpatient Medications  Medication Sig Dispense Refill   tamoxifen (NOLVADEX) 10 MG tablet Take 1 tablet (10 mg total) by mouth daily. 90 tablet 3   ascorbic acid (VITAMIN C) 500 MG tablet Take 500 mg by mouth daily.     Calcium Carb-Cholecalciferol (CALCIUM+D3) 600-800 MG-UNIT TABS Take 1 tablet by mouth daily.     Cyanocobalamin (VITAMIN B-12) 5000 MCG SUBL Place 5,000 mcg under the tongue daily.     levothyroxine (SYNTHROID, LEVOTHROID) 125 MCG tablet Take 125 mcg by mouth daily before breakfast.     zinc gluconate 50 MG tablet Take 50 mg by mouth daily.     No current facility-administered medications for this visit.    PHYSICAL EXAMINATION: ECOG PERFORMANCE STATUS: 1 - Symptomatic but completely ambulatory  Vitals:   05/22/21 1151  BP: (!) 186/67  Pulse: 99  Resp: 17  Temp: 98.1 F (36.7 C)   Filed Weights   05/22/21 1151  Weight: 131 lb 12.8 oz (59.8 kg)       LABORATORY DATA:  I have reviewed the data as listed CMP Latest Ref Rng & Units 04/15/2021 09/25/2020 09/16/2020  Glucose 70 - 99 mg/dL 127(H) 134(H) -  BUN 8 - 23 mg/dL 14 13 -  Creatinine 0.44 - 1.00 mg/dL 0.73 0.47 -  Sodium 135 - 145 mmol/L 141 139 -  Potassium 3.5 - 5.1 mmol/L 3.7 3.6 -  Chloride 98 - 111 mmol/L 103 101 -  CO2 22 - 32 mmol/L 28 28 -  Calcium 8.9 - 10.3 mg/dL 9.8 9.2 -  Total Protein 6.5 -  8.1 g/dL 7.8 8.0 7.7  Total Bilirubin 0.3 - 1.2 mg/dL 0.9 0.7 0.4  Alkaline Phos 38 - 126 U/L 93 82 76  AST 15 - 41 U/L 21 17 16   ALT 0 - 44 U/L 16 13 10     Lab Results  Component Value Date   WBC 5.5 04/15/2021   HGB 12.3 04/15/2021   HCT 37.1 04/15/2021   MCV 93.2 04/15/2021   PLT 233 04/15/2021   NEUTROABS 3.6 04/15/2021    ASSESSMENT & PLAN:  Ductal carcinoma in situ (DCIS) of left breast 04/27/2021:Left lumpectomy Kathleen Flores): DCIS partially involving a papillary lesion, 1.8cm, clear margins.  Additional inferior  margin: 1.2 cm DCIS, additional left lateral margin: 1.4 cm DCIS ER 95%, PR 95%   Treatment plan: Patient does not want to radiation because of her prior bad experience for her rectal cancer.   Tamoxifen counseling:We discussed the risks and benefits of tamoxifen. These include but not limited to insomnia, hot flashes, mood changes, vaginal dryness, and weight gain. Although rare, serious side effects including endometrial cancer, risk of blood clots were also discussed. We strongly believe that the benefits far outweigh the risks. Patient understands these risks and consented to starting treatment. Planned treatment duration is 5 years.   Discussed the appropriate follow-ups with regards to tamoxifen.   She will start at 5 mg dosage for a month and if she tolerates it well then she will increase it to 10 mg daily.  Her biggest concern is risk of stroke and heart attack.  These run in her family. If she is tolerating it well then we will set up for survivorship care plan visit in 3 months. Subsequently we will see her every 6 months for 2 years then annually thereafter   No orders of the defined types were placed in this encounter.  The patient has a good understanding of the overall plan. she agrees with it. she will call with any problems that may develop before the next visit here.  Total time spent: 20 mins including face to face time and time spent for planning, charting and coordination of care  Rulon Eisenmenger, MD, MPH 05/22/2021  I, Molly Dorshimer, am acting as scribe for Dr. Nicholas Lose.  I have reviewed the above documentation for accuracy and completeness, and I agree with the above.

## 2021-05-22 ENCOUNTER — Inpatient Hospital Stay: Payer: PPO | Attending: Hematology and Oncology | Admitting: Hematology and Oncology

## 2021-05-22 ENCOUNTER — Other Ambulatory Visit: Payer: Self-pay

## 2021-05-22 DIAGNOSIS — D0512 Intraductal carcinoma in situ of left breast: Secondary | ICD-10-CM | POA: Insufficient documentation

## 2021-05-22 MED ORDER — TAMOXIFEN CITRATE 10 MG PO TABS: 10.0000 mg | ORAL_TABLET | Freq: Every day | ORAL | 3 refills | Status: DC

## 2021-05-25 DIAGNOSIS — I10 Essential (primary) hypertension: Secondary | ICD-10-CM | POA: Diagnosis not present

## 2021-05-25 DIAGNOSIS — L255 Unspecified contact dermatitis due to plants, except food: Secondary | ICD-10-CM | POA: Diagnosis not present

## 2021-06-02 DIAGNOSIS — Z433 Encounter for attention to colostomy: Secondary | ICD-10-CM | POA: Diagnosis not present

## 2021-06-18 NOTE — Progress Notes (Signed)
HEMATOLOGY-ONCOLOGY TELEPHONE VISIT PROGRESS NOTE  I connected with [name] on 06/19/2021 at 12:15 PM EDT by telephone and verified that I am speaking with the correct person using two identifiers.  I discussed the limitations, risks, security and privacy concerns of performing an evaluation and management service by telephone and the availability of in person appointments.  I also discussed with the patient that there may be a patient responsible charge related to this service. The patient expressed understanding and agreed to proceed.   History of Present Illness: Kathleen Flores is a 75 y.o. female with above-mentioned history of DCIS of the left breast who underwent a left lumpectomy, declined radiation, and is now taking tamoxifen at 5 mg daily. She reports via telephone today for follow-up.  She tolerated tamoxifen at 5 mg extremely well.  She reports to be feeling slightly tired in the middle of the day but otherwise denies any hot flashes or arthralgias or myalgias or any other issues.  Oncology History  Ductal carcinoma in situ (DCIS) of left breast  04/07/2021 Initial Diagnosis   Screening mammogram showed a left breast mass. Diagnostic mammogram and US showed a 1.2cm mass at the 3 o'clock position in the left breast and no left axillary adenopathy. Biopsy showed low to intermediate grade DCIS, ER/PR+ 95%.    04/27/2021 Surgery   Left lumpectomy [name]): DCIS partially involving a papillary lesion, 1.8cm, clear margins.  ER 95%, PR 95% Additional inferior margin: 1.2 cm DCIS,  Additional left lateral margin: 1.4 cm DCIS      Observations/Objective:  Tolerating tamoxifen well   Assessment Plan:  Ductal carcinoma in situ (DCIS) of left breast 04/27/2021:Left lumpectomy [name]): DCIS partially involving a papillary lesion, 1.8cm, clear margins.  Additional inferior margin: 1.2 cm DCIS, additional left lateral margin: 1.4 cm DCIS ER 95%, PR 95%   Treatment plan: Patient  does not want to radiation because of her prior bad experience for her rectal cancer.  Current treatment: Tamoxifen started 05/22/2021 at 5 mg daily (her biggest concern is the risk of stroke and heart attack in her family)  Tamoxifen toxicities: Mild fatigue She may try the 10 mg dose  Survivorship care plan visit in 2 months. After that I will see her every 6 months for 2 years and then annually thereafter.   I discussed the assessment and treatment plan with the patient. The patient was provided an opportunity to ask questions and all were answered. The patient agreed with the plan and demonstrated an understanding of the instructions. The patient was advised to call back or seek an in-person evaluation if the symptoms worsen or if the condition fails to improve as anticipated.   Total time spent: 11 mins including non-face to face time and time spent for planning, charting and coordination of care  Rulon Eisenmenger, MD 06/19/2021    I, Thana Ates, am acting as scribe for Nicholas Lose, MD.  I have reviewed the above documentation for accuracy and completeness, and I agree with the above.

## 2021-06-19 ENCOUNTER — Inpatient Hospital Stay: Payer: PPO | Attending: Hematology and Oncology | Admitting: Hematology and Oncology

## 2021-06-19 ENCOUNTER — Other Ambulatory Visit: Payer: Self-pay

## 2021-06-19 DIAGNOSIS — D0512 Intraductal carcinoma in situ of left breast: Secondary | ICD-10-CM

## 2021-06-19 NOTE — Assessment & Plan Note (Signed)
04/27/2021:Left lumpectomy [name]): DCIS partially involving a papillary lesion, 1.8cm, clear margins.Additional inferior margin: 1.2 cm DCIS, additional left lateral margin: 1.4 cm DCIS ER 95%, PR 95%  Treatment plan:Patient does not want to radiation because of her prior bad experience for her rectal cancer.  Current treatment: Tamoxifen started 05/22/2021 at 5 mg daily (her biggest concern is the risk of stroke and heart attack in her family)  Survivorship care plan visit in 2 months. After that I will see her every 6 months for 2 years and then annually thereafter. Tamoxifen toxicities:

## 2021-06-22 ENCOUNTER — Telehealth: Payer: Self-pay | Admitting: Hematology and Oncology

## 2021-06-22 NOTE — Telephone Encounter (Signed)
Scheduled per 7/8 los. Called and spoke with pt confirmed 9/8 appt

## 2021-07-06 DIAGNOSIS — Z433 Encounter for attention to colostomy: Secondary | ICD-10-CM | POA: Diagnosis not present

## 2021-07-21 ENCOUNTER — Other Ambulatory Visit: Payer: Self-pay | Admitting: Adult Health

## 2021-07-21 DIAGNOSIS — D0512 Intraductal carcinoma in situ of left breast: Secondary | ICD-10-CM

## 2021-07-22 ENCOUNTER — Ambulatory Visit: Payer: PPO | Admitting: Nurse Practitioner

## 2021-07-22 ENCOUNTER — Other Ambulatory Visit: Payer: Self-pay

## 2021-07-22 ENCOUNTER — Encounter: Payer: Self-pay | Admitting: Nurse Practitioner

## 2021-07-22 VITALS — BP 190/66 | HR 96 | Ht 66.0 in | Wt 133.1 lb

## 2021-07-22 DIAGNOSIS — Z85038 Personal history of other malignant neoplasm of large intestine: Secondary | ICD-10-CM | POA: Diagnosis not present

## 2021-07-22 DIAGNOSIS — R195 Other fecal abnormalities: Secondary | ICD-10-CM | POA: Diagnosis not present

## 2021-07-22 MED ORDER — DIPHENOXYLATE-ATROPINE 2.5-0.025 MG PO TABS: ORAL_TABLET | ORAL | 0 refills | Status: DC

## 2021-07-22 NOTE — Patient Instructions (Addendum)
If you are age 74 or older, your body mass index should be between 23-30. Your Body mass index is 21.49 kg/m. If this is out of the aforementioned range listed, please consider follow up with your Primary Care Provider. _________________________________________________________  The [facility] providers would like to encourage you to use [facility] to communicate with providers for non-urgent requests or questions.  Due to long hold times on the telephone, sending your provider a message by [facility] may be a faster and more efficient way to get a response.  Please allow 48 business hours for a response.  Please remember that this is for non-urgent requests.   STOP Imodium  START Lomotil 1 tablet 1-2 times daily as needed.  Thank you for entrusting me with your care and choosing [facility].  Tye Savoy NP-C

## 2021-07-22 NOTE — Progress Notes (Signed)
I agree with the above note, plan.  She had a full colonoscopy May 2021 and so should have repeat colonoscopy May 2024 per general protocol following colon cancer resection.  Thanks  Patty, Can you put her in recall for colonoscopy May 2024.  Thanks

## 2021-07-22 NOTE — Progress Notes (Signed)
Recall entered for May 2024 colon with Dr Ardis Hughs.
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
## 2021-07-23 DIAGNOSIS — K9403 Colostomy malfunction: Secondary | ICD-10-CM | POA: Diagnosis not present

## 2021-08-11 DIAGNOSIS — Z432 Encounter for attention to ileostomy: Secondary | ICD-10-CM | POA: Diagnosis not present

## 2021-08-18 ENCOUNTER — Telehealth: Payer: Self-pay | Admitting: Nurse Practitioner

## 2021-08-18 NOTE — Telephone Encounter (Signed)
DOD Patient of Dr Ardis Hughs who was last seen by Tye Savoy, NP. She has a complicated GI and colorectal surgical history. She has a "descending colostomy" and was given recommendation to take 1 to 2 Lomotil daily in addition to fiber to encourage some consistency to the bowel output. She is asking if she can take a 3rd Lomotil today. Please advise.

## 2021-08-18 NOTE — Telephone Encounter (Signed)
Pt wants to know if she can take a third dose of lomotil. She stated that her colostomy bag sometimes runs all day. She stated to have taken a third one before and it helped. Pls call her.

## 2021-08-18 NOTE — Telephone Encounter (Signed)
Spoke with the patient and shared the recommendations. She started taking 2 at the same time yesterday. It does not seem to hold her all day. She feels it does take 3 a day or she has to change her bag more often. This limits her ability to leave the house. She will keep in touch with [place] regarding her progress.

## 2021-08-18 NOTE — Telephone Encounter (Addendum)
Sure, she can take a third Lomotil  If she is not already I would try taking 2 in the morning at the same time and not 1 followed by another

## 2021-08-19 DIAGNOSIS — E039 Hypothyroidism, unspecified: Secondary | ICD-10-CM | POA: Diagnosis not present

## 2021-08-19 DIAGNOSIS — E782 Mixed hyperlipidemia: Secondary | ICD-10-CM | POA: Diagnosis not present

## 2021-08-19 DIAGNOSIS — D649 Anemia, unspecified: Secondary | ICD-10-CM | POA: Diagnosis not present

## 2021-08-19 DIAGNOSIS — I10 Essential (primary) hypertension: Secondary | ICD-10-CM | POA: Diagnosis not present

## 2021-08-20 ENCOUNTER — Other Ambulatory Visit: Payer: Self-pay

## 2021-08-20 ENCOUNTER — Inpatient Hospital Stay: Payer: PPO | Attending: Hematology and Oncology | Admitting: Adult Health

## 2021-08-20 VITALS — BP 187/71 | HR 84 | Temp 97.9°F | Resp 18 | Ht 66.0 in | Wt 134.4 lb

## 2021-08-20 DIAGNOSIS — Z79810 Long term (current) use of selective estrogen receptor modulators (SERMs): Secondary | ICD-10-CM | POA: Insufficient documentation

## 2021-08-20 DIAGNOSIS — D0512 Intraductal carcinoma in situ of left breast: Secondary | ICD-10-CM | POA: Insufficient documentation

## 2021-08-20 DIAGNOSIS — Z170 Estrogen receptor positive status [ER+]: Secondary | ICD-10-CM | POA: Diagnosis not present

## 2021-08-20 DIAGNOSIS — Z87891 Personal history of nicotine dependence: Secondary | ICD-10-CM | POA: Diagnosis not present

## 2021-08-20 DIAGNOSIS — Z801 Family history of malignant neoplasm of trachea, bronchus and lung: Secondary | ICD-10-CM | POA: Insufficient documentation

## 2021-08-20 DIAGNOSIS — Z780 Asymptomatic menopausal state: Secondary | ICD-10-CM | POA: Diagnosis not present
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
## 2021-08-21 DIAGNOSIS — C20 Malignant neoplasm of rectum: Secondary | ICD-10-CM | POA: Diagnosis not present

## 2021-08-21 DIAGNOSIS — Z9889 Other specified postprocedural states: Secondary | ICD-10-CM | POA: Diagnosis not present

## 2021-08-21 DIAGNOSIS — E039 Hypothyroidism, unspecified: Secondary | ICD-10-CM | POA: Diagnosis not present

## 2021-08-21 DIAGNOSIS — Z0000 Encounter for general adult medical examination without abnormal findings: Secondary | ICD-10-CM | POA: Diagnosis not present

## 2021-08-21 DIAGNOSIS — R030 Elevated blood-pressure reading, without diagnosis of hypertension: Secondary | ICD-10-CM | POA: Diagnosis not present

## 2021-08-21 DIAGNOSIS — D0512 Intraductal carcinoma in situ of left breast: Secondary | ICD-10-CM | POA: Diagnosis not present

## 2021-08-21 DIAGNOSIS — I10 Essential (primary) hypertension: Secondary | ICD-10-CM | POA: Diagnosis not present

## 2021-08-22 ENCOUNTER — Encounter: Payer: Self-pay | Admitting: Adult Health

## 2021-09-28 ENCOUNTER — Telehealth: Payer: Self-pay | Admitting: Nurse Practitioner

## 2021-09-28 MED ORDER — DIPHENOXYLATE-ATROPINE 2.5-0.025 MG PO TABS: ORAL_TABLET | ORAL | 1 refills | Status: DC

## 2021-09-28 NOTE — Telephone Encounter (Signed)
New script has been sent to the pharmacy

## 2021-09-28 NOTE — Telephone Encounter (Signed)
Inbound call from patient about medication problem. States because of her colostomy, she takes lomotil 3 times a day and the refill is only 30 tablets so will not have enough. Is requesting is she could get the correct dose to help.

## 2021-10-01 MED ORDER — DIPHENOXYLATE-ATROPINE 2.5-0.025 MG PO TABS: ORAL_TABLET | ORAL | 1 refills | Status: DC

## 2021-10-01 MED ORDER — DIPHENOXYLATE-ATROPINE 2.5-0.025 MG PO TABS
ORAL_TABLET | ORAL | 1 refills | Status: DC
Start: 2021-10-01 — End: 2022-03-17

## 2021-10-01 NOTE — Addendum Note (Signed)
Addended by: Aleatha Borer on: 10/01/2021 04:27 PM   Modules accepted: Orders

## 2021-10-01 NOTE — Telephone Encounter (Signed)
She also wanted to know if it is possible that colon could regenerate top start working again?

## 2021-10-01 NOTE — Telephone Encounter (Signed)
Called and spoke with patient she needed new script for her Lomotil to be for 3 times daily, as previously okay' d by Dr. Carlean Purl. She has advised that 3-4  times daily seems to be helpful; but still is about the same as taking Imodium. She did try to increase her fiber but that did not help. Please advise.

## 2021-10-01 NOTE — Telephone Encounter (Signed)
Inbound call from patient stating Lomotil quantity should be more than prescribed since she takes 3-4 tablets not 1-2 a day.  Please advise.

## 2021-10-05 NOTE — Telephone Encounter (Signed)
New script has already been sent to pharmacy.

## 2021-10-07 NOTE — Telephone Encounter (Signed)
Kathleen Flores, I did talk with the patient. Basically she is wanting to know if her bowels will ever function differently from the way it is now. She reports "high volume of liquid stool." She does not travel anymore because she is constantly having to empty the bag. She was taking 8 Imodium a day before the Lomotil. She cannot tell that one medication works any better than the other. She wants to try 4 lomotil a day to see if that would decrease the flow any. Her original question was, can the large intestine ever regenerate or recover after radiation damage. She is grasping for hope that she will not always be miserable with her issues, that it will become more manageable.

## 2021-10-12 NOTE — Telephone Encounter (Signed)
Spoke with the patient. Apologized for the delay in getting back to her because I was out of the office. She states understanding. Read the response from Dr Ardis Hughs. Appointment scheduled 12/18/21 at 9:10 am. Appointment card will be mailed per her request. Patient thanks me for my call.

## 2021-11-12 ENCOUNTER — Inpatient Hospital Stay: Payer: PPO | Attending: Hematology and Oncology | Admitting: Oncology

## 2021-11-12 ENCOUNTER — Other Ambulatory Visit: Payer: Self-pay

## 2021-11-12 ENCOUNTER — Inpatient Hospital Stay: Payer: PPO

## 2021-11-12 VITALS — BP 155/86 | HR 96 | Temp 98.1°F | Resp 20 | Ht 66.0 in | Wt 137.6 lb

## 2021-11-12 DIAGNOSIS — K289 Gastrojejunal ulcer, unspecified as acute or chronic, without hemorrhage or perforation: Secondary | ICD-10-CM | POA: Insufficient documentation

## 2021-11-12 DIAGNOSIS — M4628 Osteomyelitis of vertebra, sacral and sacrococcygeal region: Secondary | ICD-10-CM | POA: Diagnosis not present

## 2021-11-12 DIAGNOSIS — D0512 Intraductal carcinoma in situ of left breast: Secondary | ICD-10-CM | POA: Diagnosis not present

## 2021-11-12 DIAGNOSIS — C20 Malignant neoplasm of rectum: Secondary | ICD-10-CM | POA: Diagnosis not present

## 2021-11-12 DIAGNOSIS — I10 Essential (primary) hypertension: Secondary | ICD-10-CM | POA: Diagnosis not present

## 2021-11-12 DIAGNOSIS — Z170 Estrogen receptor positive status [ER+]: Secondary | ICD-10-CM | POA: Diagnosis not present

## 2021-11-12 DIAGNOSIS — F419 Anxiety disorder, unspecified: Secondary | ICD-10-CM | POA: Diagnosis not present

## 2021-11-12 DIAGNOSIS — Z79810 Long term (current) use of selective estrogen receptor modulators (SERMs): Secondary | ICD-10-CM | POA: Insufficient documentation

## 2021-11-12 DIAGNOSIS — Z933 Colostomy status: Secondary | ICD-10-CM | POA: Diagnosis not present

## 2021-11-12 LAB — CEA (ACCESS): CEA (CHCC): 1.45 ng/mL (ref 0.00–5.00)
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
## 2021-11-13 ENCOUNTER — Telehealth: Payer: Self-pay

## 2021-11-13 NOTE — Telephone Encounter (Signed)
Ladell Pier, MD  P [facility] Clinical Please call patient, the CEA is normal, follow-up as scheduled Called Pt she received the message and she will follow-up as schedule

## 2021-11-14 ENCOUNTER — Other Ambulatory Visit: Payer: Self-pay | Admitting: Gastroenterology

## 2021-12-18 ENCOUNTER — Encounter: Payer: Self-pay | Admitting: Gastroenterology

## 2021-12-18 ENCOUNTER — Ambulatory Visit: Payer: PPO | Admitting: Gastroenterology

## 2021-12-18 VITALS — BP 150/70 | HR 60 | Ht 67.0 in | Wt 136.0 lb

## 2021-12-18 DIAGNOSIS — Z85038 Personal history of other malignant neoplasm of large intestine: Secondary | ICD-10-CM

## 2021-12-18 NOTE — Patient Instructions (Signed)
If you are age 76 or older, your body mass index should be between 23-30. Your Body mass index is 21.3 kg/m. If this is out of the aforementioned range listed, please consider follow up with your Primary Care Provider. ________________________________________________________  The [place] [facility] providers would like to encourage you to use [facility] to communicate with providers for non-urgent requests or questions.  Due to long hold times on the telephone, sending your provider a message by [facility] may be a faster and more efficient way to get a response.  Please allow 48 business hours for a response.  Please remember that this is for non-urgent requests.  _______________________________________________________  Dennis Bast will be due for a colonoscopy in May 2024.  We will contact you to get this scheduled.  Please follow up with our office on an as needed basis.   Thank you for entrusting me with your care and choosing [facility].  Dr Ardis Hughs
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
## 2022-01-16 ENCOUNTER — Other Ambulatory Visit: Payer: Self-pay | Admitting: Nurse Practitioner

## 2022-02-16 NOTE — Progress Notes (Signed)
? ?Patient Care Team: ?[name], Rea as PCP - General (Nurse Practitioner) ?Michael Boston, MD as Consulting Physician (General Surgery) ?Milus Banister, MD as Consulting Physician (Gastroenterology) ?Ladell Pier, MD as Consulting Physician (Oncology) ?Rolm Bookbinder, MD as Consulting Physician (General Surgery) ?Nicholas Lose, MD as Consulting Physician (Hematology and Oncology) ?Kyung Rudd, MD as Consulting Physician (Radiation Oncology) ? ?DIAGNOSIS:  ?  ICD-10-CM   ?1. Ductal carcinoma in situ (DCIS) of left breast  D05.12   ?  ? ? ?SUMMARY OF ONCOLOGIC HISTORY: ?Oncology History  ?Ductal carcinoma in situ (DCIS) of left breast  ?04/07/2021 Initial Diagnosis  ? Screening mammogram showed a left breast mass. Diagnostic mammogram and US showed a 1.2cm mass at the 3 o'clock position in the left breast and no left axillary adenopathy. Biopsy showed low to intermediate grade DCIS, ER/PR+ 95%.  ?  ?04/27/2021 Surgery  ? Left lumpectomy Kathleen Flores): DCIS partially involving a papillary lesion, 1.8cm, clear margins.  ER 95%, PR 95% ?Additional inferior margin: 1.2 cm DCIS,  ?Additional left lateral margin: 1.4 cm DCIS  ?  ?04/27/2021 Cancer Staging  ? Staging form: Breast, AJCC 8th Edition ?- Pathologic stage from 04/27/2021: Stage 0 (pTis (DCIS), pN0, cM0) - Signed by Gardenia Phlegm, NP on 08/20/2021 ?Stage prefix: Initial diagnosis ? ?  ? ? ?CHIEF COMPLIANT: Follow-up of left breast DCIS ? ?INTERVAL HISTORY: Kathleen Flores is a 76 y.o. with above-mentioned history of DCIS of the left breast who underwent a left lumpectomy, declined radiation, and is now taking tamoxifen at 5 mg daily. She presents to the clinic today for follow-up.  She is very anxious today because she had caffeine and her blood pressure is through the roof.  She has not had any major troubles taking tamoxifen.  Denies any hot flashes or arthralgias or myalgias or mood swings. ? ?ALLERGIES:  is allergic to metoprolol,  statins, and demerol [meperidine hcl]. ? ?MEDICATIONS:  ?Current Outpatient Medications  ?Medication Sig Dispense Refill  ? ascorbic acid (VITAMIN C) 500 MG tablet Take 500 mg by mouth daily.    ? Calcium Carb-Cholecalciferol (CALCIUM+D3) 600-800 MG-UNIT TABS Take 1 tablet by mouth daily.    ? Cyanocobalamin (VITAMIN B-12) 5000 MCG SUBL Place 5,000 mcg under the tongue daily.    ? dicyclomine (BENTYL) 10 MG capsule Take 10 mg by mouth as needed.    ? diphenoxylate-atropine (LOMOTIL) 2.5-0.025 MG tablet Take 1 tablet 3 times daily as needed 90 tablet 1  ? levothyroxine (SYNTHROID, LEVOTHROID) 125 MCG tablet Take 125 mcg by mouth daily before breakfast.    ? loperamide (IMODIUM) 2 MG capsule TAKE 1 CAPSULE BY MOUTH EVERY MORNING, 2 CAPSULES AT LUNCH, AND 1 AT NIGHT 120 capsule 5  ? Ostomy Supplies (SENSURA DRAINABLE) Pouch MISC Use when changing pouching system    ? tamoxifen (NOLVADEX) 10 MG tablet Take 1 tablet (10 mg total) by mouth daily. 90 tablet 3  ? zinc gluconate 50 MG tablet Take 50 mg by mouth daily.    ? ?No current facility-administered medications for this visit.  ? ? ?PHYSICAL EXAMINATION: ?ECOG PERFORMANCE STATUS: 1 - Symptomatic but completely ambulatory ? ?Vitals:  ? 02/17/22 9798  ?BP: (!) 209/87  ?Pulse: (!) 102  ?Resp: 18  ?Temp: 97.7 ?F (36.5 ?C)  ?SpO2: 100%  ? ?Filed Weights  ? 02/17/22 9211  ?Weight: 136 lb 3.2 oz (61.8 kg)  ? ? ?BREAST: No palpable masses or nodules in either right or left breasts. No palpable axillary  supraclavicular or infraclavicular adenopathy no breast tenderness or nipple discharge. (exam performed in the presence of a chaperone) ? ?LABORATORY DATA:  ?I have reviewed the data as listed ?CMP Latest Ref Rng & Units 04/15/2021 09/25/2020 09/16/2020  ?Glucose 70 - 99 mg/dL 127(H) 134(H) -  ?BUN 8 - 23 mg/dL 14 13 -  ?Creatinine 0.44 - 1.00 mg/dL 0.73 0.47 -  ?Sodium 135 - 145 mmol/L 141 139 -  ?Potassium 3.5 - 5.1 mmol/L 3.7 3.6 -  ?Chloride 98 - 111 mmol/L 103 101 -  ?CO2 22  - 32 mmol/L 28 28 -  ?Calcium 8.9 - 10.3 mg/dL 9.8 9.2 -  ?Total Protein 6.5 - 8.1 g/dL 7.8 8.0 7.7  ?Total Bilirubin 0.3 - 1.2 mg/dL 0.9 0.7 0.4  ?Alkaline Phos 38 - 126 U/L 93 82 76  ?AST 15 - 41 U/L '21 17 16  '$ ?ALT 0 - 44 U/L '16 13 10  '$ ? ? ?Lab Results  ?Component Value Date  ? WBC 5.5 04/15/2021  ? HGB 12.3 04/15/2021  ? HCT 37.1 04/15/2021  ? MCV 93.2 04/15/2021  ? PLT 233 04/15/2021  ? NEUTROABS 3.6 04/15/2021  ? ? ?ASSESSMENT & PLAN:  ?Ductal carcinoma in situ (DCIS) of left breast ?04/27/2021:Left lumpectomy Kathleen Flores): DCIS partially involving a papillary lesion, 1.8cm, clear margins.  Additional inferior margin: 1.2 cm DCIS, additional left lateral margin: 1.4 cm DCIS ER 95%, PR 95% ?  ?Treatment plan: Patient does not want to radiation because of her prior bad experience for her rectal cancer. ?  ?Current treatment: Tamoxifen started 05/22/2021 at 5 mg daily (her biggest concern is the risk of stroke and heart attack in her family) ?  ?Tamoxifen toxicities: ? Currently on 10 mg dosage and tolerating it well  ? ?Breast cancer surveillance: ?Breast exam 02/17/2022: Benign ?Mammogram scheduled for 03/12/2022 ? ?Severe hypertension: Due to drinking coffee earlier.  I instructed her not to drink any further caffeinated beverages. ?She will recheck her blood pressure on the way out and also at home. ? ?Return to clinic in 6 months for follow-up and after that we can see her once a year ? ?No orders of the defined types were placed in this encounter. ? ?The patient has a good understanding of the overall plan. she agrees with it. she will call with any problems that may develop before the next visit here. ? ?Total time spent: 20 mins including face to face time and time spent for planning, charting and coordination of care ? ?Rulon Eisenmenger, MD, MPH ?02/17/2022 ? ?I, Thana Ates, am acting as scribe for Dr. Nicholas Lose. ? ?I have reviewed the above documentation for accuracy and completeness, and I agree with the  above. ? ? ? ? ? ? ?

## 2022-02-16 NOTE — Assessment & Plan Note (Signed)
04/27/2021:Left lumpectomy Kathleen Flores): DCIS partially involving a papillary lesion, 1.8cm, clear margins.??Additional inferior margin: 1.2 cm DCIS, additional left lateral margin: 1.4 cm DCIS ER 95%, PR 95% ?? ?Treatment plan:?Patient does not want to radiation because of her prior bad experience for her rectal cancer. ?? ?Current treatment: Tamoxifen started 05/22/2021 at 5 mg daily (her biggest concern is the risk of stroke and heart attack in her family) ?? ?Tamoxifen toxicities: ?Mild fatigue ?She may try the 10 mg dose ?

## 2022-02-17 ENCOUNTER — Inpatient Hospital Stay: Payer: PPO | Attending: Hematology and Oncology | Admitting: Hematology and Oncology

## 2022-02-17 ENCOUNTER — Other Ambulatory Visit: Payer: Self-pay

## 2022-02-17 DIAGNOSIS — Z85048 Personal history of other malignant neoplasm of rectum, rectosigmoid junction, and anus: Secondary | ICD-10-CM | POA: Insufficient documentation

## 2022-02-17 DIAGNOSIS — I10 Essential (primary) hypertension: Secondary | ICD-10-CM | POA: Diagnosis not present

## 2022-02-17 DIAGNOSIS — Z79810 Long term (current) use of selective estrogen receptor modulators (SERMs): Secondary | ICD-10-CM | POA: Diagnosis not present

## 2022-02-17 DIAGNOSIS — D0512 Intraductal carcinoma in situ of left breast: Secondary | ICD-10-CM

## 2022-03-12 ENCOUNTER — Ambulatory Visit
Admission: RE | Admit: 2022-03-12 | Discharge: 2022-03-12 | Disposition: A | Discharge status: Home / Self Care | Payer: PPO | Source: Ambulatory Visit | Attending: Adult Health | Admitting: Adult Health

## 2022-03-12 DIAGNOSIS — D0512 Intraductal carcinoma in situ of left breast: Secondary | ICD-10-CM

## 2022-03-16 ENCOUNTER — Other Ambulatory Visit: Payer: Self-pay | Admitting: Nurse Practitioner

## 2022-03-17 ENCOUNTER — Encounter (HOSPITAL_COMMUNITY): Payer: Self-pay

## 2022-03-17 ENCOUNTER — Other Ambulatory Visit: Payer: Self-pay

## 2022-03-17 MED ORDER — DIPHENOXYLATE-ATROPINE 2.5-0.025 MG PO TABS: ORAL_TABLET | ORAL | 1 refills | Status: DC

## 2022-04-09 ENCOUNTER — Telehealth: Payer: Self-pay | Admitting: Gastroenterology

## 2022-04-09 ENCOUNTER — Other Ambulatory Visit: Payer: Self-pay | Admitting: Nurse Practitioner

## 2022-04-09 NOTE — Telephone Encounter (Signed)
Spoke with patient and explained to her that an Rx for lomotil was sent to the pharmacy on 03-17-22 and this could be the reason that she could not get another refill as it is too soon.  I told her we would send another refill after 04-16-22.  Patient verbalized understanding.  ?

## 2022-04-09 NOTE — Telephone Encounter (Signed)
Patient called states the pharmacy notified  her that she is not able to refill her Lomotil and she is not sure why please advise. ?

## 2022-04-14 ENCOUNTER — Other Ambulatory Visit: Payer: Self-pay | Admitting: Nurse Practitioner

## 2022-04-15 ENCOUNTER — Other Ambulatory Visit: Payer: Self-pay

## 2022-04-15 MED ORDER — DIPHENOXYLATE-ATROPINE 2.5-0.025 MG PO TABS: ORAL_TABLET | ORAL | 1 refills | Status: DC

## 2022-04-16 NOTE — Telephone Encounter (Signed)
Medication was sent to pharmacy by Cherylann Parr, CMA by Stoney Bang on 04-15-22. ?

## 2022-05-13 ENCOUNTER — Inpatient Hospital Stay: Payer: PPO

## 2022-05-13 ENCOUNTER — Inpatient Hospital Stay: Payer: PPO | Attending: Hematology and Oncology | Admitting: Oncology

## 2022-05-13 VITALS — BP 160/80 | HR 94 | Temp 98.2°F | Resp 18 | Ht 67.0 in | Wt 137.8 lb

## 2022-05-13 DIAGNOSIS — D0512 Intraductal carcinoma in situ of left breast: Secondary | ICD-10-CM | POA: Diagnosis present

## 2022-05-13 DIAGNOSIS — C20 Malignant neoplasm of rectum: Secondary | ICD-10-CM

## 2022-05-13 DIAGNOSIS — F419 Anxiety disorder, unspecified: Secondary | ICD-10-CM | POA: Diagnosis not present

## 2022-05-13 DIAGNOSIS — Z933 Colostomy status: Secondary | ICD-10-CM | POA: Insufficient documentation

## 2022-05-13 DIAGNOSIS — Z79810 Long term (current) use of selective estrogen receptor modulators (SERMs): Secondary | ICD-10-CM | POA: Diagnosis not present

## 2022-05-13 DIAGNOSIS — Z923 Personal history of irradiation: Secondary | ICD-10-CM | POA: Diagnosis not present

## 2022-05-13 DIAGNOSIS — I10 Essential (primary) hypertension: Secondary | ICD-10-CM | POA: Diagnosis not present

## 2022-05-13 DIAGNOSIS — Z85048 Personal history of other malignant neoplasm of rectum, rectosigmoid junction, and anus: Secondary | ICD-10-CM | POA: Insufficient documentation

## 2022-05-13 LAB — CEA (ACCESS): CEA (CHCC): <1 ng/mL (ref 0.00–5.00)
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
## 2022-05-17 ENCOUNTER — Other Ambulatory Visit: Payer: Self-pay | Admitting: Hematology and Oncology

## 2022-06-14 ENCOUNTER — Other Ambulatory Visit: Payer: Self-pay | Admitting: Gastroenterology

## 2022-08-13 ENCOUNTER — Other Ambulatory Visit: Payer: Self-pay | Admitting: Gastroenterology

## 2022-08-20 ENCOUNTER — Inpatient Hospital Stay: Payer: PPO | Attending: Hematology and Oncology | Admitting: Adult Health

## 2022-08-20 ENCOUNTER — Other Ambulatory Visit: Payer: Self-pay

## 2022-08-20 VITALS — BP 209/79 | HR 107 | Temp 98.6°F | Resp 18 | Ht 67.0 in | Wt 139.5 lb

## 2022-08-20 DIAGNOSIS — Z85048 Personal history of other malignant neoplasm of rectum, rectosigmoid junction, and anus: Secondary | ICD-10-CM | POA: Insufficient documentation

## 2022-08-20 DIAGNOSIS — Z87891 Personal history of nicotine dependence: Secondary | ICD-10-CM | POA: Insufficient documentation

## 2022-08-20 DIAGNOSIS — D0512 Intraductal carcinoma in situ of left breast: Secondary | ICD-10-CM | POA: Insufficient documentation

## 2022-08-20 DIAGNOSIS — Z801 Family history of malignant neoplasm of trachea, bronchus and lung: Secondary | ICD-10-CM | POA: Insufficient documentation

## 2022-08-20 DIAGNOSIS — F411 Generalized anxiety disorder: Secondary | ICD-10-CM | POA: Diagnosis not present

## 2022-08-20 DIAGNOSIS — Z79810 Long term (current) use of selective estrogen receptor modulators (SERMs): Secondary | ICD-10-CM | POA: Insufficient documentation

## 2022-08-20 DIAGNOSIS — R030 Elevated blood-pressure reading, without diagnosis of hypertension: Secondary | ICD-10-CM | POA: Insufficient documentation

## 2022-08-20 NOTE — Assessment & Plan Note (Addendum)
Kathleen Flores is a 76 year old woman with history of noninvasive breast cancer stage 0 status postlumpectomy and currently taking tamoxifen that she began in June 2022.  She is on 10 mg daily and tolerating it well.  She will continue this.  Her most recent mammogram was completed in March 2023.  She is due for repeat in March 2024.  Our recommendation is for her to continue tamoxifen through June 2027.  We discussed her elevated blood pressure and that it is anxiety related she has no symptoms related to this.  She does continue to monitor this at home.  She has no clinical or radiographic signs of breast cancer recurrence.  She will continue to follow-up with Dr. [name] her surgeon, Dr. Malachy Mood for her rectal cancer, and she will return to see [place] in 6 months for follow-up.
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
## 2022-09-14 ENCOUNTER — Emergency Department (HOSPITAL_COMMUNITY): Payer: PPO

## 2022-09-14 ENCOUNTER — Other Ambulatory Visit: Payer: Self-pay

## 2022-09-14 ENCOUNTER — Encounter (HOSPITAL_COMMUNITY): Payer: Self-pay

## 2022-09-14 ENCOUNTER — Observation Stay (HOSPITAL_COMMUNITY)
Admission: EM | Admit: 2022-09-14 | Discharge: 2022-09-15 | Disposition: A | Discharge status: Home / Self Care | Payer: PPO | Attending: Internal Medicine | Admitting: Internal Medicine

## 2022-09-14 DIAGNOSIS — S22060A Wedge compression fracture of T7-T8 vertebra, initial encounter for closed fracture: Secondary | ICD-10-CM | POA: Insufficient documentation

## 2022-09-14 DIAGNOSIS — I10 Essential (primary) hypertension: Secondary | ICD-10-CM | POA: Insufficient documentation

## 2022-09-14 DIAGNOSIS — Z853 Personal history of malignant neoplasm of breast: Secondary | ICD-10-CM | POA: Insufficient documentation

## 2022-09-14 DIAGNOSIS — Z933 Colostomy status: Secondary | ICD-10-CM | POA: Diagnosis not present

## 2022-09-14 DIAGNOSIS — D0512 Intraductal carcinoma in situ of left breast: Secondary | ICD-10-CM | POA: Diagnosis not present

## 2022-09-14 DIAGNOSIS — Z87891 Personal history of nicotine dependence: Secondary | ICD-10-CM | POA: Diagnosis not present

## 2022-09-14 DIAGNOSIS — E039 Hypothyroidism, unspecified: Secondary | ICD-10-CM | POA: Diagnosis not present

## 2022-09-14 DIAGNOSIS — W19XXXA Unspecified fall, initial encounter: Secondary | ICD-10-CM | POA: Insufficient documentation

## 2022-09-14 DIAGNOSIS — Z85040 Personal history of malignant carcinoid tumor of rectum: Secondary | ICD-10-CM | POA: Insufficient documentation

## 2022-09-14 DIAGNOSIS — R55 Syncope and collapse: Principal | ICD-10-CM | POA: Diagnosis present

## 2022-09-14 DIAGNOSIS — Z79899 Other long term (current) drug therapy: Secondary | ICD-10-CM | POA: Insufficient documentation

## 2022-09-14 DIAGNOSIS — S0083XA Contusion of other part of head, initial encounter: Secondary | ICD-10-CM | POA: Insufficient documentation

## 2022-09-14 LAB — URINALYSIS, ROUTINE W REFLEX MICROSCOPIC
Bilirubin Urine: NEGATIVE
Glucose, UA: NEGATIVE mg/dL
Hgb urine dipstick: NEGATIVE
Ketones, ur: 20 mg/dL — AB
Leukocytes,Ua: NEGATIVE
Nitrite: NEGATIVE
Protein, ur: NEGATIVE mg/dL
Specific Gravity, Urine: 1.045 — ABNORMAL HIGH (ref 1.005–1.030)
pH: 5 (ref 5.0–8.0)

## 2022-09-14 LAB — BASIC METABOLIC PANEL
Anion gap: 9 (ref 5–15)
BUN: 12 mg/dL (ref 8–23)
CO2: 24 mmol/L (ref 22–32)
Calcium: 8.4 mg/dL — ABNORMAL LOW (ref 8.9–10.3)
Chloride: 102 mmol/L (ref 98–111)
Creatinine, Ser: 0.55 mg/dL (ref 0.44–1.00)
GFR, Estimated: >60 mL/min (ref >60)
Glucose, Bld: 142 mg/dL — ABNORMAL HIGH (ref 70–99)
Potassium: 3.6 mmol/L (ref 3.5–5.1)
Sodium: 135 mmol/L (ref 135–145)

## 2022-09-14 LAB — CBC
HCT: 35.9 % — ABNORMAL LOW (ref 36.0–46.0)
Hemoglobin: 11.8 g/dL — ABNORMAL LOW (ref 12.0–15.0)
MCH: 30.3 pg (ref 26.0–34.0)
MCHC: 32.9 g/dL (ref 30.0–36.0)
MCV: 92.3 fL (ref 80.0–100.0)
Platelets: 176 10*3/uL (ref 150–400)
RBC: 3.89 MIL/uL (ref 3.87–5.11)
RDW: 12.4 % (ref 11.5–15.5)
WBC: 9.3 10*3/uL (ref 4.0–10.5)
nRBC: 0 % (ref 0.0–0.2)

## 2022-09-14 LAB — LACTIC ACID, PLASMA
Lactic Acid, Venous: 2.6 mmol/L (ref 0.5–1.9)
Lactic Acid, Venous: 1.1 mmol/L (ref 0.5–1.9)

## 2022-09-14 LAB — TROPONIN I (HIGH SENSITIVITY): Troponin I (High Sensitivity): 12 ng/L (ref <18)

## 2022-09-14 MED ORDER — ACETAMINOPHEN 650 MG RE SUPP: 650.0000 mg | Freq: Four times a day (QID) | RECTAL | Status: DC | PRN

## 2022-09-14 MED ORDER — PROCHLORPERAZINE EDISYLATE 10 MG/2ML IJ SOLN
10.0000 mg | Freq: Once | INTRAMUSCULAR | Status: AC
  Administered 2022-09-14: 10 mg via INTRAVENOUS
  Filled 2022-09-14: qty 2

## 2022-09-14 MED ORDER — LEVOTHYROXINE SODIUM 25 MCG PO TABS: 125.0000 ug | ORAL_TABLET | Freq: Every day | ORAL | Status: DC

## 2022-09-14 MED ORDER — LEVOTHYROXINE SODIUM 112 MCG PO TABS
112.0000 ug | ORAL_TABLET | Freq: Every day | ORAL | Status: DC
  Administered 2022-09-15: 112 ug via ORAL
  Filled 2022-09-14: qty 1

## 2022-09-14 MED ORDER — ONDANSETRON HCL 4 MG PO TABS: 4.0000 mg | ORAL_TABLET | Freq: Four times a day (QID) | ORAL | Status: DC | PRN

## 2022-09-14 MED ORDER — ACETAMINOPHEN 325 MG PO TABS: 650.0000 mg | ORAL_TABLET | Freq: Four times a day (QID) | ORAL | Status: DC | PRN

## 2022-09-14 MED ORDER — ONDANSETRON HCL 4 MG/2ML IJ SOLN: 4.0000 mg | Freq: Four times a day (QID) | INTRAMUSCULAR | Status: DC | PRN

## 2022-09-14 MED ORDER — LACTATED RINGERS IV BOLUS
1000.0000 mL | Freq: Once | INTRAVENOUS | Status: AC
  Administered 2022-09-14: 1000 mL via INTRAVENOUS

## 2022-09-14 MED ORDER — HEPARIN SODIUM (PORCINE) 5000 UNIT/ML IJ SOLN
5000.0000 [IU] | Freq: Three times a day (TID) | INTRAMUSCULAR | Status: DC
  Administered 2022-09-14 – 2022-09-15 (×3): 5000 [IU] via SUBCUTANEOUS
  Filled 2022-09-14 (×3): qty 1

## 2022-09-14 MED ORDER — HYDROMORPHONE HCL 1 MG/ML IJ SOLN
1.0000 mg | Freq: Once | INTRAMUSCULAR | Status: AC
  Administered 2022-09-14: 1 mg via INTRAVENOUS
  Filled 2022-09-14: qty 1

## 2022-09-14 MED ORDER — TAMOXIFEN CITRATE 10 MG PO TABS
10.0000 mg | ORAL_TABLET | Freq: Every day | ORAL | Status: DC
  Administered 2022-09-15: 10 mg via ORAL
  Filled 2022-09-14: qty 1

## 2022-09-14 MED ORDER — IOHEXOL 300 MG/ML  SOLN
100.0000 mL | Freq: Once | INTRAMUSCULAR | Status: AC | PRN
  Administered 2022-09-14: 100 mL via INTRAVENOUS

## 2022-09-14 NOTE — Assessment & Plan Note (Addendum)
Observation med/tele bed. Neurology consulted. Echo, MRI brain and EEG. Will ask cards to get a ziopatch for her at discharge.

## 2022-09-14 NOTE — Assessment & Plan Note (Addendum)
Pt states she normally has high output colostomy drainage. Takes immodium multiple times a day. Has noticed her colostomy stoma protruding into her colostomy bag which is abnormal for her.  CT abd negative. Pt was getting ready to going to go see her surgeon at [facility] when she had syncopal episode today. EDP apparently talked with pt's surgeon at [facility]. They declined transfer. Pt to followup with her [facility] surgeon when she is discharged. For now, hold immodium as pt c/o she has not noticed nearly any colostomy output today.

## 2022-09-14 NOTE — Assessment & Plan Note (Signed)
Continue synthroid 125 mcg daily.
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
## 2022-09-14 NOTE — ED Provider Notes (Signed)
Care of the patient assumed at signout.  After signout I discussed the patient's case with her surgeon at [facility], no indication for transfer.  This conversation was active at bedside, family was participating.  Patient's abdominal exam evaluation is generally reassuring, no evidence for obstruction or infection.  However, the patient had presented to this facility due to an episode of syncope versus seizure.  I discussed this with our neurology team and they will follow as a consulting service, with plan EEG tomorrow.  With unclear circumstances, patient will be admitted for monitoring, management.   Carmin Muskrat, MD 09/14/22 2015

## 2022-09-14 NOTE — ED Notes (Signed)
Patient transported to CT 

## 2022-09-14 NOTE — ED Triage Notes (Signed)
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 The last couple of days it has not been draining properly. We made an appointment to see her surgeon at [facility] today for that issue. She went upstairs to get ready and then I found her laying in the floor face down and she was breathing heavy and foam around her mouth and she wouldn't respond" per spouse "Patient appeared to be in a post ictal state upon our arrival. Began coming around in route, but does not remember being in the bathroom or what lead to the event" per EMS  "I just want you to get her stabilized so I can take her on to [facility] to get this colostomy straight" per spouse.   Patient A&0 x 2 upon arrival. Complains of back pain.

## 2022-09-14 NOTE — Subjective & Objective (Signed)
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  Patient states that she has noticed decreased colostomy output over the last couple days.  She is also noticed more herniation of her colostomy stoma.  This morning, patient states that she was sitting in her chair/ottoman.  There is early morning.  She states the next thing she knew she woke up with her glasses falling off her face.  She does not know how this happened.  She is sure that she did not fall asleep.  As she was getting ready to go see her surgeon about her colostomy, she was found on the living room floor by her husband.  She states she does not know how she got on the floor.  She denies any prodromal symptoms.  She has no prior history of having any cardiac problems.  She denies any history of true syncope.  She has had bouts of dizziness when she has changed her colostomy bag in the past but is never truly had a loss of consciousness.  Patient brought to the ER for evaluation.  Work-up negative.  Head CT and cervical spine x-rays were negative.  White count 9.3, hemoglobin 11.8, platelets of 176  Initial lactic acid of 2.6 decreased to 1.1 after IV fluids. Chest x-ray is negative.  CT abdomen showed partial colectomy with end colostomy without evidence of bowel obstruction.  Neurology has been consulted.  Apparently Dr. Leonel Ramsay went to go see the patient but had a emergent patient situation had to leave.  [facility] hospitalist contacted for admission.

## 2022-09-14 NOTE — ED Provider Notes (Addendum)
[facility] DEPT Provider Note   CSN: [id number] Arrival date & time: 09/14/22  1111     History  Chief Complaint  Patient presents with   Loss of Consciousness    RALYN STLAURENT is a 76 y.o. female.  HPI  76 year old female with medical history significant for HLD, anxiety, rectal cancer status post partial colectomy and colostomy with Dr. Morton Stall of [facility], UTI who presents to the emergency department with concern for syncope.  The history is provided with the patient and the patient's husband.  They have noted decreased output from the patient's ostomy over the past few days.  She denies any abdominal pain, nausea or vomiting.  Due to this concern, her surgeon office had recommended that she present to the emergency department for further evaluation at [facility].  While getting ready to head to the ER by POV, the patient had an unwitnessed syncopal episode.  She was found lying on the ground with generalized shaking and some "foaming at the mouth."  She subsequently had a period of confusion described by EMS as Pote ictal.  She denies any tongue biting or urinary incontinence.  She is completely amnestic to the event.  She did experience head trauma and has a hematoma to her forehead.  She denies any neck pain, endorses a mild headache.  She is not on anticoagulation.  Since the fall, she is complained of midthoracic back pain.  She continues to endorse concern regarding decreased output from her ostomy.  She denies any other injuries or complaints.  She denies any neurologic deficits, no numbness, facial droop, weakness.  She arrived to the emergency department GCS 15, ABC intact.  Home Medications Prior to Admission medications   Medication Sig Start Date End Date Taking? Authorizing Provider  ascorbic acid (VITAMIN C) 500 MG tablet Take 500 mg by mouth daily.    [provider]  Calcium Carb-Cholecalciferol (CALCIUM+D3) 600-800  MG-UNIT TABS Take 1 tablet by mouth daily.    [provider]  Cyanocobalamin (VITAMIN B-12) 5000 MCG SUBL Place 5,000 mcg under the tongue daily.    [provider]  dicyclomine (BENTYL) 10 MG capsule Take 10 mg by mouth as needed. 07/13/21   [provider]  diphenoxylate-atropine (LOMOTIL) 2.5-0.025 MG tablet Take 1 tablet 3 times daily as needed 04/15/22   Willia Craze, NP  L-glutamine (ENDARI) 5 g PACK Powder Packet Take 5 g by mouth 2 (two) times daily.    [provider]  levothyroxine (SYNTHROID, LEVOTHROID) 125 MCG tablet Take 125 mcg by mouth daily before breakfast.    [provider]  loperamide (IMODIUM) 2 MG capsule TAKE 1 CAPSULE BY MOUTH EVERY MORNING, 2 CAPSULES AT LUNCH, AND 1 CAPSULE AT NIGHT 08/13/22   Milus Banister, MD  Ostomy Supplies [facility] DRAINABLE) Pouch MISC Use when changing pouching system 07/27/21   [provider]  tamoxifen (NOLVADEX) 10 MG tablet TAKE ONE TABLET BY MOUTH DAILY 05/17/22   Nicholas Lose, MD  zinc gluconate 50 MG tablet Take 50 mg by mouth daily.    [provider]      Allergies    Metoprolol, Statins, and Demerol [meperidine hcl]    Review of Systems   Review of Systems  Musculoskeletal:  Positive for back pain.  Neurological:  Positive for syncope.  All other systems reviewed and are negative.   Physical Exam Updated Vital Signs BP (!) 117/50   Pulse 80   Temp  98.9 F (37.2 C) (Oral)   Resp 14   Ht '5\' 7"'$  (1.702 m)   Wt 63.5 kg   SpO2 94%   BMI 21.93 kg/m  Physical Exam Vitals and nursing note reviewed.  Constitutional:      General: She is not in acute distress.    Appearance: She is well-developed.     Comments: GCS 14, ABC intact  HENT:     Head: Normocephalic.     Comments: Hematoma to the left forehead noted    Mouth/Throat:     Mouth: Mucous membranes are dry.  Eyes:     Extraocular Movements: Extraocular movements intact.     Conjunctiva/sclera:  Conjunctivae normal.     Pupils: Pupils are equal, round, and reactive to light.  Neck:     Comments: No midline tenderness to palpation of the cervical spine.  Range of motion intact Cardiovascular:     Rate and Rhythm: Normal rate and regular rhythm.     Heart sounds: No murmur heard. Pulmonary:     Effort: Pulmonary effort is normal. No respiratory distress.     Breath sounds: Normal breath sounds.  Chest:     Comments: Clavicles stable nontender to AP compression.  Chest wall stable and nontender to AP and lateral compression. Abdominal:     Palpations: Abdomen is soft.     Tenderness: There is no abdominal tenderness.     Comments: Pelvis stable to lateral compression.  Colostomy in place with no output noted in the bag, no surrounding tenderness to palpation  Musculoskeletal:     Cervical back: Neck supple.     Comments: Midline tenderness to palpation of the thoracic spine noted.  Extremities atraumatic with intact range of motion  Skin:    General: Skin is warm and dry.  Neurological:     Mental Status: She is alert.     Comments: Cranial nerves II through XII grossly intact.  Moving all 4 extremities spontaneously.  Sensation grossly intact all 4 extremities     ED Results / Procedures / Treatments   Labs (all labs ordered are listed, but only abnormal results are displayed) Labs Reviewed  BASIC METABOLIC PANEL - Abnormal; Notable for the following components:      Result Value   Glucose, Bld 142 (*)    Calcium 8.4 (*)    All other components within normal limits  CBC - Abnormal; Notable for the following components:   Hemoglobin 11.8 (*)    HCT 35.9 (*)    All other components within normal limits  LACTIC ACID, PLASMA - Abnormal; Notable for the following components:   Lactic Acid, Venous 2.6 (*)    All other components within normal limits  LACTIC ACID, PLASMA  URINALYSIS, ROUTINE W REFLEX MICROSCOPIC  CBG MONITORING, ED  TROPONIN I (HIGH SENSITIVITY)     EKG EKG Interpretation  Date/Time:  Tuesday September 14 2022 12:14:01 EDT Ventricular Rate:  94 PR Interval:  198 QRS Duration: 92 QT Interval:  380 QTC Calculation: 476 R Axis:   75 Text Interpretation: Sinus rhythm RSR' in V1 or V2, probably normal variant Confirmed by Regan Lemming ([id number]) on 09/14/2022 1:56:56 PM  Radiology CT T-SPINE NO CHARGE  Result Date: 09/14/2022 CLINICAL DATA:  Found down EXAM: CT Thoracic and Lumbar spine with contrast TECHNIQUE: Multiplanar CT images of the thoracic and lumbar spine were reconstructed from contemporary CT of the Chest, Abdomen, and Pelvis. RADIATION DOSE REDUCTION: This exam was performed according to the departmental  dose-optimization program which includes automated exposure control, adjustment of the mA and/or kV according to patient size and/or use of iterative reconstruction technique. CONTRAST:  100 cc Omnipaque 300 COMPARISON:  CT chest/abdomen/pelvis 05/07/2020 and CT abdomen/pelvis 09/26/2020 FINDINGS: CT THORACIC SPINE FINDINGS Alignment: Normal. Vertebrae: There is a mild compression fracture of the T7 vertebral body which is new since 2021 and may be acute. There is no bony retropulsion or extension into the posterior elements. Vertebral body heights are otherwise preserved. There is no other evidence of acute fracture. There is no suspicious osseous lesion. Paraspinal and other soft tissues: The paraspinal soft tissues are unremarkable. The heart and lungs are assessed on the separately dictated CT chest. Disc levels: There is overall mild degenerative endplate change and facet arthropathy in the thoracic spine. There is no evidence of high-grade spinal canal or neural foraminal stenosis. CT LUMBAR SPINE FINDINGS Segmentation: Standard; the lowest formed disc space is designated L5-S1. Alignment: There is trace anterolisthesis of L4 on L5, unchanged since 2021. There is no evidence of traumatic malalignment. Vertebrae: Vertebral body  heights are preserved. There is no evidence of acute fracture. Schmorl's nodes are noted along the inferior L3 and L4 endplates. Paraspinal and other soft tissues: The paraspinal soft tissues are unremarkable. The abdominal and pelvic viscera are assessed on the separately dictated CT abdomen/pelvis. Disc levels: There is disc space narrowing and vacuum disc phenomenon at L5-S1. There is facet arthropathy at L4-L5 and L5-S1, advanced at L5-S1 contributing to moderate right worse than left neural foraminal stenosis. There is no other significant spinal canal or neural foraminal stenosis. IMPRESSION: 1. Mild compression fracture of the T7 vertebral body without bony retropulsion or extension to the posterior elements, new since 2021 and possibly acute. Correlate with symptoms. 2. No other evidence of acute injury in the thoracolumbar spine. 3. Degenerative changes at L5-S1 as above. Electronically Signed   By: Valetta Mole M.D.   On: 09/14/2022 14:13   CT L-SPINE NO CHARGE  Result Date: 09/14/2022 CLINICAL DATA:  Found down EXAM: CT Thoracic and Lumbar spine with contrast TECHNIQUE: Multiplanar CT images of the thoracic and lumbar spine were reconstructed from contemporary CT of the Chest, Abdomen, and Pelvis. RADIATION DOSE REDUCTION: This exam was performed according to the departmental dose-optimization program which includes automated exposure control, adjustment of the mA and/or kV according to patient size and/or use of iterative reconstruction technique. CONTRAST:  100 cc Omnipaque 300 COMPARISON:  CT chest/abdomen/pelvis 05/07/2020 and CT abdomen/pelvis 09/26/2020 FINDINGS: CT THORACIC SPINE FINDINGS Alignment: Normal. Vertebrae: There is a mild compression fracture of the T7 vertebral body which is new since 2021 and may be acute. There is no bony retropulsion or extension into the posterior elements. Vertebral body heights are otherwise preserved. There is no other evidence of acute fracture. There is no  suspicious osseous lesion. Paraspinal and other soft tissues: The paraspinal soft tissues are unremarkable. The heart and lungs are assessed on the separately dictated CT chest. Disc levels: There is overall mild degenerative endplate change and facet arthropathy in the thoracic spine. There is no evidence of high-grade spinal canal or neural foraminal stenosis. CT LUMBAR SPINE FINDINGS Segmentation: Standard; the lowest formed disc space is designated L5-S1. Alignment: There is trace anterolisthesis of L4 on L5, unchanged since 2021. There is no evidence of traumatic malalignment. Vertebrae: Vertebral body heights are preserved. There is no evidence of acute fracture. Schmorl's nodes are noted along the inferior L3 and L4 endplates. Paraspinal and other soft  tissues: The paraspinal soft tissues are unremarkable. The abdominal and pelvic viscera are assessed on the separately dictated CT abdomen/pelvis. Disc levels: There is disc space narrowing and vacuum disc phenomenon at L5-S1. There is facet arthropathy at L4-L5 and L5-S1, advanced at L5-S1 contributing to moderate right worse than left neural foraminal stenosis. There is no other significant spinal canal or neural foraminal stenosis. IMPRESSION: 1. Mild compression fracture of the T7 vertebral body without bony retropulsion or extension to the posterior elements, new since 2021 and possibly acute. Correlate with symptoms. 2. No other evidence of acute injury in the thoracolumbar spine. 3. Degenerative changes at L5-S1 as above. Electronically Signed   By: Valetta Mole M.D.   On: 09/14/2022 14:13   CT CHEST ABDOMEN PELVIS W CONTRAST  Result Date: 09/14/2022 CLINICAL DATA:  Patient found down breathing heavily. History of colostomy for colon cancer. EXAM: CT CHEST, ABDOMEN, AND PELVIS WITH CONTRAST TECHNIQUE: Multidetector CT imaging of the chest, abdomen and pelvis was performed following the standard protocol during bolus administration of intravenous  contrast. RADIATION DOSE REDUCTION: This exam was performed according to the departmental dose-optimization program which includes automated exposure control, adjustment of the mA and/or kV according to patient size and/or use of iterative reconstruction technique. CONTRAST:  146m OMNIPAQUE IOHEXOL 300 MG/ML  SOLN COMPARISON:  CT abdomen/pelvis 09/26/2020, CT chest/abdomen/pelvis 05/07/2020 FINDINGS: CT CHEST FINDINGS Cardiovascular: The heart size is normal. There is no pericardial effusion. There is mild calcified plaque in the thoracic aorta. The major vasculature is otherwise unremarkable. Mediastinum/Nodes: The imaged thyroid is unremarkable. The esophagus is grossly unremarkable. There is no mediastinal, hilar, or axillary lymphadenopathy. There is no mediastinal hematoma. Lungs/Pleura: The trachea and central airways are patent. There is no focal consolidation or pulmonary edema. There is no pleural effusion or pneumothorax. There are no suspicious nodules. There is no evidence of traumatic parenchymal injury. Musculoskeletal: There is no displaced rib or sternal fracture. There is mild compression deformity of the T7 vertebral body which is new since 2021. There is no bony retropulsion or extension into the posterior elements. There is no other evidence of acute fracture or traumatic malalignment of the thoracic spine. CT ABDOMEN PELVIS FINDINGS Hepatobiliary: The liver and gallbladder are unremarkable. There is no biliary ductal dilatation. There is no evidence of traumatic parenchymal injury. Pancreas: Unremarkable; no evidence of traumatic parenchymal injury. Spleen: Unremarkable; no evidence of traumatic parenchymal injury. Adrenals/Urinary Tract: The adrenals are unremarkable. The kidneys are unremarkable, with no focal lesion, stone, hydronephrosis, hydroureter, or evidence of traumatic parenchymal injury. There is symmetric excretion of contrast into the collecting systems on the delayed images.  Stomach/Bowel: The stomach is unremarkable. The patient is status post partial colectomy with end colostomy. There is no evidence of bowel obstruction or other complicating feature about the colostomy site. There is no abnormal bowel wall thickening or inflammatory change. There are a few scattered colonic diverticula without evidence of acute diverticulitis. Vascular/Lymphatic: There is calcified atherosclerotic plaque throughout the nonaneurysmal abdominal aorta. The major branch vessels are patent. The main portal and splenic veins are patent. There is no abdominopelvic lymphadenopathy. Reproductive: The uterus is surgically absent. There is no adnexal mass. Other: There is no ascites or free air. There is prominent soft tissue thickening in the presacral space which is similar to the studies from 2021, favored to reflect scar. There is no fluid collection or abnormal enhancement. Musculoskeletal: There is no acute osseous abnormality or suspicious osseous lesion. IMPRESSION: 1. Mild compression fracture of  the T7 vertebral body without bony retropulsion or extension to the posterior elements, new since 2021 and possibly acute. Correlate with symptoms. 2. No other evidence of acute traumatic injury in the chest, abdomen, or pelvis. 3. Soft tissue thickening in the presacral space is similar to 2021 and likely reflects scarring. 4. Status post partial colectomy with end colostomy. No evidence of bowel obstruction or other complicating feature. Electronically Signed   By: Valetta Mole M.D.   On: 09/14/2022 14:00   CT HEAD WO CONTRAST (5MM)  Result Date: 09/14/2022 CLINICAL DATA:  Unwitnessed fall. EXAM: CT HEAD WITHOUT CONTRAST CT CERVICAL SPINE WITHOUT CONTRAST TECHNIQUE: Multidetector CT imaging of the head and cervical spine was performed following the standard protocol without intravenous contrast. Multiplanar CT image reconstructions of the cervical spine were also generated. RADIATION DOSE REDUCTION: This  exam was performed according to the departmental dose-optimization program which includes automated exposure control, adjustment of the mA and/or kV according to patient size and/or use of iterative reconstruction technique. COMPARISON:  None Available. FINDINGS: CT HEAD FINDINGS Brain: No evidence of acute infarction, hemorrhage, hydrocephalus, extra-axial collection or mass lesion/mass effect. Vascular: No hyperdense vessel or unexpected calcification. Skull: Normal. Negative for fracture or focal lesion. Sinuses/Orbits: No acute finding. Other: None. CT CERVICAL SPINE FINDINGS Alignment: Normal. Skull base and vertebrae: No acute fracture. No primary bone lesion or focal pathologic process. Soft tissues and spinal canal: No prevertebral fluid or swelling. No visible canal hematoma. Disc levels: Moderate degenerative disc disease is noted at C4-5 and C5-6. Upper chest: Negative. Other: None. IMPRESSION: No acute intracranial abnormality seen. Moderate multilevel degenerative disc disease. No acute abnormality seen in the cervical spine. Electronically Signed   By: Marijo Conception M.D.   On: 09/14/2022 13:50   CT Cervical Spine Wo Contrast  Result Date: 09/14/2022 CLINICAL DATA:  Unwitnessed fall. EXAM: CT HEAD WITHOUT CONTRAST CT CERVICAL SPINE WITHOUT CONTRAST TECHNIQUE: Multidetector CT imaging of the head and cervical spine was performed following the standard protocol without intravenous contrast. Multiplanar CT image reconstructions of the cervical spine were also generated. RADIATION DOSE REDUCTION: This exam was performed according to the departmental dose-optimization program which includes automated exposure control, adjustment of the mA and/or kV according to patient size and/or use of iterative reconstruction technique. COMPARISON:  None Available. FINDINGS: CT HEAD FINDINGS Brain: No evidence of acute infarction, hemorrhage, hydrocephalus, extra-axial collection or mass lesion/mass effect.  Vascular: No hyperdense vessel or unexpected calcification. Skull: Normal. Negative for fracture or focal lesion. Sinuses/Orbits: No acute finding. Other: None. CT CERVICAL SPINE FINDINGS Alignment: Normal. Skull base and vertebrae: No acute fracture. No primary bone lesion or focal pathologic process. Soft tissues and spinal canal: No prevertebral fluid or swelling. No visible canal hematoma. Disc levels: Moderate degenerative disc disease is noted at C4-5 and C5-6. Upper chest: Negative. Other: None. IMPRESSION: No acute intracranial abnormality seen. Moderate multilevel degenerative disc disease. No acute abnormality seen in the cervical spine. Electronically Signed   By: Marijo Conception M.D.   On: 09/14/2022 13:50   DG Chest Portable 1 View  Result Date: 09/14/2022 CLINICAL DATA:  Found down breathing heavily. EXAM: PORTABLE CHEST 1 VIEW COMPARISON:  Chest radiograph 09/25/2020 FINDINGS: The cardiomediastinal silhouette is normal. There is no focal consolidation or pulmonary edema. There is no pleural effusion or pneumothorax. There is no acute osseous abnormality. IMPRESSION: No radiographic evidence of acute cardiopulmonary process. Electronically Signed   By: Valetta Mole M.D.   On: 09/14/2022 13:23  Procedures Procedures    Medications Ordered in ED Medications  HYDROmorphone (DILAUDID) injection 1 mg (1 mg Intravenous Given 09/14/22 1306)  prochlorperazine (COMPAZINE) injection 10 mg (10 mg Intravenous Given 09/14/22 1305)  lactated ringers bolus 1,000 mL (0 mLs Intravenous Stopped 09/14/22 1445)  iohexol (OMNIPAQUE) 300 MG/ML solution 100 mL (100 mLs Intravenous Contrast Given 09/14/22 1342)    ED Course/ Medical Decision Making/ A&P Clinical Course as of 09/14/22 [place] Sep 14, 2022  1638 Neurosurgery called called. Please call Weston Brass back [GL]    Clinical Course User Index [GL] Sherre Poot Adora Fridge, PA-C                           Medical Decision Making Amount and/or  Complexity of Data Reviewed Labs: ordered. Radiology: ordered.  Risk Prescription drug management.     76 year old female with medical history significant for HLD, anxiety, rectal cancer status post partial colectomy and colostomy with Dr. Morton Stall of [facility], UTI who presents to the emergency department with concern for syncope.  The history is provided with the patient and the patient's husband.  They have noted decreased output from the patient's ostomy over the past few days.  She denies any abdominal pain, nausea or vomiting.  Due to this concern, her surgeon office had recommended that she present to the emergency department for further evaluation at [facility].  While getting ready to head to the ER by POV, the patient had an unwitnessed syncopal episode.  She was found lying on the ground with generalized shaking and some "foaming at the mouth."  She subsequently had a period of confusion described by EMS as Pote ictal.  She denies any tongue biting or urinary incontinence.  She is completely amnestic to the event.  She did experience head trauma and has a hematoma to her forehead.  She denies any neck pain, endorses a mild headache.  She is not on anticoagulation.  Since the fall, she is complained of midthoracic back pain.  She continues to endorse concern regarding decreased output from her ostomy.  She denies any other injuries or complaints.  She denies any neurologic deficits, no numbness, facial droop, weakness.  She arrived to the emergency department GCS 15, ABC intact.  On arrival, the patient was afebrile, hypertensive BP 175/74, otherwise hemodynamically stable.  Sinus rhythm noted on cardiac telemetry.  Presenting to the emergency department after a questionable syncopal episode versus seizure.  Patient had no tongue biting or urinary incontinence but had some generalized shaking after she was found down on the ground with some "foaming at the mouth."  No clear history of  seizures and not on AEDs.  Has had several episodes of syncope per the patient's husband and per chart review of the patient's EMR over the past few months that the patient thought was due to discomfort associated with her colostomy triggering vasovagal episodes.  On exam, the patient had midline tenderness to palpation of the thoracic spine, had an intact neurologic exam with no focal deficits noted, evidence of head trauma with a hematoma to the left forehead present.  The patient is not on anticoagulation.   Differential diagnosis includes syncope, seizure, thoracic vertebral injury, less likely CVA, electrolyte abnormality.  Considered hypovolemia and dehydration, considered vasovagal episode. Considered traumatic SAH/ICH vs concussion. Considered bowel obstruction in the setting of decreased ostomy output.  Trauma work-up initiated to include CT head and cervical spine which resulted  negative for acute intracranial abnormality.  No evidence of fracture or malalignment of the cervical spine noted.  CT chest abdomen pelvis performed which revealed evidence of T7 compression fracture with no bony retropulsion.  The patient is neurologically intact.  Neurosurgery recommended to arrange for potential outpatient management as this is a stable fracture.  There is no evidence of bowel obstruction or other acute intra-abdominal abnormality noted on CT.  IMPRESSION:  1. Mild compression fracture of the T7 vertebral body without bony  retropulsion or extension to the posterior elements, new since 2021  and possibly acute. Correlate with symptoms.  2. No other evidence of acute traumatic injury in the chest,  abdomen, or pelvis.  3. Soft tissue thickening in the presacral space is similar to 2021  and likely reflects scarring.  4. Status post partial colectomy with end colostomy. No evidence of  bowel obstruction or other complicating feature.    I discussed the plan of likely admission for syncope  work-up versus evaluation for possible seizure with the patient.  The patient and her husband would strongly prefer admission to [facility] where they receive all of their surgical care.  I did call the physician access line at [facility] and there are currently no medicine beds available.  Patient was put out to Dr. Morton Stall of general surgery to see if he would accept the patient to his service, pending callback from [facility] at time of signout.  If no availability or not amenable to transfer, plan for likely medical admission for syncope work-up at [facility].  The patient and her husband were updated regarding the plan of care.  Signout given to Dr. Vanita Panda at 1600.   Final Clinical Impression(s) / ED Diagnoses Final diagnoses:  Fall, initial encounter  Syncope, unspecified syncope type  Compression fracture of T7 vertebra, initial encounter ([place])  Traumatic hematoma of forehead, initial encounter    Rx / DC Orders ED Discharge Orders     None         Regan Lemming, MD 09/14/22 1744    Regan Lemming, MD 09/14/22 1747

## 2022-09-14 NOTE — Assessment & Plan Note (Signed)
Continue tamoxifen 10 mg daily.

## 2022-09-15 ENCOUNTER — Other Ambulatory Visit: Payer: Self-pay | Admitting: Cardiology

## 2022-09-15 ENCOUNTER — Observation Stay (HOSPITAL_COMMUNITY)
Admit: 2022-09-15 | Discharge: 2022-09-15 | Disposition: A | Discharge status: Home / Self Care | Payer: PPO | Attending: Internal Medicine | Admitting: Internal Medicine

## 2022-09-15 ENCOUNTER — Observation Stay (HOSPITAL_BASED_OUTPATIENT_CLINIC_OR_DEPARTMENT_OTHER): Payer: PPO

## 2022-09-15 ENCOUNTER — Observation Stay (HOSPITAL_COMMUNITY): Payer: PPO

## 2022-09-15 DIAGNOSIS — S22060A Wedge compression fracture of T7-T8 vertebra, initial encounter for closed fracture: Secondary | ICD-10-CM

## 2022-09-15 DIAGNOSIS — R55 Syncope and collapse: Secondary | ICD-10-CM

## 2022-09-15 DIAGNOSIS — W19XXXA Unspecified fall, initial encounter: Secondary | ICD-10-CM

## 2022-09-15 LAB — COMPREHENSIVE METABOLIC PANEL
ALT: 14 U/L (ref 0–44)
AST: 30 U/L (ref 15–41)
Albumin: 3.3 g/dL — ABNORMAL LOW (ref 3.5–5.0)
Alkaline Phosphatase: 32 U/L — ABNORMAL LOW (ref 38–126)
Anion gap: 4 — ABNORMAL LOW (ref 5–15)
BUN: 6 mg/dL — ABNORMAL LOW (ref 8–23)
CO2: 27 mmol/L (ref 22–32)
Calcium: 8 mg/dL — ABNORMAL LOW (ref 8.9–10.3)
Chloride: 109 mmol/L (ref 98–111)
Creatinine, Ser: 0.54 mg/dL (ref 0.44–1.00)
GFR, Estimated: >60 mL/min (ref >60)
Glucose, Bld: 113 mg/dL — ABNORMAL HIGH (ref 70–99)
Potassium: 3.2 mmol/L — ABNORMAL LOW (ref 3.5–5.1)
Sodium: 140 mmol/L (ref 135–145)
Total Bilirubin: 1.5 mg/dL — ABNORMAL HIGH (ref 0.3–1.2)
Total Protein: 6.1 g/dL — ABNORMAL LOW (ref 6.5–8.1)

## 2022-09-15 LAB — ECHOCARDIOGRAM COMPLETE
Area-P 1/2: 3.27 cm2
Calc EF: 70.6 %
Height: 67 in
S' Lateral: 2.8 cm
Single Plane A2C EF: 70.2 %
Single Plane A4C EF: 70.8 %
Weight: 2240 oz

## 2022-09-15 LAB — MAGNESIUM: Magnesium: 2.1 mg/dL (ref 1.7–2.4)

## 2022-09-15 LAB — CBC
HCT: 35.3 % — ABNORMAL LOW (ref 36.0–46.0)
Hemoglobin: 11.5 g/dL — ABNORMAL LOW (ref 12.0–15.0)
MCH: 30.2 pg (ref 26.0–34.0)
MCHC: 32.6 g/dL (ref 30.0–36.0)
MCV: 92.7 fL (ref 80.0–100.0)
Platelets: 167 10*3/uL (ref 150–400)
RBC: 3.81 MIL/uL — ABNORMAL LOW (ref 3.87–5.11)
RDW: 12.5 % (ref 11.5–15.5)
WBC: 5.9 10*3/uL (ref 4.0–10.5)
nRBC: 0 % (ref 0.0–0.2)

## 2022-09-15 LAB — TSH: TSH: 0.453 u[IU]/mL (ref 0.350–4.500)

## 2022-09-15 MED ORDER — POTASSIUM CHLORIDE CRYS ER 20 MEQ PO TBCR
60.0000 meq | EXTENDED_RELEASE_TABLET | Freq: Once | ORAL | Status: AC
  Administered 2022-09-15: 60 meq via ORAL

## 2022-09-15 MED ORDER — AMLODIPINE BESYLATE 5 MG PO TABS: 5.0000 mg | ORAL_TABLET | Freq: Every day | ORAL | 0 refills | Status: DC

## 2022-09-15 MED ORDER — POTASSIUM CHLORIDE CRYS ER 20 MEQ PO TBCR
60.0000 meq | EXTENDED_RELEASE_TABLET | ORAL | Status: AC
  Administered 2022-09-15: 60 meq via ORAL
  Filled 2022-09-15 (×2): qty 3

## 2022-09-15 MED ORDER — GADOPICLENOL 0.5 MMOL/ML IV SOLN
6.0000 mL | Freq: Once | INTRAVENOUS | Status: AC | PRN
  Administered 2022-09-15: 6 mL via INTRAVENOUS

## 2022-09-15 MED ORDER — INFLUENZA VAC A&B SA ADJ QUAD 0.5 ML IM PRSY: 0.5000 mL | PREFILLED_SYRINGE | INTRAMUSCULAR | Status: DC

## 2022-09-15 MED ORDER — AMLODIPINE BESYLATE 5 MG PO TABS: 5.0000 mg | ORAL_TABLET | Freq: Every day | ORAL | Status: DC

## 2022-09-15 MED ORDER — HYDRALAZINE HCL 20 MG/ML IJ SOLN
10.0000 mg | INTRAMUSCULAR | Status: DC | PRN
  Administered 2022-09-15: 10 mg via INTRAVENOUS
  Filled 2022-09-15: qty 1

## 2022-09-15 MED ORDER — PERFLUTREN LIPID MICROSPHERE
1.0000 mL | INTRAVENOUS | Status: AC | PRN
  Administered 2022-09-15: 2 mL via INTRAVENOUS

## 2022-09-15 NOTE — Hospital Course (Signed)
76 year old female history of rectal cancer status post colostomy after having to fistulous, recent diagnosis of breast cancer, hypothyroidism presents to the ER today with syncope/collapse.  Patient states that she has noticed decreased colostomy output over the last couple days.  She is also noticed more herniation of her colostomy stoma.   This morning, patient states that she was sitting in her chair/ottoman.  There is early morning.  She states the next thing she knew she woke up with her glasses falling off her face.  She does not know how this happened.
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
## 2022-09-15 NOTE — Progress Notes (Signed)
Kathleen Flores to be D/C'd Home per MD order.  Discussed with the patient and all questions fully answered.  VSS, Skin clean, dry and intact without evidence of skin break down, no evidence of skin tears noted. IV catheter discontinued intact. Site without signs and symptoms of complications. Dressing and pressure applied.  An After Visit Summary was printed and given to the patient. Patient received prescription.  D/c education completed with patient/family including follow up instructions, medication list, d/c activities limitations if indicated, with other d/c instructions as indicated by MD - patient able to verbalize understanding, all questions fully answered.   Patient instructed to return to ED, call 911, or call MD for any changes in condition.   Patient home wife spouse via private auto.  Kathleen Flores 09/15/2022 5:40 PM

## 2022-09-15 NOTE — Progress Notes (Signed)
  Echocardiogram 2D Echocardiogram has been performed.  Kathleen Flores 09/15/2022, 9:33 AM

## 2022-09-15 NOTE — Discharge Summary (Signed)
Physician Discharge Summary   Patient: Kathleen Flores MRN: [id number] DOB: 1946-03-28  Admit date:     09/14/2022  Discharge date: 09/15/22  Discharge Physician: Marylu Lund   PCP: [name], FNP   Recommendations at discharge:    Follow up with PCP in 1-2 weeks Follow up with primary surgeon as scheuled  Discharge Diagnoses: Principal Problem:   Syncope and collapse Active Problems:   Acquired hypothyroidism   Ductal carcinoma in situ (DCIS) of left breast   S/P colostomy ([place])  Resolved Problems:   * No resolved hospital problems. *  Hospital Course: 76 year old female history of rectal cancer status post colostomy after having to fistulous, recent diagnosis of breast cancer, hypothyroidism presents to the ER today with syncope/collapse.  Patient states that she has noticed decreased colostomy output over the last couple days.  She is also noticed more herniation of her colostomy stoma.   This morning, patient states that she was sitting in her chair/ottoman.  There is early morning.  She states the next thing she knew she woke up with her glasses falling off her face.  She does not know how this happened.  Assessment and Plan: * Syncope and collapse Observation med/tele bed. Neurology consulted. Echo, MRI brain and EEG performed, unremarkable -Per neurology, given hx of vasovagal events in past, hypoperfusion is significant possibility -Discussed with Neurology later in day, recs for outpatient Neurology if she continues to have presenting episodes. No need for AED's -Discussed with Cardiology who will arrange outpatient cardiac monitoring   S/P colostomy ([place]) -Pt states she normally has high output colostomy drainage. Takes immodium multiple times a day. Has noticed her colostomy stoma protruding into her colostomy bag which is abnormal for her.  CT abd negative. P -Pt to f/u with primary surgeon  Ductal carcinoma in situ (DCIS) of left breast Continue tamoxifen  10 mg daily.   Acquired hypothyroidism Continue synthroid 125 mcg daily.       Consultants: Neurology Procedures performed: EEG  Disposition: Home Diet recommendation:  Regular diet DISCHARGE MEDICATION: Allergies as of 09/15/2022       Reactions   Metoprolol Swelling, Other (See Comments)   Swelling of the lips (Toprol XL)   Statins Other (See Comments)   Muscle aches/pain   Demerol [meperidine Hcl] Nausea And Vomiting        Medication List     TAKE these medications    ascorbic acid 500 MG tablet Commonly known as: VITAMIN C Take 500 mg by mouth daily.   Calcium+D3 600-20 MG-MCG Tabs Generic drug: Calcium Carb-Cholecalciferol Take 1 tablet by mouth daily.   diphenoxylate-atropine 2.5-0.025 MG tablet Commonly known as: Lomotil Take 1 tablet 3 times daily as needed What changed:  how much to take how to take this when to take this reasons to take this additional instructions   L-GLUTAMINE PO Take by mouth.   levothyroxine 112 MCG tablet Commonly known as: SYNTHROID Take 112 mcg by mouth daily before breakfast.   loperamide 2 MG capsule Commonly known as: IMODIUM TAKE 1 CAPSULE BY MOUTH EVERY MORNING, 2 CAPSULES AT LUNCH, AND 1 CAPSULE AT NIGHT What changed:  how much to take when to take this additional instructions   SenSura Drainable Pouch Misc Use when changing pouching system   tamoxifen 10 MG tablet Commonly known as: NOLVADEX TAKE ONE TABLET BY MOUTH DAILY   Vitamin B-12 5000 MCG Subl Place 5,000 mcg under the tongue daily.   zinc gluconate 50 MG tablet Take  50 mg by mouth daily.        Follow-up Information     [name], FNP Follow up in 1 week(s).   Specialty: Nurse Practitioner Why: Hospital follow up Contact information: [address] [place] [phone]                Discharge Exam: Filed Weights   09/14/22 1208  Weight: 63.5 kg   General exam: Awake, laying in  bed, in nad Respiratory system: Normal respiratory effort, no wheezing Cardiovascular system: regular rate, s1, s2 Gastrointestinal system: Soft, nondistended, positive BS Central nervous system: CN2-12 grossly intact, strength intact Extremities: Perfused, no clubbing Skin: Normal skin turgor, no notable skin lesions seen Psychiatry: Mood normal // no visual hallucinations   Condition at discharge: fair  The results of significant diagnostics from this hospitalization (including imaging, microbiology, ancillary and laboratory) are listed below for reference.   Imaging Studies: EEG adult  Result Date: 05-Oct-2022 Lora Havens, MD     October 05, 2022  4:36 PM Patient Name: Kathleen Flores MRN: [id number] Epilepsy Attending: Lora Havens Referring Physician/Provider: Kristopher Oppenheim, DO Date: 05-Oct-2022 Duration: 26.24 mins Patient history: 76 year old female with multiple episodes of syncope over the past few days. EEG to evaluate for seizure Level of alertness: Awake AEDs during EEG study: None Technical aspects: This EEG study was done with scalp electrodes positioned according to the 10-20 International system of electrode placement. Electrical activity was reviewed with band pass filter of 1-'70Hz'$ , sensitivity of 7 uV/mm, display speed of 24m/sec with a '60Hz'$  notched filter applied as appropriate. EEG data were recorded continuously and digitally stored.  Video monitoring was available and reviewed as appropriate. Description: The posterior dominant rhythm consists of 9 Hz activity of moderate voltage (25-35 uV) seen predominantly in posterior head regions, symmetric and reactive to eye opening and eye closing. Physiologic photic driving was seen during photic stimulation.  Hyperventilation was not performed.   IMPRESSION: This study is within normal limits. No seizures or epileptiform discharges were seen throughout the recording. A normal interictal EEG does not exclude the diagnosis of epilepsy.  PLora Havens  ECHOCARDIOGRAM COMPLETE  Result Date: 110/24/23   ECHOCARDIOGRAM REPORT   Patient Name:   Kathleen MCELHANEYDate of Exam: 124-Oct-2023Medical Rec #:  [id number]      Height:       67.0 in Accession #:    [id number]     Weight:       140.0 lb Date of Birth:  7September 30, 1947      BSA:          1.738 m Patient Age:    89years        BP:           194/77 mmHg Patient Gender: F               HR:           89 bpm. Exam Location:  Inpatient Procedure: 2D Echo, Color Doppler, Cardiac Doppler and Intracardiac            Opacification Agent Indications:    Syncope  History:        Patient has no prior history of Echocardiogram examinations.                 Signs/Symptoms:Murmur; Risk Factors:Hypertension and  Dyslipidemia. HCC, Hx radiation therapy, [place] HTN                 syndrome.  Sonographer:    Eartha Inch Referring Phys: [phone] ERIC CHEN  Sonographer Comments: Technically difficult study due to poor echo windows. Image acquisition challenging due to patient body habitus, Image acquisition challenging due to respiratory motion and Image acquisition challenging due to uncooperative patient. IMPRESSIONS  1. Left ventricular ejection fraction, by estimation, is 65 to 70%. The left ventricle has normal function. The left ventricle has no regional wall motion abnormalities. There is mild concentric left ventricular hypertrophy. Left ventricular diastolic parameters are consistent with Grade I diastolic dysfunction (impaired relaxation).  2. Right ventricular systolic function is normal. The right ventricular size is normal. There is normal pulmonary artery systolic pressure. The estimated right ventricular systolic pressure is 96.2 mmHg.  3. The mitral valve is normal in structure. Trivial mitral valve regurgitation. No evidence of mitral stenosis.  4. The aortic valve is tricuspid. There is mild calcification of the aortic valve. Aortic valve regurgitation is not visualized. No  aortic stenosis is present.  5. Aortic dilatation noted. There is mild dilatation of the aortic root, measuring 37 mm.  6. The inferior vena cava is dilated in size with >50% respiratory variability, suggesting right atrial pressure of 8 mmHg. FINDINGS  Left Ventricle: Left ventricular ejection fraction, by estimation, is 65 to 70%. The left ventricle has normal function. The left ventricle has no regional wall motion abnormalities. Definity contrast agent was given IV to delineate the left ventricular  endocardial borders. The left ventricular internal cavity size was normal in size. There is mild concentric left ventricular hypertrophy. Left ventricular diastolic parameters are consistent with Grade I diastolic dysfunction (impaired relaxation). Right Ventricle: The right ventricular size is normal. No increase in right ventricular wall thickness. Right ventricular systolic function is normal. There is normal pulmonary artery systolic pressure. The tricuspid regurgitant velocity is 2.04 m/s, and  with an assumed right atrial pressure of 8 mmHg, the estimated right ventricular systolic pressure is 95.2 mmHg. Left Atrium: Left atrial size was normal in size. Right Atrium: Right atrial size was normal in size. Pericardium: Trivial pericardial effusion is present. Mitral Valve: The mitral valve is normal in structure. Trivial mitral valve regurgitation. No evidence of mitral valve stenosis. Tricuspid Valve: The tricuspid valve is normal in structure. Tricuspid valve regurgitation is trivial. Aortic Valve: The aortic valve is tricuspid. There is mild calcification of the aortic valve. Aortic valve regurgitation is not visualized. No aortic stenosis is present. Pulmonic Valve: The pulmonic valve was normal in structure. Pulmonic valve regurgitation is mild. Aorta: Aortic dilatation noted. There is mild dilatation of the aortic root, measuring 37 mm. Venous: The inferior vena cava is dilated in size with greater than 50%  respiratory variability, suggesting right atrial pressure of 8 mmHg. IAS/Shunts: No atrial level shunt detected by color flow Doppler.  LEFT VENTRICLE PLAX 2D LVIDd:         3.50 cm     Diastology LVIDs:         2.80 cm     LV e' medial:    7.62 cm/s LV PW:         1.00 cm     LV E/e' medial:  10.0 LV IVS:        1.00 cm     LV e' lateral:   6.31 cm/s LVOT diam:     2.00 cm  LV E/e' lateral: 12.1 LV SV:         53 LV SV Index:   31 LVOT Area:     3.14 cm  LV Volumes (MOD) LV vol d, MOD A2C: 48.0 ml LV vol d, MOD A4C: 51.4 ml LV vol s, MOD A2C: 14.3 ml LV vol s, MOD A4C: 15.0 ml LV SV MOD A2C:     33.7 ml LV SV MOD A4C:     51.4 ml LV SV MOD BP:      35.9 ml RIGHT VENTRICLE             IVC RV S prime:     20.50 cm/s  IVC diam: 2.30 cm LEFT ATRIUM             Index        RIGHT ATRIUM           Index LA diam:        3.30 cm 1.90 cm/m   RA Area:     10.60 cm LA Vol (A2C):   37.4 ml 21.52 ml/m  RA Volume:   22.10 ml  12.72 ml/m LA Vol (A4C):   33.0 ml 18.99 ml/m LA Biplane Vol: 35.1 ml 20.20 ml/m  AORTIC VALVE LVOT Vmax:   95.30 cm/s LVOT Vmean:  68.700 cm/s LVOT VTI:    0.170 m  AORTA Ao Root diam: 3.70 cm Ao Asc diam:  3.20 cm MITRAL VALVE               TRICUSPID VALVE MV Area (PHT): 3.27 cm    TR Peak grad:   16.6 mmHg MV Decel Time: 232 msec    TR Vmax:        204.00 cm/s MV E velocity: 76.50 cm/s MV A velocity: 82.60 cm/s  SHUNTS MV E/A ratio:  0.93        Systemic VTI:  0.17 m                            Systemic Diam: 2.00 cm Dalton McleanMD Electronically signed by Franki Monte Signature Date/Time: 09/15/2022/1:03:28 PM    Final    MR BRAIN W WO CONTRAST  Result Date: 09/15/2022 CLINICAL DATA:  Syncope/presyncope with cerebrovascular cause suspected. EXAM: MRI HEAD WITHOUT AND WITH CONTRAST TECHNIQUE: Multiplanar, multiecho pulse sequences of the brain and surrounding structures were obtained without and with intravenous contrast. CONTRAST:  6 cc of vueway intravenous COMPARISON:  Head CT  yesterday FINDINGS: Brain: No acute infarction, hemorrhage, hydrocephalus, extra-axial collection or mass lesion. Few small FLAIR hyperintensities scattered in the cerebral white matter, likely chronic microvascular ischemia given age and medical history. Vascular: Normal flow voids. Skull and upper cervical spine: Normal marrow signal. Sinuses/Orbits: Negative. Other: Motion degradation towards the into the exam. IMPRESSION: No acute finding or explanation for symptoms. Electronically Signed   By: Jorje Guild M.D.   On: 09/15/2022 08:01   CT T-SPINE NO CHARGE  Result Date: 09/14/2022 CLINICAL DATA:  Found down EXAM: CT Thoracic and Lumbar spine with contrast TECHNIQUE: Multiplanar CT images of the thoracic and lumbar spine were reconstructed from contemporary CT of the Chest, Abdomen, and Pelvis. RADIATION DOSE REDUCTION: This exam was performed according to the departmental dose-optimization program which includes automated exposure control, adjustment of the mA and/or kV according to patient size and/or use of iterative reconstruction technique. CONTRAST:  100 cc Omnipaque 300 COMPARISON:  CT chest/abdomen/pelvis 05/07/2020 and CT abdomen/pelvis  09/26/2020 FINDINGS: CT THORACIC SPINE FINDINGS Alignment: Normal. Vertebrae: There is a mild compression fracture of the T7 vertebral body which is new since 2021 and may be acute. There is no bony retropulsion or extension into the posterior elements. Vertebral body heights are otherwise preserved. There is no other evidence of acute fracture. There is no suspicious osseous lesion. Paraspinal and other soft tissues: The paraspinal soft tissues are unremarkable. The heart and lungs are assessed on the separately dictated CT chest. Disc levels: There is overall mild degenerative endplate change and facet arthropathy in the thoracic spine. There is no evidence of high-grade spinal canal or neural foraminal stenosis. CT LUMBAR SPINE FINDINGS Segmentation: Standard;  the lowest formed disc space is designated L5-S1. Alignment: There is trace anterolisthesis of L4 on L5, unchanged since 2021. There is no evidence of traumatic malalignment. Vertebrae: Vertebral body heights are preserved. There is no evidence of acute fracture. Schmorl's nodes are noted along the inferior L3 and L4 endplates. Paraspinal and other soft tissues: The paraspinal soft tissues are unremarkable. The abdominal and pelvic viscera are assessed on the separately dictated CT abdomen/pelvis. Disc levels: There is disc space narrowing and vacuum disc phenomenon at L5-S1. There is facet arthropathy at L4-L5 and L5-S1, advanced at L5-S1 contributing to moderate right worse than left neural foraminal stenosis. There is no other significant spinal canal or neural foraminal stenosis. IMPRESSION: 1. Mild compression fracture of the T7 vertebral body without bony retropulsion or extension to the posterior elements, new since 2021 and possibly acute. Correlate with symptoms. 2. No other evidence of acute injury in the thoracolumbar spine. 3. Degenerative changes at L5-S1 as above. Electronically Signed   By: Valetta Mole M.D.   On: 09/14/2022 14:13   CT L-SPINE NO CHARGE  Result Date: 09/14/2022 CLINICAL DATA:  Found down EXAM: CT Thoracic and Lumbar spine with contrast TECHNIQUE: Multiplanar CT images of the thoracic and lumbar spine were reconstructed from contemporary CT of the Chest, Abdomen, and Pelvis. RADIATION DOSE REDUCTION: This exam was performed according to the departmental dose-optimization program which includes automated exposure control, adjustment of the mA and/or kV according to patient size and/or use of iterative reconstruction technique. CONTRAST:  100 cc Omnipaque 300 COMPARISON:  CT chest/abdomen/pelvis 05/07/2020 and CT abdomen/pelvis 09/26/2020 FINDINGS: CT THORACIC SPINE FINDINGS Alignment: Normal. Vertebrae: There is a mild compression fracture of the T7 vertebral body which is new since  2021 and may be acute. There is no bony retropulsion or extension into the posterior elements. Vertebral body heights are otherwise preserved. There is no other evidence of acute fracture. There is no suspicious osseous lesion. Paraspinal and other soft tissues: The paraspinal soft tissues are unremarkable. The heart and lungs are assessed on the separately dictated CT chest. Disc levels: There is overall mild degenerative endplate change and facet arthropathy in the thoracic spine. There is no evidence of high-grade spinal canal or neural foraminal stenosis. CT LUMBAR SPINE FINDINGS Segmentation: Standard; the lowest formed disc space is designated L5-S1. Alignment: There is trace anterolisthesis of L4 on L5, unchanged since 2021. There is no evidence of traumatic malalignment. Vertebrae: Vertebral body heights are preserved. There is no evidence of acute fracture. Schmorl's nodes are noted along the inferior L3 and L4 endplates. Paraspinal and other soft tissues: The paraspinal soft tissues are unremarkable. The abdominal and pelvic viscera are assessed on the separately dictated CT abdomen/pelvis. Disc levels: There is disc space narrowing and vacuum disc phenomenon at L5-S1. There is facet arthropathy  at L4-L5 and L5-S1, advanced at L5-S1 contributing to moderate right worse than left neural foraminal stenosis. There is no other significant spinal canal or neural foraminal stenosis. IMPRESSION: 1. Mild compression fracture of the T7 vertebral body without bony retropulsion or extension to the posterior elements, new since 2021 and possibly acute. Correlate with symptoms. 2. No other evidence of acute injury in the thoracolumbar spine. 3. Degenerative changes at L5-S1 as above. Electronically Signed   By: Valetta Mole M.D.   On: 09/14/2022 14:13   CT CHEST ABDOMEN PELVIS W CONTRAST  Result Date: 09/14/2022 CLINICAL DATA:  Patient found down breathing heavily. History of colostomy for colon cancer. EXAM: CT  CHEST, ABDOMEN, AND PELVIS WITH CONTRAST TECHNIQUE: Multidetector CT imaging of the chest, abdomen and pelvis was performed following the standard protocol during bolus administration of intravenous contrast. RADIATION DOSE REDUCTION: This exam was performed according to the departmental dose-optimization program which includes automated exposure control, adjustment of the mA and/or kV according to patient size and/or use of iterative reconstruction technique. CONTRAST:  144m OMNIPAQUE IOHEXOL 300 MG/ML  SOLN COMPARISON:  CT abdomen/pelvis 09/26/2020, CT chest/abdomen/pelvis 05/07/2020 FINDINGS: CT CHEST FINDINGS Cardiovascular: The heart size is normal. There is no pericardial effusion. There is mild calcified plaque in the thoracic aorta. The major vasculature is otherwise unremarkable. Mediastinum/Nodes: The imaged thyroid is unremarkable. The esophagus is grossly unremarkable. There is no mediastinal, hilar, or axillary lymphadenopathy. There is no mediastinal hematoma. Lungs/Pleura: The trachea and central airways are patent. There is no focal consolidation or pulmonary edema. There is no pleural effusion or pneumothorax. There are no suspicious nodules. There is no evidence of traumatic parenchymal injury. Musculoskeletal: There is no displaced rib or sternal fracture. There is mild compression deformity of the T7 vertebral body which is new since 2021. There is no bony retropulsion or extension into the posterior elements. There is no other evidence of acute fracture or traumatic malalignment of the thoracic spine. CT ABDOMEN PELVIS FINDINGS Hepatobiliary: The liver and gallbladder are unremarkable. There is no biliary ductal dilatation. There is no evidence of traumatic parenchymal injury. Pancreas: Unremarkable; no evidence of traumatic parenchymal injury. Spleen: Unremarkable; no evidence of traumatic parenchymal injury. Adrenals/Urinary Tract: The adrenals are unremarkable. The kidneys are unremarkable,  with no focal lesion, stone, hydronephrosis, hydroureter, or evidence of traumatic parenchymal injury. There is symmetric excretion of contrast into the collecting systems on the delayed images. Stomach/Bowel: The stomach is unremarkable. The patient is status post partial colectomy with end colostomy. There is no evidence of bowel obstruction or other complicating feature about the colostomy site. There is no abnormal bowel wall thickening or inflammatory change. There are a few scattered colonic diverticula without evidence of acute diverticulitis. Vascular/Lymphatic: There is calcified atherosclerotic plaque throughout the nonaneurysmal abdominal aorta. The major branch vessels are patent. The main portal and splenic veins are patent. There is no abdominopelvic lymphadenopathy. Reproductive: The uterus is surgically absent. There is no adnexal mass. Other: There is no ascites or free air. There is prominent soft tissue thickening in the presacral space which is similar to the studies from 2021, favored to reflect scar. There is no fluid collection or abnormal enhancement. Musculoskeletal: There is no acute osseous abnormality or suspicious osseous lesion. IMPRESSION: 1. Mild compression fracture of the T7 vertebral body without bony retropulsion or extension to the posterior elements, new since 2021 and possibly acute. Correlate with symptoms. 2. No other evidence of acute traumatic injury in the chest, abdomen, or pelvis. 3.  Soft tissue thickening in the presacral space is similar to 2021 and likely reflects scarring. 4. Status post partial colectomy with end colostomy. No evidence of bowel obstruction or other complicating feature. Electronically Signed   By: Valetta Mole M.D.   On: 09/14/2022 14:00   CT HEAD WO CONTRAST (5MM)  Result Date: 09/14/2022 CLINICAL DATA:  Unwitnessed fall. EXAM: CT HEAD WITHOUT CONTRAST CT CERVICAL SPINE WITHOUT CONTRAST TECHNIQUE: Multidetector CT imaging of the head and  cervical spine was performed following the standard protocol without intravenous contrast. Multiplanar CT image reconstructions of the cervical spine were also generated. RADIATION DOSE REDUCTION: This exam was performed according to the departmental dose-optimization program which includes automated exposure control, adjustment of the mA and/or kV according to patient size and/or use of iterative reconstruction technique. COMPARISON:  None Available. FINDINGS: CT HEAD FINDINGS Brain: No evidence of acute infarction, hemorrhage, hydrocephalus, extra-axial collection or mass lesion/mass effect. Vascular: No hyperdense vessel or unexpected calcification. Skull: Normal. Negative for fracture or focal lesion. Sinuses/Orbits: No acute finding. Other: None. CT CERVICAL SPINE FINDINGS Alignment: Normal. Skull base and vertebrae: No acute fracture. No primary bone lesion or focal pathologic process. Soft tissues and spinal canal: No prevertebral fluid or swelling. No visible canal hematoma. Disc levels: Moderate degenerative disc disease is noted at C4-5 and C5-6. Upper chest: Negative. Other: None. IMPRESSION: No acute intracranial abnormality seen. Moderate multilevel degenerative disc disease. No acute abnormality seen in the cervical spine. Electronically Signed   By: Marijo Conception M.D.   On: 09/14/2022 13:50   CT Cervical Spine Wo Contrast  Result Date: 09/14/2022 CLINICAL DATA:  Unwitnessed fall. EXAM: CT HEAD WITHOUT CONTRAST CT CERVICAL SPINE WITHOUT CONTRAST TECHNIQUE: Multidetector CT imaging of the head and cervical spine was performed following the standard protocol without intravenous contrast. Multiplanar CT image reconstructions of the cervical spine were also generated. RADIATION DOSE REDUCTION: This exam was performed according to the departmental dose-optimization program which includes automated exposure control, adjustment of the mA and/or kV according to patient size and/or use of iterative  reconstruction technique. COMPARISON:  None Available. FINDINGS: CT HEAD FINDINGS Brain: No evidence of acute infarction, hemorrhage, hydrocephalus, extra-axial collection or mass lesion/mass effect. Vascular: No hyperdense vessel or unexpected calcification. Skull: Normal. Negative for fracture or focal lesion. Sinuses/Orbits: No acute finding. Other: None. CT CERVICAL SPINE FINDINGS Alignment: Normal. Skull base and vertebrae: No acute fracture. No primary bone lesion or focal pathologic process. Soft tissues and spinal canal: No prevertebral fluid or swelling. No visible canal hematoma. Disc levels: Moderate degenerative disc disease is noted at C4-5 and C5-6. Upper chest: Negative. Other: None. IMPRESSION: No acute intracranial abnormality seen. Moderate multilevel degenerative disc disease. No acute abnormality seen in the cervical spine. Electronically Signed   By: Marijo Conception M.D.   On: 09/14/2022 13:50   DG Chest Portable 1 View  Result Date: 09/14/2022 CLINICAL DATA:  Found down breathing heavily. EXAM: PORTABLE CHEST 1 VIEW COMPARISON:  Chest radiograph 09/25/2020 FINDINGS: The cardiomediastinal silhouette is normal. There is no focal consolidation or pulmonary edema. There is no pleural effusion or pneumothorax. There is no acute osseous abnormality. IMPRESSION: No radiographic evidence of acute cardiopulmonary process. Electronically Signed   By: Valetta Mole M.D.   On: 09/14/2022 13:23    Microbiology: Results for orders placed or performed during the hospital encounter of 04/23/21  SARS CORONAVIRUS 2 (TAT 6-24 HRS) Nasopharyngeal Nasopharyngeal Swab     Status: None   Collection Time: 04/23/21  11:55 AM   Specimen: Nasopharyngeal Swab  Result Value Ref Range Status   SARS Coronavirus 2 NEGATIVE NEGATIVE Final    Comment: (NOTE) SARS-CoV-2 target nucleic acids are NOT DETECTED.  The SARS-CoV-2 RNA is generally detectable in upper and lower respiratory specimens during the acute phase  of infection. Negative results do not preclude SARS-CoV-2 infection, do not rule out co-infections with other pathogens, and should not be used as the sole basis for treatment or other patient management decisions. Negative results must be combined with clinical observations, patient history, and epidemiological information. The expected result is Negative.  Fact Sheet for Patients: SugarRoll.be  Fact Sheet for Healthcare Providers: https://www.woods-mathews.com/  This test is not yet approved or cleared by the [place] FDA and  has been authorized for detection and/or diagnosis of SARS-CoV-2 by FDA under an Emergency Use Authorization (EUA). This EUA will remain  in effect (meaning this test can be used) for the duration of the COVID-19 declaration under Se ction 564(b)(1) of the Act, 21 U.S.C. section 360bbb-3(b)(1), unless the authorization is terminated or revoked sooner.  Performed at [facility], [place] [address], [place], [place] [address]     Labs: CBC: Recent Labs  Lab 09/14/22 1223 09/15/22 0500  WBC 9.3 5.9  HGB 11.8* 11.5*  HCT 35.9* 35.3*  MCV 92.3 92.7  PLT 176 245   Basic Metabolic Panel: Recent Labs  Lab 09/14/22 1223 09/15/22 0500  NA 135 140  K 3.6 3.2*  CL 102 109  CO2 24 27  GLUCOSE 142* 113*  BUN 12 6*  CREATININE 0.55 0.54  CALCIUM 8.4* 8.0*  MG  --  2.1   Liver Function Tests: Recent Labs  Lab 09/15/22 0500  AST 30  ALT 14  ALKPHOS 32*  BILITOT 1.5*  PROT 6.1*  ALBUMIN 3.3*   CBG: No results for input(s): "GLUCAP" in the last 168 hours.  Discharge time spent: less than 30 minutes.  Signed: Marylu Lund, MD [facility] 09/15/2022

## 2022-09-15 NOTE — Plan of Care (Signed)
Same day follow up chart review note  Patient seen and examined by Dr. Leonel Ramsay this morning.  I was asked to follow-up MRI and EEG.  EEG normal MRI with no acute findings  Given normal above studies, no need for antiepileptics. Outpatient neurology follow-up if she continues to have these episodes.  Please call with questions  Plan relayed to Dr. Wyline Copas    -- Amie Portland, MD Neurologist [facility] Pager: [phone]

## 2022-09-15 NOTE — Progress Notes (Signed)
Chaplain met with Kathleen Flores to provide support.  She has been through significant health challenges and she is tired.  Lately she has not been able to get out as much because of her health and this has been difficult for her. Chaplain provided support and prayer.  [address] [place] Pager, [phone]

## 2022-09-15 NOTE — Progress Notes (Signed)
EEG complete - results pending 

## 2022-09-15 NOTE — Procedures (Signed)
Patient Name: JACQUALYN SEDGWICK  MRN: [id number]  Epilepsy Attending: Lora Havens  Referring Physician/Provider: Kristopher Oppenheim, DO  Date: 09/15/2022 Duration: 26.24 mins  Patient history: 76 year old female with multiple episodes of syncope over the past few days. EEG to evaluate for seizure  Level of alertness: Awake  AEDs during EEG study: None  Technical aspects: This EEG study was done with scalp electrodes positioned according to the 10-20 International system of electrode placement. Electrical activity was reviewed with band pass filter of 1-'70Hz'$ , sensitivity of 7 uV/mm, display speed of 41m/sec with a '60Hz'$  notched filter applied as appropriate. EEG data were recorded continuously and digitally stored.  Video monitoring was available and reviewed as appropriate.  Description: The posterior dominant rhythm consists of 9 Hz activity of moderate voltage (25-35 uV) seen predominantly in posterior head regions, symmetric and reactive to eye opening and eye closing. Physiologic photic driving was seen during photic stimulation.  Hyperventilation was not performed.     IMPRESSION: This study is within normal limits. No seizures or epileptiform discharges were seen throughout the recording.  A normal interictal EEG does not exclude the diagnosis of epilepsy.   Velma Agnes OBarbra Sarks

## 2022-09-16 ENCOUNTER — Encounter: Payer: Self-pay | Admitting: Cardiovascular Disease

## 2022-09-21 ENCOUNTER — Other Ambulatory Visit: Payer: Self-pay | Admitting: *Deleted

## 2022-09-23 ENCOUNTER — Ambulatory Visit: Payer: PPO | Attending: Cardiovascular Disease

## 2022-09-23 ENCOUNTER — Telehealth: Payer: Self-pay | Admitting: *Deleted

## 2022-09-23 DIAGNOSIS — R55 Syncope and collapse: Secondary | ICD-10-CM

## 2022-09-23 NOTE — Telephone Encounter (Signed)
-----   Message from April Henson sent at 09/23/2022  9:47 AM EDT ----- Contact: [phone] Hello! Pt received heart monitor in mail today and would like a call back to discuss the steps to get it on. Requesting call back. Pt's # [phone]

## 2022-09-23 NOTE — Progress Notes (Unsigned)
Apply monitor [id number] that was mailed to patient on 09/16/22.

## 2022-09-23 NOTE — Telephone Encounter (Signed)
Scheduled to have monitor applied at [facility] office 09/23/2022, at 1:30 PM.

## 2022-10-27 ENCOUNTER — Ambulatory Visit: Payer: PPO | Attending: Cardiovascular Disease | Admitting: Cardiovascular Disease

## 2022-10-27 ENCOUNTER — Encounter: Payer: Self-pay | Admitting: Cardiovascular Disease

## 2022-10-27 VITALS — BP 190/85 | HR 94 | Ht 67.0 in | Wt 138.2 lb

## 2022-10-27 DIAGNOSIS — R55 Syncope and collapse: Secondary | ICD-10-CM

## 2022-10-27 MED ORDER — PROPRANOLOL HCL 10 MG PO TABS: 10.0000 mg | ORAL_TABLET | Freq: Four times a day (QID) | ORAL | 2 refills | Status: DC

## 2022-10-27 NOTE — Patient Instructions (Addendum)
Medication Instructions:  PROPRANOLOL 10 MG FORU TIMES A DAY AS NEEDED *If you need a refill on your cardiac medications before your next appointment, please call your pharmacy*   Lab Work: NONE If you have labs (blood work) drawn today and your tests are completely normal, you will receive your results only by: MyChart Message (if you have MyChart) OR A paper copy in the mail If you have any lab test that is abnormal or we need to change your treatment, we will call you to review the results.   Testing/Procedures: NONE   Follow-Up: At [facility], you and your health needs are our priority.  As part of our continuing mission to provide you with exceptional heart care, we have created designated Provider Care Teams.  These Care Teams include your primary Cardiologist (physician) and Advanced Practice Providers (APPs -  Physician Assistants and Nurse Practitioners) who all work together to provide you with the care you need, when you need it.  We recommend signing up for the patient portal called "MyChart".  Sign up information is provided on this After Visit Summary.  MyChart is used to connect with patients for Virtual Visits (Telemedicine).  Patients are able to view lab/test results, encounter notes, upcoming appointments, etc.  Non-urgent messages can be sent to your provider as well.   To learn more about what you can do with MyChart, go to [email or web address].    Your next appointment:   4 week(s) TO 6 WEEKS   The format for your next appointment:   In Person  Provider:DR NAHSER     Other Instructions NONE  Important Information About Sugar
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
## 2022-10-28 ENCOUNTER — Other Ambulatory Visit: Payer: Self-pay

## 2022-10-28 MED ORDER — PROPRANOLOL HCL 10 MG PO TABS: 10.0000 mg | ORAL_TABLET | Freq: Four times a day (QID) | ORAL | 11 refills | Status: DC

## 2022-11-05 ENCOUNTER — Encounter: Payer: Self-pay | Admitting: Cardiovascular Disease

## 2022-11-05 DIAGNOSIS — E039 Hypothyroidism, unspecified: Secondary | ICD-10-CM

## 2022-11-05 DIAGNOSIS — Z79899 Other long term (current) drug therapy: Secondary | ICD-10-CM

## 2022-11-09 ENCOUNTER — Encounter: Payer: Self-pay | Admitting: Cardiovascular Disease

## 2022-11-12 ENCOUNTER — Inpatient Hospital Stay: Payer: PPO | Admitting: Nurse Practitioner

## 2022-11-12 ENCOUNTER — Inpatient Hospital Stay: Payer: PPO | Attending: Hematology and Oncology

## 2022-11-12 ENCOUNTER — Encounter: Payer: Self-pay | Admitting: Nurse Practitioner

## 2022-11-12 VITALS — BP 182/79 | HR 98 | Temp 98.1°F | Resp 18 | Ht 67.0 in | Wt 138.0 lb

## 2022-11-12 DIAGNOSIS — Z923 Personal history of irradiation: Secondary | ICD-10-CM | POA: Insufficient documentation

## 2022-11-12 DIAGNOSIS — C20 Malignant neoplasm of rectum: Secondary | ICD-10-CM

## 2022-11-12 DIAGNOSIS — Z79810 Long term (current) use of selective estrogen receptor modulators (SERMs): Secondary | ICD-10-CM | POA: Diagnosis not present

## 2022-11-12 DIAGNOSIS — Z85048 Personal history of other malignant neoplasm of rectum, rectosigmoid junction, and anus: Secondary | ICD-10-CM | POA: Diagnosis present

## 2022-11-12 DIAGNOSIS — Z933 Colostomy status: Secondary | ICD-10-CM | POA: Insufficient documentation

## 2022-11-12 DIAGNOSIS — D0512 Intraductal carcinoma in situ of left breast: Secondary | ICD-10-CM

## 2022-11-12 DIAGNOSIS — Z79899 Other long term (current) drug therapy: Secondary | ICD-10-CM | POA: Diagnosis not present

## 2022-11-12 DIAGNOSIS — I10 Essential (primary) hypertension: Secondary | ICD-10-CM | POA: Insufficient documentation

## 2022-11-12 DIAGNOSIS — F419 Anxiety disorder, unspecified: Secondary | ICD-10-CM | POA: Insufficient documentation

## 2022-11-12 LAB — CEA (ACCESS): CEA (CHCC): <1 ng/mL (ref 0.00–5.00)
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
## 2022-11-15 NOTE — Telephone Encounter (Signed)
Pt sent follow-up readings today:  Kathleen Flores  P Cv Div Ch St Triage (supporting Thayer Headings, MD)3 hours ago (8:48 AM)   Kathleen Flores. Here are my BP numbers after starting Amolodipine 5 mg. On 11/07/22. 11/10/22 164/87 and 148/72 (2 hours later) 11/11/22 160/75 and 150/82 11/12/22 152/74 and 183/73 11/13/22 151/81 and 159/64 11/14/22 161/70 and 145/72 11/15/22 157/68 and 158/80 I have an appointment with Dr. Acie Fredrickson on Dec 19th.  Thank you for checking on me.  Kathleen Flores  Pt last seen in clinic on 10/27/22: 2.  [place] hypertension: She has profound whitecoat hypertension.  She states that her blood pressure is normal at home.  We will have her keep a blood pressure log.  She will bring her blood pressure cuff to the next office visit and will compare her cuff with our cuff.  Will route to MD for advisement on medication dosage. Pt at time of appt had stopped Amlodipine '5mg'$ , the above readings are with resuming it.

## 2022-11-17 MED ORDER — AMLODIPINE BESYLATE 5 MG PO TABS: 5.0000 mg | ORAL_TABLET | Freq: Every day | ORAL | 3 refills | Status: DC

## 2022-11-17 MED ORDER — SPIRONOLACTONE 25 MG PO TABS: 25.0000 mg | ORAL_TABLET | Freq: Every day | ORAL | 3 refills | Status: DC

## 2022-11-17 NOTE — Telephone Encounter (Signed)
Nahser, Wonda Cheng, MD  Donnalee Curry K It looks like we have restarted amlodipine 5 mg a day . I didn't see it in her med list.  Will you please add amlodipine 5 mg a day if she in fact is on it.  Blood pressure remains mildly elevated.  I see that her potassium is mostly in the low range. Will start spironolactone 25 mg a day.  I will check a basic metabolic profile when I see her again in 2 weeks.  PN  Patient confirmed that she is using Amlodipine '5mg'$  daily. Will add spironolactone '25mg'$  to [facility] and make patient aware via MyChart. BMET ordered and scheduled for same day as upcoming appt on 12/19.

## 2022-11-19 ENCOUNTER — Telehealth: Payer: Self-pay | Admitting: *Deleted

## 2022-11-19 NOTE — Telephone Encounter (Signed)
Received call from pt with complaint of hypertension, BP 414'E systolic and 39'R diastolic.  Pt requesting advice from MD if hypertension could be related to Tamoxifen.  Per MD pt to stop Tamoxifen x2 weeks and monitor BP 3 times a day to see if there is any difference.  Pt educated and verbalized understanding.

## 2022-11-29 NOTE — Addendum Note (Signed)
Addended by: Ma Hillock on: 11/29/2022 01:47 PM   Modules accepted: Orders

## 2022-11-30 ENCOUNTER — Other Ambulatory Visit: Payer: PPO

## 2022-11-30 ENCOUNTER — Ambulatory Visit: Payer: PPO | Admitting: Cardiovascular Disease

## 2022-12-01 ENCOUNTER — Encounter: Payer: Self-pay | Admitting: Cardiovascular Disease
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
## 2022-12-02 ENCOUNTER — Encounter: Payer: Self-pay | Admitting: Cardiovascular Disease

## 2022-12-02 ENCOUNTER — Ambulatory Visit: Payer: PPO

## 2022-12-02 ENCOUNTER — Ambulatory Visit: Payer: PPO | Attending: Cardiovascular Disease | Admitting: Cardiovascular Disease

## 2022-12-02 VITALS — BP 150/98 | HR 81 | Ht 67.0 in | Wt 136.0 lb

## 2022-12-02 DIAGNOSIS — Z79899 Other long term (current) drug therapy: Secondary | ICD-10-CM

## 2022-12-02 DIAGNOSIS — E039 Hypothyroidism, unspecified: Secondary | ICD-10-CM

## 2022-12-02 LAB — BASIC METABOLIC PANEL
BUN/Creatinine Ratio: 22 (ref 12–28)
BUN: 12 mg/dL (ref 8–27)
CO2: 25 mmol/L (ref 20–29)
Calcium: 9.9 mg/dL (ref 8.7–10.3)
Chloride: 99 mmol/L (ref 96–106)
Creatinine, Ser: 0.55 mg/dL — ABNORMAL LOW (ref 0.57–1.00)
Glucose: 105 mg/dL — ABNORMAL HIGH (ref 70–99)
Potassium: 4.2 mmol/L (ref 3.5–5.2)
Sodium: 138 mmol/L (ref 134–144)
eGFR: 96 mL/min/{1.73_m2} (ref >59)

## 2022-12-02 LAB — TSH: TSH: 0.541 u[IU]/mL (ref 0.450–4.500)

## 2022-12-02 MED ORDER — NEBIVOLOL HCL 5 MG PO TABS: 5.0000 mg | ORAL_TABLET | Freq: Every day | ORAL | 3 refills | Status: DC

## 2022-12-02 NOTE — Patient Instructions (Signed)
Medication Instructions:  START Nebivolol (Bystolic) '5mg'$  daily  *If you need a refill on your cardiac medications before your next appointment, please call your pharmacy*   Lab Work: TSH, BMET today If you have labs (blood work) drawn today and your tests are completely normal, you will receive your results only by: [place] (if you have MyChart) OR A paper copy in the mail If you have any lab test that is abnormal or we need to change your treatment, we will call you to review the results.   Testing/Procedures: NONE   Follow-Up: At [facility], you and your health needs are our priority.  As part of our continuing mission to provide you with exceptional heart care, we have created designated Provider Care Teams.  These Care Teams include your primary Cardiologist (physician) and Advanced Practice Providers (APPs -  Physician Assistants and Nurse Practitioners) who all work together to provide you with the care you need, when you need it.  We recommend signing up for the patient portal called "MyChart".  Sign up information is provided on this After Visit Summary.  MyChart is used to connect with patients for Virtual Visits (Telemedicine).  Patients are able to view lab/test results, encounter notes, upcoming appointments, etc.  Non-urgent messages can be sent to your provider as well.   To learn more about what you can do with MyChart, go to [email or web address].    Your next appointment:   3 month(s)  The format for your next appointment:   In Person  Provider:   Mertie Moores, MD    Important Information About Sugar

## 2022-12-03 ENCOUNTER — Telehealth: Payer: Self-pay | Admitting: Cardiovascular Disease

## 2022-12-03 NOTE — Telephone Encounter (Signed)
Returned call to patient. She didn't realize that the prescription was for 90 days' supply. Her cost is $48. She states she's okay with this.

## 2022-12-03 NOTE — Telephone Encounter (Signed)
Pt c/o medication issue:  1. Name of Medication:   nebivolol (BYSTOLIC) 5 MG tablet    2. How are you currently taking this medication (dosage and times per day)?   Take 1 tablet (5 mg total) by mouth daily.    3. Are you having a reaction (difficulty breathing--STAT)? No  4. What is your medication issue? Pt states that medication is too expensive and would like to know if an alternate is able to be prescribed. Please advise

## 2023-02-01 ENCOUNTER — Other Ambulatory Visit: Payer: Self-pay | Admitting: Adult Health

## 2023-02-01 DIAGNOSIS — Z1231 Encounter for screening mammogram for malignant neoplasm of breast: Secondary | ICD-10-CM

## 2023-02-18 NOTE — Progress Notes (Signed)
Patient Care Team: [name], FNP as PCP - General (Nurse Practitioner) Nahser, Wonda Cheng, MD as PCP - Cardiology (Cardiology) Michael Boston, MD as Consulting Physician (General Surgery) Milus Banister, MD as Consulting Physician (Gastroenterology) Ladell Pier, MD as Consulting Physician (Oncology) Rolm Bookbinder, MD as Consulting Physician (General Surgery) Nicholas Lose, MD as Consulting Physician (Hematology and Oncology) Kyung Rudd, MD as Consulting Physician (Radiation Oncology)  DIAGNOSIS: No diagnosis found.  SUMMARY OF ONCOLOGIC HISTORY: Oncology History  Ductal carcinoma in situ (DCIS) of left breast  04/07/2021 Initial Diagnosis   Screening mammogram showed a left breast mass. Diagnostic mammogram and US showed a 1.2cm mass at the 3 o'clock position in the left breast and no left axillary adenopathy. Biopsy showed low to intermediate grade DCIS, ER/PR+ 95%.    04/27/2021 Surgery   Left lumpectomy [name]): DCIS partially involving a papillary lesion, 1.8cm, clear margins.  ER 95%, PR 95% Additional inferior margin: 1.2 cm DCIS,  Additional left lateral margin: 1.4 cm DCIS    04/27/2021 Cancer Staging   Staging form: Breast, AJCC 8th Edition - Pathologic stage from 04/27/2021: Stage 0 (pTis (DCIS), pN0, cM0) - Signed by Gardenia Phlegm, NP on 08/20/2021 Stage prefix: Initial diagnosis   05/2021 -  Anti-estrogen oral therapy   Tamoxifen daily (at reduced dosage)     CHIEF COMPLIANT:   INTERVAL HISTORY: Kathleen Flores is a   ALLERGIES:  is allergic to metoprolol, statins, and demerol [meperidine hcl].  MEDICATIONS:  Current Outpatient Medications  Medication Sig Dispense Refill   amLODipine (NORVASC) 5 MG tablet Take 1 tablet (5 mg total) by mouth daily. 90 tablet 3   ascorbic acid (VITAMIN C) 500 MG tablet Take 500 mg by mouth daily.     Calcium Carb-Cholecalciferol (CALCIUM+D3) 600-800 MG-UNIT TABS Take 1 tablet by mouth daily.      Cyanocobalamin (VITAMIN B-12) 5000 MCG SUBL Place 5,000 mcg under the tongue daily.     diphenoxylate-atropine (LOMOTIL) 2.5-0.025 MG tablet Take 1 tablet 3 times daily as needed 90 tablet 1   levothyroxine (SYNTHROID) 112 MCG tablet Take 112 mcg by mouth daily before breakfast.     loperamide (IMODIUM) 2 MG capsule TAKE 1 CAPSULE BY MOUTH EVERY MORNING, 2 CAPSULES AT LUNCH, AND 1 CAPSULE AT NIGHT (Patient taking differently: 2-4 mg See admin instructions. Take 2 mg by mouth in the morning, 4 mg at lunch, and 2 mg at bedtime) 120 capsule 5   nebivolol (BYSTOLIC) 5 MG tablet Take 1 tablet (5 mg total) by mouth daily. 90 tablet 3   Ostomy Supplies (SENSURA DRAINABLE) Pouch MISC Use when changing pouching system     propranolol (INDERAL) 10 MG tablet Take 1 tablet (10 mg total) by mouth 4 (four) times daily. 120 tablet 11   spironolactone (ALDACTONE) 25 MG tablet Take 1 tablet (25 mg total) by mouth daily. 90 tablet 3   tamoxifen (NOLVADEX) 10 MG tablet TAKE ONE TABLET BY MOUTH DAILY 90 tablet 3   zinc gluconate 50 MG tablet Take 50 mg by mouth daily.     No current facility-administered medications for this visit.    PHYSICAL EXAMINATION: ECOG PERFORMANCE STATUS: {CHL ONC ECOG PS:[id number]}  There were no vitals filed for this visit. There were no vitals filed for this visit.  BREAST:*** No palpable masses or nodules in either right or left breasts. No palpable axillary supraclavicular or infraclavicular adenopathy no breast tenderness or nipple discharge. (exam performed in the presence of a chaperone)  LABORATORY DATA:  I have reviewed the data as listed    Latest Ref Rng & Units 12/02/2022   12:19 PM 09/15/2022    5:00 AM 09/14/2022   12:23 PM  CMP  Glucose 70 - 99 mg/dL 105  113  142   BUN 8 - 27 mg/dL '12  6  12   '$ Creatinine 0.57 - 1.00 mg/dL 0.55  0.54  0.55   Sodium 134 - 144 mmol/L 138  140  135   Potassium 3.5 - 5.2 mmol/L 4.2  3.2  3.6   Chloride 96 - 106 mmol/L 99  109  102    CO2 20 - 29 mmol/L '25  27  24   '$ Calcium 8.7 - 10.3 mg/dL 9.9  8.0  8.4   Total Protein 6.5 - 8.1 g/dL  6.1    Total Bilirubin 0.3 - 1.2 mg/dL  1.5    Alkaline Phos 38 - 126 U/L  32    AST 15 - 41 U/L  30    ALT 0 - 44 U/L  14      Lab Results  Component Value Date   WBC 5.9 09/15/2022   HGB 11.5 (L) 09/15/2022   HCT 35.3 (L) 09/15/2022   MCV 92.7 09/15/2022   PLT 167 09/15/2022   NEUTROABS 3.6 04/15/2021    ASSESSMENT & PLAN:  No problem-specific Assessment & Plan notes found for this encounter.    No orders of the defined types were placed in this encounter.  The patient has a good understanding of the overall plan. she agrees with it. she will call with any problems that may develop before the next visit here. Total time spent: 30 mins including face to face time and time spent for planning, charting and co-ordination of care   Suzzette Righter, [place] 02/18/23    I Gardiner Coins am acting as a Education administrator for [employer]  ***

## 2023-02-21 ENCOUNTER — Other Ambulatory Visit: Payer: Self-pay

## 2023-02-21 ENCOUNTER — Inpatient Hospital Stay: Payer: PPO | Attending: Hematology and Oncology | Admitting: Hematology and Oncology

## 2023-02-21 VITALS — BP 147/73 | HR 80 | Temp 97.2°F | Resp 18 | Ht 67.0 in | Wt 136.6 lb

## 2023-02-21 DIAGNOSIS — Z79810 Long term (current) use of selective estrogen receptor modulators (SERMs): Secondary | ICD-10-CM | POA: Diagnosis not present

## 2023-02-21 DIAGNOSIS — D0512 Intraductal carcinoma in situ of left breast: Secondary | ICD-10-CM | POA: Insufficient documentation

## 2023-02-21 MED ORDER — TAMOXIFEN CITRATE 10 MG PO TABS: 5.0000 mg | ORAL_TABLET | Freq: Every day | ORAL | 3 refills | Status: DC

## 2023-02-21 NOTE — Assessment & Plan Note (Addendum)
04/27/2021:Left lumpectomy [name]): DCIS partially involving a papillary lesion, 1.8cm, clear margins.  Additional inferior margin: 1.2 cm DCIS, additional left lateral margin: 1.4 cm DCIS ER 95%, PR 95%   Treatment plan: Patient does not want to radiation because of her prior bad experience for her rectal cancer.   Current treatment: Tamoxifen started 05/22/2021 at 5 mg daily (her biggest concern is the risk of stroke and heart attack in her family)   Tamoxifen toxicities:  Currently on 10 mg dosage and tolerating it well    Breast cancer surveillance: Breast exam 02/21/2023: Benign Mammogram scheduled for 03/22/2023   Severe hypertension: Blood pressure is improved currently since she was hospitalized with syncope and her meds were adjusted.   Return to clinic in 1 year for follow-up

## 2023-02-28 ENCOUNTER — Encounter: Payer: Self-pay | Admitting: Cardiovascular Disease
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
## 2023-03-01 ENCOUNTER — Ambulatory Visit: Payer: PPO | Attending: Cardiovascular Disease | Admitting: Cardiovascular Disease

## 2023-03-01 ENCOUNTER — Encounter: Payer: Self-pay | Admitting: Cardiovascular Disease

## 2023-03-01 VITALS — BP 130/80 | HR 68 | Ht 67.0 in | Wt 138.2 lb

## 2023-03-01 DIAGNOSIS — R55 Syncope and collapse: Secondary | ICD-10-CM | POA: Diagnosis not present

## 2023-03-01 DIAGNOSIS — I10 Essential (primary) hypertension: Secondary | ICD-10-CM | POA: Diagnosis not present

## 2023-03-01 NOTE — Patient Instructions (Signed)
Medication Instructions:  Your physician recommends that you continue on your current medications as directed. Please refer to the Current Medication list given to you today.  *If you need a refill on your cardiac medications before your next appointment, please call your pharmacy*   Lab Work: NONE If you have labs (blood work) drawn today and your tests are completely normal, you will receive your results only by: MyChart Message (if you have MyChart) OR A paper copy in the mail If you have any lab test that is abnormal or we need to change your treatment, we will call you to review the results.   Testing/Procedures: NONE   Follow-Up: At [facility], you and your health needs are our priority.  As part of our continuing mission to provide you with exceptional heart care, we have created designated Provider Care Teams.  These Care Teams include your primary Cardiologist (physician) and Advanced Practice Providers (APPs -  Physician Assistants and Nurse Practitioners) who all work together to provide you with the care you need, when you need it.  We recommend signing up for the patient portal called "MyChart".  Sign up information is provided on this After Visit Summary.  MyChart is used to connect with patients for Virtual Visits (Telemedicine).  Patients are able to view lab/test results, encounter notes, upcoming appointments, etc.  Non-urgent messages can be sent to your provider as well.   To learn more about what you can do with MyChart, go to [email or web address].    Your next appointment:   6 month(s)  Provider:   Philip Nahser, MD      

## 2023-03-21 ENCOUNTER — Other Ambulatory Visit: Payer: Self-pay | Admitting: Gastroenterology

## 2023-03-21 NOTE — Telephone Encounter (Signed)
Per phone call with patient she is using 3 to 5 Lomotil tablets per day.  She is a patient of Dr Christella Hartigan.  Please advise refills as you are DOD AM.    Thank you, Joni Reining

## 2023-03-21 NOTE — Telephone Encounter (Signed)
Ok to refill 

## 2023-03-22 ENCOUNTER — Other Ambulatory Visit: Payer: Self-pay | Admitting: Adult Health

## 2023-03-22 ENCOUNTER — Ambulatory Visit
Admission: RE | Admit: 2023-03-22 | Discharge: 2023-03-22 | Disposition: A | Discharge status: Home / Self Care | Payer: PPO | Source: Ambulatory Visit | Attending: Adult Health | Admitting: Adult Health

## 2023-03-22 DIAGNOSIS — Z1231 Encounter for screening mammogram for malignant neoplasm of breast: Secondary | ICD-10-CM

## 2023-03-22 DIAGNOSIS — Z9889 Other specified postprocedural states: Secondary | ICD-10-CM

## 2023-04-07 ENCOUNTER — Ambulatory Visit
Admission: RE | Admit: 2023-04-07 | Discharge: 2023-04-07 | Disposition: A | Discharge status: Home / Self Care | Payer: PPO | Source: Ambulatory Visit | Attending: Adult Health | Admitting: Adult Health

## 2023-04-07 DIAGNOSIS — Z9889 Other specified postprocedural states: Secondary | ICD-10-CM

## 2023-05-18 ENCOUNTER — Telehealth: Payer: Self-pay | Admitting: Hematology and Oncology

## 2023-05-18 NOTE — Telephone Encounter (Signed)
Scheduled appointment per staff message. Patient is aware of the made appointment. 

## 2023-05-20 ENCOUNTER — Inpatient Hospital Stay: Payer: PPO | Attending: Hematology and Oncology

## 2023-05-20 ENCOUNTER — Telehealth: Payer: Self-pay | Admitting: Nurse Practitioner

## 2023-05-20 ENCOUNTER — Inpatient Hospital Stay (HOSPITAL_BASED_OUTPATIENT_CLINIC_OR_DEPARTMENT_OTHER): Payer: PPO | Admitting: Oncology

## 2023-05-20 VITALS — BP 169/65 | HR 76 | Temp 98.2°F | Resp 18 | Ht 67.0 in | Wt 136.4 lb

## 2023-05-20 DIAGNOSIS — Z85048 Personal history of other malignant neoplasm of rectum, rectosigmoid junction, and anus: Secondary | ICD-10-CM | POA: Diagnosis not present

## 2023-05-20 DIAGNOSIS — R197 Diarrhea, unspecified: Secondary | ICD-10-CM | POA: Insufficient documentation

## 2023-05-20 DIAGNOSIS — Z923 Personal history of irradiation: Secondary | ICD-10-CM | POA: Insufficient documentation

## 2023-05-20 DIAGNOSIS — Z933 Colostomy status: Secondary | ICD-10-CM | POA: Diagnosis not present

## 2023-05-20 DIAGNOSIS — Z9221 Personal history of antineoplastic chemotherapy: Secondary | ICD-10-CM | POA: Diagnosis not present

## 2023-05-20 DIAGNOSIS — C20 Malignant neoplasm of rectum: Secondary | ICD-10-CM

## 2023-05-20 DIAGNOSIS — D0512 Intraductal carcinoma in situ of left breast: Secondary | ICD-10-CM

## 2023-05-20 LAB — CEA (ACCESS): CEA (CHCC): 1.08 ng/mL (ref 0.00–5.00)
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
## 2023-05-20 NOTE — Telephone Encounter (Signed)
Inbound call from patient requesting a call back to discuss colonoscopy. States she was due in May for her next one. She states she would like to speak with a nurse regarding concerns for the colonoscopy before she scheduled. Please advise, thank you.

## 2023-05-23 NOTE — Telephone Encounter (Signed)
I spoke with the pt and she prefers to have a follow up with a female provider to discuss colon.  She is a previous Dr Christella Hartigan pt and has a lot of concerns she wants to discuss.  Appt made to see Dr Leonides Schanz. Pt aware

## 2023-06-23 ENCOUNTER — Encounter: Payer: Self-pay | Admitting: Internal Medicine

## 2023-06-24 MED ORDER — DIPHENOXYLATE-ATROPINE 2.5-0.025 MG PO TABS: ORAL_TABLET | ORAL | 1 refills | Status: DC

## 2023-07-25 ENCOUNTER — Other Ambulatory Visit: Payer: Self-pay

## 2023-07-25 MED ORDER — LOPERAMIDE HCL 2 MG PO CAPS: ORAL_CAPSULE | ORAL | 3 refills | Status: DC

## 2023-07-30 ENCOUNTER — Other Ambulatory Visit: Payer: Self-pay | Admitting: Hematology and Oncology

## 2023-08-01 ENCOUNTER — Encounter: Payer: Self-pay | Admitting: Internal Medicine

## 2023-08-01 ENCOUNTER — Ambulatory Visit: Payer: PPO | Admitting: Internal Medicine

## 2023-08-01 VITALS — BP 130/70 | HR 77 | Ht 67.0 in | Wt 138.0 lb

## 2023-08-01 DIAGNOSIS — Z933 Colostomy status: Secondary | ICD-10-CM

## 2023-08-01 DIAGNOSIS — Z85048 Personal history of other malignant neoplasm of rectum, rectosigmoid junction, and anus: Secondary | ICD-10-CM | POA: Diagnosis not present

## 2023-08-01 MED ORDER — DIPHENOXYLATE-ATROPINE 2.5-0.025 MG PO TABS: ORAL_TABLET | ORAL | 1 refills | Status: AC

## 2023-08-01 NOTE — Patient Instructions (Addendum)
We have sent the following medications to your pharmacy for you to pick up at your convenience: Lomotil  Follow up in 6 months    If your blood pressure at your visit was 140/90 or greater, please contact your primary care physician to follow up on this.  _______________________________________________________  If you are age 76 or older, your body mass index should be between 23-30. Your Body mass index is 21.61 kg/m. If this is out of the aforementioned range listed, please consider follow up with your Primary Care Provider.  If you are age 42 or younger, your body mass index should be between 19-25. Your Body mass index is 21.61 kg/m. If this is out of the aformentioned range listed, please consider follow up with your Primary Care Provider.   ________________________________________________________  The  GI providers would like to encourage you to use [facility] to communicate with providers for non-urgent requests or questions.  Due to long hold times on the telephone, sending your provider a message by [facility] may be a faster and more efficient way to get a response.  Please allow 48 business hours for a response.  Please remember that this is for non-urgent requests.  _______________________________________________________   Thank you for entrusting me with your care and for choosing [facility],  Dr. Eulah Pont
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
## 2023-08-01 NOTE — Telephone Encounter (Signed)
Per last OV, continue tamoxifen. Gardiner Rhyme, RN

## 2023-08-29 ENCOUNTER — Encounter: Payer: Self-pay | Admitting: Cardiovascular Disease
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
## 2023-08-30 ENCOUNTER — Encounter: Payer: Self-pay | Admitting: Cardiovascular Disease

## 2023-08-30 ENCOUNTER — Ambulatory Visit: Payer: PPO | Attending: Cardiovascular Disease | Admitting: Cardiovascular Disease

## 2023-08-30 VITALS — BP 120/70 | HR 54 | Ht 67.0 in | Wt 137.0 lb

## 2023-08-30 DIAGNOSIS — I10 Essential (primary) hypertension: Secondary | ICD-10-CM | POA: Diagnosis not present

## 2023-08-30 NOTE — Patient Instructions (Signed)
Medication Instructions:   Your physician recommends that you continue on your current medications as directed. Please refer to the Current Medication list given to you today.  *If you need a refill on your cardiac medications before your next appointment, please call your pharmacy*     Follow-Up: At [facility], you and your health needs are our priority.  As part of our continuing mission to provide you with exceptional heart care, we have created designated Provider Care Teams.  These Care Teams include your primary Cardiologist (physician) and Advanced Practice Providers (APPs -  Physician Assistants and Nurse Practitioners) who all work together to provide you with the care you need, when you need it.  We recommend signing up for the patient portal called "MyChart".  Sign up information is provided on this After Visit Summary.  MyChart is used to connect with patients for Virtual Visits (Telemedicine).  Patients are able to view lab/test results, encounter notes, upcoming appointments, etc.  Non-urgent messages can be sent to your provider as well.   To learn more about what you can do with MyChart, go to [email or web address].    Your next appointment:   1 year(s)  Provider:   Kristeen Miss, MD

## 2023-09-02 ENCOUNTER — Ambulatory Visit: Payer: PPO | Admitting: Cardiovascular Disease

## 2023-10-21 ENCOUNTER — Other Ambulatory Visit: Payer: Self-pay | Admitting: Cardiology

## 2023-11-16 ENCOUNTER — Other Ambulatory Visit: Payer: Self-pay | Admitting: Cardiology

## 2023-11-24 ENCOUNTER — Other Ambulatory Visit: Payer: Self-pay

## 2023-11-24 DIAGNOSIS — Z79899 Other long term (current) drug therapy: Secondary | ICD-10-CM

## 2023-11-24 DIAGNOSIS — E039 Hypothyroidism, unspecified: Secondary | ICD-10-CM

## 2023-11-24 MED ORDER — NEBIVOLOL HCL 5 MG PO TABS: 5.0000 mg | ORAL_TABLET | Freq: Every day | ORAL | 2 refills | Status: DC

## 2023-12-23 ENCOUNTER — Other Ambulatory Visit: Payer: Self-pay | Admitting: Internal Medicine

## 2024-01-05 ENCOUNTER — Encounter: Payer: Self-pay | Admitting: Internal Medicine

## 2024-02-28 ENCOUNTER — Other Ambulatory Visit: Payer: Self-pay | Admitting: Nurse Practitioner

## 2024-02-28 ENCOUNTER — Other Ambulatory Visit: Payer: Self-pay | Admitting: General Surgery

## 2024-02-28 DIAGNOSIS — Z1231 Encounter for screening mammogram for malignant neoplasm of breast: Secondary | ICD-10-CM

## 2024-04-09 ENCOUNTER — Ambulatory Visit
Admission: RE | Admit: 2024-04-09 | Discharge: 2024-04-09 | Disposition: A | Discharge status: Home / Self Care | Source: Ambulatory Visit | Attending: Nurse Practitioner | Admitting: Nurse Practitioner

## 2024-04-09 DIAGNOSIS — Z1231 Encounter for screening mammogram for malignant neoplasm of breast: Secondary | ICD-10-CM

## 2024-04-11 ENCOUNTER — Ambulatory Visit: Payer: PPO | Admitting: Internal Medicine

## 2024-04-11 ENCOUNTER — Encounter: Payer: Self-pay | Admitting: Internal Medicine

## 2024-04-11 VITALS — BP 110/70 | HR 64 | Ht 67.0 in | Wt 146.6 lb

## 2024-04-11 DIAGNOSIS — Z85048 Personal history of other malignant neoplasm of rectum, rectosigmoid junction, and anus: Secondary | ICD-10-CM

## 2024-04-11 DIAGNOSIS — K9409 Other complications of colostomy: Secondary | ICD-10-CM | POA: Diagnosis not present

## 2024-04-11 DIAGNOSIS — Z933 Colostomy status: Secondary | ICD-10-CM

## 2024-04-11 MED ORDER — COLESTIPOL HCL 1 G PO TABS: 4.0000 g | ORAL_TABLET | Freq: Every day | ORAL | 3 refills | Status: DC
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
## 2024-04-11 NOTE — Patient Instructions (Addendum)
 Return in 1 year.  Due to recent changes in healthcare laws, you may see the results of your imaging and laboratory studies on MyChart before your provider has had a chance to review them.  We understand that in some cases there may be results that are confusing or concerning to you. Not all laboratory results come back in the same time frame and the provider may be waiting for multiple results in order to interpret others.  Please give us  48 hours in order for your provider to thoroughly review all the results before contacting the office for clarification of your results.   It was a pleasure to see you today!  Thank you for trusting me with your gastrointestinal care!    Garth Kansky, MD

## 2024-04-16 ENCOUNTER — Inpatient Hospital Stay: Payer: PPO | Attending: Hematology and Oncology | Admitting: Hematology and Oncology

## 2024-04-16 VITALS — BP 130/84 | HR 79 | Temp 98.6°F | Resp 18 | Ht 67.0 in | Wt 145.4 lb

## 2024-04-16 DIAGNOSIS — Z933 Colostomy status: Secondary | ICD-10-CM | POA: Diagnosis not present

## 2024-04-16 DIAGNOSIS — Z170 Estrogen receptor positive status [ER+]: Secondary | ICD-10-CM | POA: Insufficient documentation

## 2024-04-16 DIAGNOSIS — D0512 Intraductal carcinoma in situ of left breast: Secondary | ICD-10-CM | POA: Diagnosis not present

## 2024-04-16 DIAGNOSIS — I10 Essential (primary) hypertension: Secondary | ICD-10-CM | POA: Insufficient documentation

## 2024-04-16 DIAGNOSIS — Z85038 Personal history of other malignant neoplasm of large intestine: Secondary | ICD-10-CM | POA: Diagnosis not present

## 2024-04-16 MED ORDER — TAMOXIFEN CITRATE 10 MG PO TABS: 5.0000 mg | ORAL_TABLET | Freq: Every day | ORAL | Status: AC

## 2024-04-16 NOTE — Progress Notes (Signed)
 Patient Care Team: Yvonnie Heritage, FNP as PCP - General (Nurse Practitioner) Nahser, Lela Purple, MD as PCP - Cardiology (Cardiology) Candyce Champagne, MD as Consulting Physician (General Surgery) Janel Medford, MD (Inactive) as Consulting Physician (Gastroenterology) Enid Harry, MD as Consulting Physician (General Surgery) Cameron Cea, MD as Consulting Physician (Hematology and Oncology) Johna Myers, MD as Consulting Physician (Radiation Oncology)  DIAGNOSIS:  Encounter Diagnosis  Name Primary?   Ductal carcinoma in situ (DCIS) of left breast Yes    SUMMARY OF ONCOLOGIC HISTORY: Oncology History  Ductal carcinoma in situ (DCIS) of left breast  04/07/2021 Initial Diagnosis   Screening mammogram showed a left breast mass. Diagnostic mammogram and US  showed a 1.2cm mass at the 3 o'clock position in the left breast and no left axillary adenopathy. Biopsy showed low to intermediate grade DCIS, ER/PR+ 95%.    04/27/2021 Surgery   Left lumpectomy Delane Fear): DCIS partially involving a papillary lesion, 1.8cm, clear margins.  ER 95%, PR 95% Additional inferior margin: 1.2 cm DCIS,  Additional left lateral margin: 1.4 cm DCIS    04/27/2021 Cancer Staging   Staging form: Breast, AJCC 8th Edition - Pathologic stage from 04/27/2021: Stage 0 (pTis (DCIS), pN0, cM0) - Signed by Percival Brace, NP on 08/20/2021 Stage prefix: Initial diagnosis   05/2021 -  Anti-estrogen oral therapy   Tamoxifen  daily (at reduced dosage)     CHIEF COMPLIANT: Follow-up on tamoxifen  therapy for DCIS  HISTORY OF PRESENT ILLNESS:   History of Present Illness Kathleen Flores is a 78 year old female with a history of colon cancer and breast cancer who presents for follow-up regarding her cancer treatment and medication management.  Her recent mammogram returned normal results. She continues tamoxifen  at a reduced dose of 5 mg without adverse effects.  Her colon cancer was treated with  radiation therapy, resulting in a non-functioning colon and a colostomy. She manages bowel symptoms with Imodium , taken two to four times daily, and Lomotil  as needed. Colestipol  was recently prescribed to assist with her symptoms. Her colon cancer has been in remission for five years.     ALLERGIES:  is allergic to metoprolol , statins, and demerol  [meperidine  hcl].  MEDICATIONS:  Current Outpatient Medications  Medication Sig Dispense Refill   amLODipine  (NORVASC ) 5 MG tablet TAKE 1 TABLET BY MOUTH DAILY 90 tablet 3   ascorbic acid  (VITAMIN C ) 500 MG tablet Take 500 mg by mouth daily.     Calcium  Carb-Cholecalciferol  (CALCIUM +D3) 600-800 MG-UNIT TABS Take 1 tablet by mouth daily.     colestipol  (COLESTID ) 1 g tablet Take 4 tablets (4 g total) by mouth daily. 360 tablet 3   Cyanocobalamin  (VITAMIN B-12) 5000 MCG SUBL Place 5,000 mcg under the tongue daily.     diphenoxylate -atropine  (LOMOTIL ) 2.5-0.025 MG tablet Take 1 tablet 3 times daily as needed 90 tablet 1   levothyroxine  (SYNTHROID ) 112 MCG tablet Take 112 mcg by mouth daily before breakfast.     loperamide  (IMODIUM ) 2 MG capsule TAKE 1 CAPSULE BY MOUTH EVERY MORNING; 2 CAPSULES AT LUNCH; AND 1 CAPSULE AT NIGHT 120 capsule 3   nebivolol  (BYSTOLIC ) 5 MG tablet Take 1 tablet (5 mg total) by mouth daily. 90 tablet 2   Ostomy Supplies (SENSURA DRAINABLE) Pouch MISC Use when changing pouching system     potassium chloride  (KLOR-CON ) 10 MEQ tablet Take 10 mEq by mouth 2 (two) times daily. Home supply. Pt states unsure of dosage.     spironolactone  (ALDACTONE ) 25 MG tablet  TAKE 1 TABLET BY MOUTH DAILY 90 tablet 3   tamoxifen  (NOLVADEX ) 10 MG tablet Take 0.5 tablets (5 mg total) by mouth daily.     zinc  gluconate 50 MG tablet Take 50 mg by mouth daily.     No current facility-administered medications for this visit.    PHYSICAL EXAMINATION: ECOG PERFORMANCE STATUS: 1 - Symptomatic but completely ambulatory  Vitals:   04/16/24 0859  BP:  130/84  Pulse: 79  Resp: 18  Temp: 98.6 F (37 C)  SpO2: 95%   Filed Weights   04/16/24 0859  Weight: 145 lb 6.4 oz (66 kg)    Physical Exam No palpable lumps or nodules in bilateral breasts or axilla  (exam performed in the presence of a chaperone)  LABORATORY DATA:  I have reviewed the data as listed    Latest Ref Rng & Units 12/02/2022   12:19 PM 09/15/2022    5:00 AM 09/14/2022   12:23 PM  CMP  Glucose 70 - 99 mg/dL 161  096  045   BUN 8 - 27 mg/dL 12  6  12    Creatinine 0.57 - 1.00 mg/dL 4.09  8.11  9.14   Sodium 134 - 144 mmol/L 138  140  135   Potassium 3.5 - 5.2 mmol/L 4.2  3.2  3.6   Chloride 96 - 106 mmol/L 99  109  102   CO2 20 - 29 mmol/L 25  27  24    Calcium  8.7 - 10.3 mg/dL 9.9  8.0  8.4   Total Protein 6.5 - 8.1 g/dL  6.1    Total Bilirubin 0.3 - 1.2 mg/dL  1.5    Alkaline Phos 38 - 126 U/L  32    AST 15 - 41 U/L  30    ALT 0 - 44 U/L  14      Lab Results  Component Value Date   WBC 5.9 09/15/2022   HGB 11.5 (L) 09/15/2022   HCT 35.3 (L) 09/15/2022   MCV 92.7 09/15/2022   PLT 167 09/15/2022   NEUTROABS 3.6 04/15/2021    ASSESSMENT & PLAN:  Ductal carcinoma in situ (DCIS) of left breast 04/27/2021:Left lumpectomy Delane Fear): DCIS partially involving a papillary lesion, 1.8cm, clear margins.  Additional inferior margin: 1.2 cm DCIS, additional left lateral margin: 1.4 cm DCIS ER 95%, PR 95%   Treatment plan: Patient does not want to radiation because of her prior bad experience for her rectal cancer.   Current treatment: Tamoxifen  started 05/22/2021 at 10 mg daily (her biggest concern is the risk of stroke and heart attack in her family)   Tamoxifen  toxicities:  Currently on 5 mg dosage and tolerating it well    Breast cancer surveillance: Breast exam 04/16/2024: Benign Mammogram 04/10/2024: Benign breast density category B   Severe hypertension: Blood pressure is improved currently since she was hospitalized with syncope and her meds were  adjusted.   Return to clinic in 1 year for follow-up ------------------------------------- Assessment and Plan Assessment & Plan Colon cancer in remission Colon cancer in remission for five years without recurrence symptoms. Diarrhea managed with colestipol  due to colostomy and prior radiation. - Continue colestipol  for diarrhea management. Colostomy status post extensive radiation therapy for colon diagnosed in 2020 under the care of Dr. Sharalyn Dasen Colostomy post extensive radiation therapy. Diarrhea managed with Imodium , Lomotil , and colestipol . - Continue Imodium  and Lomotil  as needed. - Continue colestipol  as prescribed by GI specialist. - Follow up with GI specialist as needed for colostomy  management.   Return to clinic in 1 year for follow-up   No orders of the defined types were placed in this encounter.  The patient has a good understanding of the overall plan. she agrees with it. she will call with any problems that may develop before the next visit here. Total time spent: 30 mins including face to face time and time spent for planning, charting and co-ordination of care   Viinay K Jeremie Giangrande, MD 04/16/24

## 2024-04-16 NOTE — Assessment & Plan Note (Signed)
 04/27/2021:Left lumpectomy Delane Fear): DCIS partially involving a papillary lesion, 1.8cm, clear margins.  Additional inferior margin: 1.2 cm DCIS, additional left lateral margin: 1.4 cm DCIS ER 95%, PR 95%   Treatment plan: Patient does not want to radiation because of her prior bad experience for her rectal cancer.   Current treatment: Tamoxifen  started 05/22/2021 at 5 mg daily (her biggest concern is the risk of stroke and heart attack in her family)   Tamoxifen  toxicities:  Currently on 10 mg dosage and tolerating it well    Breast cancer surveillance: Breast exam 04/16/2024: Benign Mammogram 04/10/2024: Benign breast density category B   Severe hypertension: Blood pressure is improved currently since she was hospitalized with syncope and her meds were adjusted.   Return to clinic in 1 year for follow-up

## 2024-04-27 ENCOUNTER — Other Ambulatory Visit: Payer: Self-pay

## 2024-04-27 MED ORDER — LOPERAMIDE HCL 2 MG PO CAPS: ORAL_CAPSULE | ORAL | 3 refills | Status: DC

## 2024-05-18 ENCOUNTER — Ambulatory Visit: Payer: PPO | Admitting: Oncology

## 2024-05-18 ENCOUNTER — Other Ambulatory Visit: Payer: Self-pay

## 2024-05-18 ENCOUNTER — Other Ambulatory Visit: Payer: PPO

## 2024-05-25 ENCOUNTER — Encounter: Payer: Self-pay | Admitting: Oncology

## 2024-05-25 ENCOUNTER — Other Ambulatory Visit: Payer: Self-pay

## 2024-05-25 ENCOUNTER — Inpatient Hospital Stay: Payer: PPO | Admitting: Oncology

## 2024-05-25 ENCOUNTER — Inpatient Hospital Stay: Payer: PPO | Attending: Hematology and Oncology

## 2024-05-25 VITALS — BP 142/74 | HR 74 | Temp 98.0°F | Resp 18 | Ht 67.0 in | Wt 145.7 lb

## 2024-05-25 DIAGNOSIS — D0512 Intraductal carcinoma in situ of left breast: Secondary | ICD-10-CM

## 2024-05-25 DIAGNOSIS — I10 Essential (primary) hypertension: Secondary | ICD-10-CM | POA: Diagnosis not present

## 2024-05-25 DIAGNOSIS — Z85048 Personal history of other malignant neoplasm of rectum, rectosigmoid junction, and anus: Secondary | ICD-10-CM | POA: Diagnosis present

## 2024-05-25 DIAGNOSIS — Z79810 Long term (current) use of selective estrogen receptor modulators (SERMs): Secondary | ICD-10-CM | POA: Diagnosis not present

## 2024-05-25 DIAGNOSIS — C20 Malignant neoplasm of rectum: Secondary | ICD-10-CM | POA: Diagnosis not present

## 2024-05-25 DIAGNOSIS — F419 Anxiety disorder, unspecified: Secondary | ICD-10-CM | POA: Insufficient documentation

## 2024-05-25 LAB — CEA (ACCESS): CEA (CHCC): 1.23 ng/mL (ref 0.00–5.00)

## 2024-05-25 NOTE — Progress Notes (Signed)
 [facility] OFFICE PROGRESS NOTE   Diagnosis: Rectal cancer  INTERVAL HISTORY:   Kathleen Flores returns as scheduled.  She generally feels well.  She empties the ostomy bag frequently.  The stool is sometimes formed.  She continues tamoxifen .  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  Objective:  Vital signs in last 24 hours:  Blood pressure (!) 142/74, pulse 74, temperature 98 F (36.7 C), temperature source Temporal, resp. rate 18, height 5' 7 (1.702 m), weight 145 lb 11.2 oz (66.1 kg), SpO2 100%.    Lymphatics: No cervical, supraclavicular, axillary, or inguinal nodes Resp: Lungs clear bilaterally Cardio: Regular rate and rhythm with premature beats GI: No hepatosplenomegaly, no mass, nontender, left lower quadrant colostomy Vascular: No leg edema   Lab Results:  Lab Results  Component Value Date   WBC 5.9 09/15/2022   HGB 11.5 (L) 09/15/2022   HCT 35.3 (L) 09/15/2022   MCV 92.7 09/15/2022   PLT 167 09/15/2022   NEUTROABS 3.6 04/15/2021    CMP  Lab Results  Component Value Date   NA 138 12/02/2022   K 4.2 12/02/2022   CL 99 12/02/2022   CO2 25 12/02/2022   GLUCOSE 105 (H) 12/02/2022   BUN 12 12/02/2022   CREATININE 0.55 (L) 12/02/2022   CALCIUM  9.9 12/02/2022   PROT 6.1 (L) 09/15/2022   ALBUMIN 3.3 (L) 09/15/2022   AST 30 09/15/2022   ALT 14 09/15/2022   ALKPHOS 32 (L) 09/15/2022   BILITOT 1.5 (H) 09/15/2022   GFRNONAA >60 09/15/2022   GFRAA >60 05/14/2020    Lab Results  Component Value Date   CEA1 1.07 05/12/2021   CEA 1.23 05/25/2024    Medications: I have reviewed the patient's current medications.   Assessment/Plan: Rectal cancer Nonobstructing mass in the mid rectum on colonoscopy 02/28/2019, 7 cm from the anal verge, biopsy confirmed adenocarcinoma-at least intramucosal CTs 03/05/2019- eccentric soft tissue thickening at the low rectum, multiple tiny lung nodules-nonspecific, 1 cm left adrenal nodule, no definite evidence of  metastatic disease MR pelvis 03/08/2019- tumor at 6.5 cm from the internal anal sphincter, T3b,N1 (single 7 mm right perirectal lymph node) Radiation/Xeloda  03/19/2019-04/27/2019, Xeloda  completed 04/25/2019 CT abdomen/pelvis 06/22/2019- dilated ileum with diffuse wall thickening and mucosal enhancement, no rectal tumor seen, 0.9 cm right lower quadrant ileocolic node, no pelvic or inguinal adenopathy Low anterior resection, ileocecectomy, and diverting loop ileostomy 07/12/2019,[id number] well-differentiated adenocarcinoma of the rectum, 1/22 lymph nodes positive, one tumor deposit, negative margins Cycle 1 Xeloda  08/20/2019 Cycle 2 Xeloda  09/10/2019 Cycle 3 Xeloda  09/29/2019 Cycle 4 Xeloda  10/18/2019, placed on hold beginning 10/22/2019 due to hand-foot syndrome, Xeloda  resumed 10/23/2019 at a reduced dose of 1000 mg twice daily for the remainder of the cycle, discontinued 1116 secondary to burning of the hands and feet Cycle 5 Xeloda  11/06/19-dose reduced to 500 mg twice daily (discontinued after 7 days due to hand-foot syndrome) Ileostomy reversal 12/10/2019 Colonoscopy 04/16/2020- right colon ileocolonic anastomosis normal, colorectal anastomosis at 1 cm from anal verge with a 5-6 mm deep ulcer- biopsy of ulcer with no adenomatous change or carcinoma CTs 05/07/2020-contrast opacified presacral air/fluid collection communicating to the posterior rectum by fistula tract, unchanged tiny pulmonary nodules, no evidence of metastatic disease CT pelvis 09/03/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 Howard Macho  and Dr. 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  DCIS focally less than 0.1 cm from the medial margin, 0.2 cm from the final inferior margin, and 0.1 cm from the final lateral margin Tamoxifen  June 2022       Disposition: Kathleen Flores is in clinical remission from rectal cancer.  She would like continued follow-up with the Cancer center.  She will return for an office visit and CEA in 9 months.  She does not feel comfortable having a surveillance colonoscopy.  She will continue follow-up with Dr. Gudena for management of DCIS.  Coni Deep, MD  05/25/2024  11:02 AM

## 2024-08-02 NOTE — Telephone Encounter (Signed)
 Patient called stating her DME company faxed an order for ostomy supplies to us .   Spoke to patient, 2 pt identifiers verified, I stated since we have not seen patient since 09/16/22 that her Pcp should be signing off on these orders. I stated to have the DME company send to PCP for them to sign. She stated understanding.

## 2024-08-09 ENCOUNTER — Other Ambulatory Visit: Payer: Self-pay | Admitting: Internal Medicine

## 2024-08-10 ENCOUNTER — Other Ambulatory Visit: Payer: Self-pay | Admitting: Cardiology

## 2024-08-10 DIAGNOSIS — E039 Hypothyroidism, unspecified: Secondary | ICD-10-CM

## 2024-08-10 DIAGNOSIS — Z79899 Other long term (current) drug therapy: Secondary | ICD-10-CM

## 2024-08-10 MED ORDER — NEBIVOLOL HCL 5 MG PO TABS: 5.0000 mg | ORAL_TABLET | Freq: Every day | ORAL | 0 refills | Status: DC

## 2024-09-04 NOTE — Progress Notes (Unsigned)
 Cardiology Office Note:  .   Date:  09/05/2024  ID:  Kathleen Flores, DOB 11/16/1946, MRN [id number] PCP: Kathleen Flores  [facility] Providers Cardiologist:  Gordy Bergamo, MD   History of Present Illness: Kathleen   Kathleen Flores is a 78 y.o. Caucasian female patient with hypertension, history of colorectal cancer and breast cancer, presents for annual visit, previous patient of Dr. Aleene Passe.  She remains asymptomatic and occasionally feels palpitations.  Cardiac Studies relevent.    ECHOCARDIOGRAM COMPLETE 09/15/2022  1. Left ventricular ejection fraction, by estimation, is 65 to 70%. The left ventricle has normal function. The left ventricle has no regional wall motion abnormalities. There is mild concentric left ventricular hypertrophy. Left ventricular diastolic parameters are consistent with Grade I diastolic dysfunction (impaired relaxation). 2. Right ventricular systolic function is normal. The right ventricular size is normal. There is normal pulmonary artery systolic pressure. The estimated right ventricular systolic pressure is 24.6 mmHg.   CARDIAC EVENT MONITOR 09/23/2022    Predominately sinus rhythm - including normal sinus rhythm, sinus bradycardia, sinus tachycardia   Frequent premature ventricular contractions (PVC burden 1%)   No significant arrhythmias observed   Discussed the use of AI scribe software for clinical note transcription with the patient, who gave verbal consent to proceed.  History of Present Illness Kathleen Flores is a 78 year old female who presents for a cardiovascular evaluation and management of hypertension. She has hypertension with 'white coat syndrome,' causing elevated blood pressure in clinical settings. At home, her blood pressure ranges from 124/58 to 130/73, with occasional readings up to 146/71. She takes amlodipine  and spironolactone  25 mg daily.  She experiences occasional palpitations described as 'skipping' mostly at  rest. An echocardiogram two years ago and a monitor showed normal heart function and some PVCs.  She experienced muscle aches and joint pain with previous statin use, leading to discontinuation. She is not currently on cholesterol-lowering medications. Recent blood work showed HDL 64 and LDL 100.  She has a history of colon cancer in 2020 and breast cancer in 2022, treated with radiation therapy to the pelvis, resulting in a colostomy. She takes [place] potassium capsules, two daily, due to high colostomy output.  She maintains an active lifestyle, walking twice daily, and follows a careful diet. She does not smoke and is not diabetic. No new cardiovascular concerns or symptoms with physical activity.  Labs   Care everywhere/Faxed External Labs:  Labs 08/29/2024:  TSH normal at 1.890.  Total cholesterol 189, triglycerides 144, HDL 64, LDL 100.  Serum glucose 100 mg, BUN 13, creatinine 0.72, eGFR 86 mL, potassium 4.4.  Hb 12.1/HCT 37.8, platelets 183.  ROS  Review of Systems  Cardiovascular:  Negative for chest pain, dyspnea on exertion and leg swelling.   Physical Exam:   VS:  BP (!) 197/82 (BP Location: Left Arm, Patient Position: Sitting, Cuff Size: Normal)   Pulse 73   Resp 16   Ht 5' 7 (1.702 m)   Wt 144 lb (65.3 kg)   SpO2 97%   BMI 22.55 kg/m    Wt Readings from Last 3 Encounters:  09/05/24 144 lb (65.3 kg)  05/25/24 145 lb 11.2 oz (66.1 kg)  04/16/24 145 lb 6.4 oz (66 kg)    BP Readings from Last 3 Encounters:  09/05/24 (!) 197/82  05/25/24 (!) 142/74  04/16/24 130/84   Physical Exam Neck:     Vascular: No JVD.  Cardiovascular:  Rate and Rhythm: Normal rate and regular rhythm.     Pulses: Intact distal pulses.     Heart sounds: S1 normal and S2 normal. Murmur heard.     Early systolic murmur is present with a grade of 2/6 at the upper right sternal border.     No gallop.  Pulmonary:     Effort: Pulmonary effort is normal.     Breath sounds: Normal  breath sounds.  Abdominal:     General: The ostomy site is clean. Bowel sounds are normal.     Palpations: Abdomen is soft.  Musculoskeletal:     Right lower leg: No edema.     Left lower leg: No edema.    EKG:    EKG Interpretation Date/Time:  Wednesday September 05 2024 09:18:09 EDT Ventricular Rate:  74 PR Interval:  216 QRS Duration:  94 QT Interval:  376 QTC Calculation: 417 R Axis:   54  Text Interpretation: EKG 09/05/2024: Sinus rhythm with first-degree AV block at rate of 74 bpm, incomplete right bundle branch block otherwise normal EKG.  Compared to 09/14/2022, first-degree block     Confirmed by Melvine Julin, Jagadeesh ([id number]) on 09/05/2024 9:45:09 AM    ASSESSMENT AND PLAN: .      ICD-10-CM   1. Primary hypertension  I10 EKG 12-Lead    spironolactone  (ALDACTONE ) 50 MG tablet    Basic metabolic panel with GFR    2. White coat syndrome with diagnosis of hypertension  I10     3. Pure hypercholesterolemia  E78.00     4. Aortic atherosclerosis  I70.0     5. Hypokalemia  E87.6 spironolactone  (ALDACTONE ) 50 MG tablet    Basic metabolic panel with GFR    Potassium 99 MG TABS     Assessment & Plan Essential hypertension Blood pressure is well-controlled at home, typically 120s-130s systolic. White coat syndrome present. Current medications include amlodipine  5 mg daily and spironolactone   25 mg daily.  Increasing spironolactone  to 50 mg or even up to 100 mg may manage hypokalemia and improve blood pressure control, potentially reducing the need for potassium supplements. - Increase spironolactone  to 50 mg once daily - Monitor blood pressure regularly - Check BMP in two weeks to assess potassium levels and kidney function - If no change in kidney function, potassium levels are normalizing, could continue 50 mg otherwise schedule and uptitrate to 100 mg as tolerated daily.  Hypokalemia in the setting of high-output colostomy High-output colostomy causing potassium loss.  Currently on over-the-counter potassium supplements. Increasing spironolactone  may aid potassium retention, allowing reduction of potassium supplements. - Increase spironolactone  to 50 mg once daily - Reduce potassium supplement to one capsule daily ([place] potassium  99 mgcapsules) - Check BMP in two weeks to assess potassium levels  Hypercholesterolemia with aortic atherosclerotic plaque Cholesterol levels are good with HDL at 64 and LDL at 100. Aortic calcification on CT scans from 2021 and 2023 indicates plaque buildup. Statins caused muscle and joint pain. Discussed alternatives, including Zetia and red yeast rice. She prefers red yeast rice, which may reduce cholesterol by at least 10 points. - Start red yeast rice, one capsule daily, increase to two if tolerated  Benign premature ventricular contractions Asymptomatic PVCs occur mostly at rest and are considered benign.  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 Previous echocardiogram showed normal heart function with mild diastolic dysfunction.   Follow up: PRN  Signed,  Gordy Bergamo, MD, FACC 09/05/2024,  8:18 PM [facility] [address] [place], [place] [address] Phone: [phone]. Fax:  [phone]

## 2024-09-05 ENCOUNTER — Encounter: Payer: Self-pay | Admitting: Cardiology

## 2024-09-05 ENCOUNTER — Ambulatory Visit: Attending: Cardiology | Admitting: Cardiology

## 2024-09-05 VITALS — BP 197/82 | HR 73 | Resp 16 | Ht 67.0 in | Wt 144.0 lb

## 2024-09-05 DIAGNOSIS — I700 Atherosclerosis of aorta: Secondary | ICD-10-CM | POA: Diagnosis not present

## 2024-09-05 DIAGNOSIS — E7800 Pure hypercholesterolemia, unspecified: Secondary | ICD-10-CM

## 2024-09-05 DIAGNOSIS — E876 Hypokalemia: Secondary | ICD-10-CM

## 2024-09-05 DIAGNOSIS — I10 Essential (primary) hypertension: Secondary | ICD-10-CM

## 2024-09-05 MED ORDER — SPIRONOLACTONE 50 MG PO TABS: 50.0000 mg | ORAL_TABLET | Freq: Every day | ORAL | Status: AC

## 2024-09-05 MED ORDER — POTASSIUM 99 MG PO TABS: 99.0000 mg | ORAL_TABLET | Freq: Every day | ORAL | Status: AC

## 2024-09-05 NOTE — Patient Instructions (Addendum)
 Medication Instructions:  Increased Spironolactone  50 mg daily   *If you need a refill on your cardiac medications before your next appointment, please call your pharmacy*   Lab Work: BMP in 3 weeks If you have labs (blood work) drawn today and your tests are completely normal, you will receive your results only by: MyChart Message (if you have MyChart) OR A paper copy in the mail If you have any lab test that is abnormal or we need to change your treatment, we will call you to review the results.   Testing/Procedures:  Not needed  Follow-Up: At [facility], you and your health needs are our priority.  As part of our continuing mission to provide you with exceptional heart care, we have created designated Provider Care Teams.  These Care Teams include your primary Cardiologist (physician) and Advanced Practice Providers (APPs -  Physician Assistants and Nurse Practitioners) who all work together to provide you with the care you need, when you need it.     Your next appointment:    As needed  The format for your next appointment:   In Person  Provider:   Gordy Bergamo, MD

## 2024-09-18 ENCOUNTER — Encounter: Payer: Self-pay | Admitting: Hematology and Oncology

## 2024-09-22 ENCOUNTER — Encounter: Payer: Self-pay | Admitting: Cardiology

## 2024-09-26 ENCOUNTER — Ambulatory Visit: Payer: Self-pay | Admitting: Cardiology

## 2024-09-26 LAB — BASIC METABOLIC PANEL WITH GFR
BUN/Creatinine Ratio: 15 (ref 12–28)
BUN: 13 mg/dL (ref 8–27)
CO2: 25 mmol/L (ref 20–29)
Calcium: 9.5 mg/dL (ref 8.7–10.3)
Chloride: 102 mmol/L (ref 96–106)
Creatinine, Ser: 0.84 mg/dL (ref 0.57–1.00)
Glucose: 104 mg/dL — ABNORMAL HIGH (ref 70–99)
Potassium: 4.3 mmol/L (ref 3.5–5.2)
Sodium: 139 mmol/L (ref 134–144)
eGFR: 72 mL/min/1.73 (ref >59)

## 2024-09-26 NOTE — Progress Notes (Signed)
 Normal labs with stable kidney function and potassium levels.  Continue present medical management.

## 2024-10-10 ENCOUNTER — Other Ambulatory Visit: Payer: Self-pay

## 2024-10-10 DIAGNOSIS — E876 Hypokalemia: Secondary | ICD-10-CM

## 2024-10-10 DIAGNOSIS — I10 Essential (primary) hypertension: Secondary | ICD-10-CM

## 2024-10-12 ENCOUNTER — Encounter: Payer: Self-pay | Admitting: Cardiology

## 2024-11-12 ENCOUNTER — Other Ambulatory Visit: Payer: Self-pay | Admitting: Physician Assistant

## 2024-11-12 DIAGNOSIS — Z79899 Other long term (current) drug therapy: Secondary | ICD-10-CM

## 2024-11-12 DIAGNOSIS — E039 Hypothyroidism, unspecified: Secondary | ICD-10-CM

## 2024-11-15 MED ORDER — AMLODIPINE BESYLATE 5 MG PO TABS: 5.0000 mg | ORAL_TABLET | Freq: Every day | ORAL | 2 refills | Status: AC

## 2025-02-25 ENCOUNTER — Ambulatory Visit: Admitting: Oncology

## 2025-02-25 ENCOUNTER — Other Ambulatory Visit

## 2025-04-17 ENCOUNTER — Inpatient Hospital Stay: Admitting: Hematology and Oncology
